# Patient Record
Sex: Female | Born: 1935
Health system: Southern US, Community
[De-identification: ages and names within clinical notes are randomized; demographics above are authoritative.]

## PROBLEM LIST (undated history)

## (undated) ENCOUNTER — Emergency Department (HOSPITAL_COMMUNITY): Admission: EM | Payer: Self-pay | Source: Home / Self Care

## (undated) DIAGNOSIS — F0390 Unspecified dementia without behavioral disturbance: Secondary | ICD-10-CM

## (undated) DIAGNOSIS — I10 Essential (primary) hypertension: Secondary | ICD-10-CM

## (undated) DIAGNOSIS — E119 Type 2 diabetes mellitus without complications: Secondary | ICD-10-CM

---

## 2007-06-05 ENCOUNTER — Emergency Department (HOSPITAL_COMMUNITY): Admission: EM | Admit: 2007-06-05 | Discharge: 2007-06-05 | Payer: Self-pay | Admitting: Family Medicine

## 2008-12-24 ENCOUNTER — Emergency Department (HOSPITAL_COMMUNITY): Admission: EM | Admit: 2008-12-24 | Discharge: 2008-12-24 | Payer: Self-pay | Admitting: Emergency Medicine

## 2009-01-11 ENCOUNTER — Encounter: Admission: RE | Admit: 2009-01-11 | Discharge: 2009-01-11 | Payer: Self-pay | Admitting: Family Medicine

## 2010-02-04 ENCOUNTER — Ambulatory Visit: Payer: Self-pay | Admitting: Vascular Surgery

## 2010-05-13 ENCOUNTER — Ambulatory Visit: Payer: Self-pay | Admitting: Vascular Surgery

## 2011-01-08 LAB — DIFFERENTIAL
Basophils Absolute: 0 10*3/uL (ref 0.0–0.1)
Basophils Relative: 0 % (ref 0–1)
Eosinophils Absolute: 0.1 10*3/uL (ref 0.0–0.7)
Monocytes Relative: 8 % (ref 3–12)
Neutrophils Relative %: 69 % (ref 43–77)

## 2011-01-08 LAB — COMPREHENSIVE METABOLIC PANEL
ALT: 12 U/L (ref 0–35)
Alkaline Phosphatase: 66 U/L (ref 39–117)
BUN: 35 mg/dL — ABNORMAL HIGH (ref 6–23)
CO2: 26 mEq/L (ref 19–32)
Chloride: 102 mEq/L (ref 96–112)
Glucose, Bld: 263 mg/dL — ABNORMAL HIGH (ref 70–99)
Potassium: 4.1 mEq/L (ref 3.5–5.1)
Sodium: 134 mEq/L — ABNORMAL LOW (ref 135–145)
Total Bilirubin: 0.9 mg/dL (ref 0.3–1.2)
Total Protein: 6.3 g/dL (ref 6.0–8.3)

## 2011-01-08 LAB — URINALYSIS, ROUTINE W REFLEX MICROSCOPIC
Bilirubin Urine: NEGATIVE
Hgb urine dipstick: NEGATIVE
Nitrite: POSITIVE — AB
Protein, ur: NEGATIVE mg/dL
Urobilinogen, UA: 1 mg/dL (ref 0.0–1.0)

## 2011-01-08 LAB — CBC
HCT: 39.3 % (ref 36.0–46.0)
Hemoglobin: 13.3 g/dL (ref 12.0–15.0)
RBC: 3.93 MIL/uL (ref 3.87–5.11)
RDW: 16.5 % — ABNORMAL HIGH (ref 11.5–15.5)
WBC: 7.1 10*3/uL (ref 4.0–10.5)

## 2011-02-10 NOTE — Consult Note (Signed)
NEW PATIENT CONSULTATION   Nicole Blake, Nicole Blake  DOB:  14-Feb-1915                                       02/04/2010  MVHQI#:[id number]   The patient is a 77 year old female referred by Dr. Suzette Battiest for  nonhealing ulcer on the left fourth toe.  The patient has had a hammer  toe left foot for many years and apparently has had this ulceration for  the past few months which has not healed.  It is unclear whether this  was caused by shoes which were tight or what other mechanisms.  She has  had no history of infection or gangrene of either lower extremity.  She  does ambulate a small amount while at home but is somewhat unsteady.   CHRONIC MEDICAL PROBLEMS:  1. Type 2 diabetes mellitus.  2. Hypertension.  3. Hammertoe left foot.  4. 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  5. Negative for coronary artery disease, COPD, hyperlipidemia or      stroke.   FAMILY HISTORY:  Negative for coronary artery disease, diabetes and  stroke.   SOCIAL HISTORY:  She has no children.  She is a retired Advertising copywriter.  Does not use tobacco or alcohol.   REVIEW OF SYSTEMS:  Negative for weight loss, anorexia, chest pain.  She  does have some dyspnea on exertion.  Does not ambulate long distances.  Denies any bronchitis, wheezing, GI or GU symptoms.  Does have blindness  in the left eye from her diabetes and had a failed laser treatment.  Decreased vision and hearing.  All other systems in review of systems  are negative.   PHYSICAL EXAM:  Vital signs:  Blood pressure 146/71, heart rate 59,  respirations 19.  General:  She is an elderly female in no apparent  distress.  She is alert and oriented x3.  HEENT:  Exam is normal.  EOMs  intact.  Chest:  Is clear to auscultation.  No rhonchi or wheezing.  Cardiovascular:  Regular rhythm.  No murmurs.  Carotid pulses 3+.  No  bruits.  Abdomen:  Soft, nontender with no masses.  Musculoskeletal:  Exam is free of major deformities.   Neurologic:  Exam had decreased  sensation in both feet.  Skin:  Free of rashes with the exception of the  ulcer on the left fourth toe.  Lower extremity exam reveals 3+ femoral,  popliteal pulse on the right and 3+ femoral pulse on the left.  Both  feet are adequately perfused.  The right foot has some superficial  abrasion over the right first metatarsal head medially.  The left foot  has an ulcer measuring about 8 mm in diameter on the anterior aspect of  the fourth toe which is a hammer toe and this has a superficial eschar  overlying it which was noninfected.  There is no surrounding cellulitis.  No other ulcers between the toes on the left foot.   I reviewed the clinical records provided by [facility] and Dr. Suzette Battiest and also reviewed the lower extremity arterial  Doppler report and pulse volume recordings done and other laboratory on  April 19 of this year.  Her ABI on the left is 0.7 and on the right is  1.28.   I think the patient does have left superficial femoral occlusive disease  with decreased  circulation of the left foot but not severe enough to  cause rest ischemia or limb loss.  I would not be inclined to do any  minor debridement or procedures on the left fourth toe as long as it  remains infected but rather treat this with padding of the foot.  Attempt at toe amputation may lead to failure to heal this and  ultimately could lead to an amputation of the extremity conceivably.  She is 77 years old and I would try to relieve her pain by other  symptomatic means such as padding of the shoes.     Quita Skye Hart Rochester, M.D.  Electronically Signed   JDL/MEDQ  D:  02/04/2010  T:  02/05/2010  Job:  [id number]   cc:   Dr Luciana Axe  Dr Suzette Battiest

## 2011-02-10 NOTE — Assessment & Plan Note (Signed)
OFFICE VISIT   [name], Nicole Blake  DOB:  01/14/1915                                       05/13/2010  EAVWU#:[id number]   The patient returns today having been evaluated by me for vascular  occlusive disease on May 10 of this year.  She was found to have tibial  occlusive disease with superficial ulcer on her left fourth toe, some  irritation along the medial aspect of her right first toe.  She has  received some treatments as [facility] and I am not sure  specifically what those consist of.  She has had no minor surgical  procedures.  She continues to have some aching discomfort in her feet  but states the feet have healed.  She has been avoiding any extrinsic  pressure to hopefully avoid pressure sores as we discussed at the last  visit.   PHYSICAL EXAM:  Today her blood pressure is 182/84, heart rate is 58,  respirations 16.  She continues to have 2+ femoral, 2+ popliteal pulses  bilaterally.  Both feet are adequately perfused.  The ulceration on the  anterior aspect of the left fourth toe has healed and the medial aspect  of the right first toe has also healed with no ulceration or infection.   Lower extremity arterial Doppler studies were ordered by me today and  reviewed.  ABI on the left is 1.21 and on the right is 0.93 with  evidence of calcified tibial vessels with biphasic flow.  She does not  have any need for further evaluation from a vascular standpoint.  I  would certainly avoid any type of toe amputations or incisions on her  feet at this may not heal and lead to an amputation.  I do not think she  requires any further treatments for her circulation and would be happy  to see her again on a p.r.n. basis.     Quita Skye Hart [place], M.D.  Electronically Signed   JDL/MEDQ  D:  05/13/2010  T:  05/14/2010  Job:  [id number]   cc:   Sherlynn Stalls

## 2011-10-08 DIAGNOSIS — I729 Aneurysm of unspecified site: Secondary | ICD-10-CM | POA: Diagnosis not present

## 2011-10-08 DIAGNOSIS — E1159 Type 2 diabetes mellitus with other circulatory complications: Secondary | ICD-10-CM | POA: Diagnosis not present

## 2011-10-08 DIAGNOSIS — L608 Other nail disorders: Secondary | ICD-10-CM | POA: Diagnosis not present

## 2011-11-10 DIAGNOSIS — T1510XA Foreign body in conjunctival sac, unspecified eye, initial encounter: Secondary | ICD-10-CM | POA: Diagnosis not present

## 2011-11-10 DIAGNOSIS — H4011X0 Primary open-angle glaucoma, stage unspecified: Secondary | ICD-10-CM | POA: Diagnosis not present

## 2011-12-29 DIAGNOSIS — H9190 Unspecified hearing loss, unspecified ear: Secondary | ICD-10-CM | POA: Diagnosis not present

## 2011-12-29 DIAGNOSIS — I10 Essential (primary) hypertension: Secondary | ICD-10-CM | POA: Diagnosis not present

## 2011-12-29 DIAGNOSIS — E1149 Type 2 diabetes mellitus with other diabetic neurological complication: Secondary | ICD-10-CM | POA: Diagnosis not present

## 2011-12-29 DIAGNOSIS — E782 Mixed hyperlipidemia: Secondary | ICD-10-CM | POA: Diagnosis not present

## 2012-01-25 DIAGNOSIS — R05 Cough: Secondary | ICD-10-CM | POA: Diagnosis not present

## 2012-02-02 DIAGNOSIS — H4011X0 Primary open-angle glaucoma, stage unspecified: Secondary | ICD-10-CM | POA: Diagnosis not present

## 2012-02-02 DIAGNOSIS — H2510 Age-related nuclear cataract, unspecified eye: Secondary | ICD-10-CM | POA: Diagnosis not present

## 2012-02-05 DIAGNOSIS — J209 Acute bronchitis, unspecified: Secondary | ICD-10-CM | POA: Diagnosis not present

## 2012-04-21 DIAGNOSIS — M79609 Pain in unspecified limb: Secondary | ICD-10-CM | POA: Diagnosis not present

## 2012-04-21 DIAGNOSIS — E1159 Type 2 diabetes mellitus with other circulatory complications: Secondary | ICD-10-CM | POA: Diagnosis not present

## 2012-04-21 DIAGNOSIS — B351 Tinea unguium: Secondary | ICD-10-CM | POA: Diagnosis not present

## 2012-04-21 DIAGNOSIS — I739 Peripheral vascular disease, unspecified: Secondary | ICD-10-CM | POA: Diagnosis not present

## 2012-04-21 DIAGNOSIS — L84 Corns and callosities: Secondary | ICD-10-CM | POA: Diagnosis not present

## 2012-04-26 DIAGNOSIS — H2510 Age-related nuclear cataract, unspecified eye: Secondary | ICD-10-CM | POA: Diagnosis not present

## 2012-04-26 DIAGNOSIS — H4011X0 Primary open-angle glaucoma, stage unspecified: Secondary | ICD-10-CM | POA: Diagnosis not present

## 2012-06-28 DIAGNOSIS — E1149 Type 2 diabetes mellitus with other diabetic neurological complication: Secondary | ICD-10-CM | POA: Diagnosis not present

## 2012-06-28 DIAGNOSIS — J309 Allergic rhinitis, unspecified: Secondary | ICD-10-CM | POA: Diagnosis not present

## 2012-06-28 DIAGNOSIS — G609 Hereditary and idiopathic neuropathy, unspecified: Secondary | ICD-10-CM | POA: Diagnosis not present

## 2012-06-28 DIAGNOSIS — I10 Essential (primary) hypertension: Secondary | ICD-10-CM | POA: Diagnosis not present

## 2012-06-28 DIAGNOSIS — Z23 Encounter for immunization: Secondary | ICD-10-CM | POA: Diagnosis not present

## 2012-07-14 DIAGNOSIS — L608 Other nail disorders: Secondary | ICD-10-CM | POA: Diagnosis not present

## 2012-07-14 DIAGNOSIS — I739 Peripheral vascular disease, unspecified: Secondary | ICD-10-CM | POA: Diagnosis not present

## 2012-07-14 DIAGNOSIS — E1159 Type 2 diabetes mellitus with other circulatory complications: Secondary | ICD-10-CM | POA: Diagnosis not present

## 2012-07-26 DIAGNOSIS — H2510 Age-related nuclear cataract, unspecified eye: Secondary | ICD-10-CM | POA: Diagnosis not present

## 2012-07-26 DIAGNOSIS — H4011X0 Primary open-angle glaucoma, stage unspecified: Secondary | ICD-10-CM | POA: Diagnosis not present

## 2012-10-10 DIAGNOSIS — I739 Peripheral vascular disease, unspecified: Secondary | ICD-10-CM | POA: Diagnosis not present

## 2012-10-10 DIAGNOSIS — E1159 Type 2 diabetes mellitus with other circulatory complications: Secondary | ICD-10-CM | POA: Diagnosis not present

## 2012-10-10 DIAGNOSIS — L608 Other nail disorders: Secondary | ICD-10-CM | POA: Diagnosis not present

## 2012-10-28 DIAGNOSIS — R059 Cough, unspecified: Secondary | ICD-10-CM | POA: Diagnosis not present

## 2012-10-28 DIAGNOSIS — R05 Cough: Secondary | ICD-10-CM | POA: Diagnosis not present

## 2012-11-16 DIAGNOSIS — H3530 Unspecified macular degeneration: Secondary | ICD-10-CM | POA: Diagnosis not present

## 2012-12-27 DIAGNOSIS — E1149 Type 2 diabetes mellitus with other diabetic neurological complication: Secondary | ICD-10-CM | POA: Diagnosis not present

## 2012-12-27 DIAGNOSIS — H9190 Unspecified hearing loss, unspecified ear: Secondary | ICD-10-CM | POA: Diagnosis not present

## 2012-12-27 DIAGNOSIS — I10 Essential (primary) hypertension: Secondary | ICD-10-CM | POA: Diagnosis not present

## 2012-12-27 DIAGNOSIS — J309 Allergic rhinitis, unspecified: Secondary | ICD-10-CM | POA: Diagnosis not present

## 2013-01-02 DIAGNOSIS — E1159 Type 2 diabetes mellitus with other circulatory complications: Secondary | ICD-10-CM | POA: Diagnosis not present

## 2013-01-02 DIAGNOSIS — L608 Other nail disorders: Secondary | ICD-10-CM | POA: Diagnosis not present

## 2013-01-02 DIAGNOSIS — I739 Peripheral vascular disease, unspecified: Secondary | ICD-10-CM | POA: Diagnosis not present

## 2013-03-15 DIAGNOSIS — H4011X0 Primary open-angle glaucoma, stage unspecified: Secondary | ICD-10-CM | POA: Diagnosis not present

## 2013-03-23 DIAGNOSIS — L608 Other nail disorders: Secondary | ICD-10-CM | POA: Diagnosis not present

## 2013-03-23 DIAGNOSIS — E1159 Type 2 diabetes mellitus with other circulatory complications: Secondary | ICD-10-CM | POA: Diagnosis not present

## 2013-03-23 DIAGNOSIS — I739 Peripheral vascular disease, unspecified: Secondary | ICD-10-CM | POA: Diagnosis not present

## 2013-05-20 DIAGNOSIS — J069 Acute upper respiratory infection, unspecified: Secondary | ICD-10-CM | POA: Diagnosis not present

## 2013-05-22 DIAGNOSIS — J309 Allergic rhinitis, unspecified: Secondary | ICD-10-CM | POA: Diagnosis not present

## 2013-05-22 DIAGNOSIS — G609 Hereditary and idiopathic neuropathy, unspecified: Secondary | ICD-10-CM | POA: Diagnosis not present

## 2013-05-22 DIAGNOSIS — R0989 Other specified symptoms and signs involving the circulatory and respiratory systems: Secondary | ICD-10-CM | POA: Diagnosis not present

## 2013-05-28 DIAGNOSIS — R05 Cough: Secondary | ICD-10-CM | POA: Diagnosis not present

## 2013-05-28 DIAGNOSIS — J45909 Unspecified asthma, uncomplicated: Secondary | ICD-10-CM | POA: Diagnosis not present

## 2013-07-04 DIAGNOSIS — Z23 Encounter for immunization: Secondary | ICD-10-CM | POA: Diagnosis not present

## 2013-07-12 DIAGNOSIS — H4011X0 Primary open-angle glaucoma, stage unspecified: Secondary | ICD-10-CM | POA: Diagnosis not present

## 2013-07-13 DIAGNOSIS — L608 Other nail disorders: Secondary | ICD-10-CM | POA: Diagnosis not present

## 2013-07-13 DIAGNOSIS — E1159 Type 2 diabetes mellitus with other circulatory complications: Secondary | ICD-10-CM | POA: Diagnosis not present

## 2013-07-13 DIAGNOSIS — I739 Peripheral vascular disease, unspecified: Secondary | ICD-10-CM | POA: Diagnosis not present

## 2013-10-24 DIAGNOSIS — L608 Other nail disorders: Secondary | ICD-10-CM | POA: Diagnosis not present

## 2013-10-24 DIAGNOSIS — I739 Peripheral vascular disease, unspecified: Secondary | ICD-10-CM | POA: Diagnosis not present

## 2013-11-15 DIAGNOSIS — H4011X0 Primary open-angle glaucoma, stage unspecified: Secondary | ICD-10-CM | POA: Diagnosis not present

## 2013-11-26 DIAGNOSIS — M79609 Pain in unspecified limb: Secondary | ICD-10-CM | POA: Diagnosis not present

## 2013-12-01 DIAGNOSIS — M25519 Pain in unspecified shoulder: Secondary | ICD-10-CM | POA: Diagnosis not present

## 2013-12-01 DIAGNOSIS — E119 Type 2 diabetes mellitus without complications: Secondary | ICD-10-CM | POA: Diagnosis not present

## 2013-12-18 DIAGNOSIS — M25519 Pain in unspecified shoulder: Secondary | ICD-10-CM | POA: Diagnosis not present

## 2014-01-02 DIAGNOSIS — M25519 Pain in unspecified shoulder: Secondary | ICD-10-CM | POA: Diagnosis not present

## 2014-01-08 DIAGNOSIS — M25519 Pain in unspecified shoulder: Secondary | ICD-10-CM | POA: Diagnosis not present

## 2014-01-09 DIAGNOSIS — L608 Other nail disorders: Secondary | ICD-10-CM | POA: Diagnosis not present

## 2014-01-09 DIAGNOSIS — I739 Peripheral vascular disease, unspecified: Secondary | ICD-10-CM | POA: Diagnosis not present

## 2014-01-09 DIAGNOSIS — M25519 Pain in unspecified shoulder: Secondary | ICD-10-CM | POA: Diagnosis not present

## 2014-01-10 DIAGNOSIS — M25519 Pain in unspecified shoulder: Secondary | ICD-10-CM | POA: Diagnosis not present

## 2014-01-15 DIAGNOSIS — Z5189 Encounter for other specified aftercare: Secondary | ICD-10-CM | POA: Diagnosis not present

## 2014-01-15 DIAGNOSIS — M129 Arthropathy, unspecified: Secondary | ICD-10-CM | POA: Diagnosis not present

## 2014-01-15 DIAGNOSIS — I10 Essential (primary) hypertension: Secondary | ICD-10-CM | POA: Diagnosis not present

## 2014-01-15 DIAGNOSIS — M25519 Pain in unspecified shoulder: Secondary | ICD-10-CM | POA: Diagnosis not present

## 2014-01-15 DIAGNOSIS — M6281 Muscle weakness (generalized): Secondary | ICD-10-CM | POA: Diagnosis not present

## 2014-01-15 DIAGNOSIS — F0390 Unspecified dementia without behavioral disturbance: Secondary | ICD-10-CM | POA: Diagnosis not present

## 2014-01-16 DIAGNOSIS — M129 Arthropathy, unspecified: Secondary | ICD-10-CM | POA: Diagnosis not present

## 2014-01-16 DIAGNOSIS — Z5189 Encounter for other specified aftercare: Secondary | ICD-10-CM | POA: Diagnosis not present

## 2014-01-16 DIAGNOSIS — M6281 Muscle weakness (generalized): Secondary | ICD-10-CM | POA: Diagnosis not present

## 2014-01-18 DIAGNOSIS — M129 Arthropathy, unspecified: Secondary | ICD-10-CM | POA: Diagnosis not present

## 2014-01-18 DIAGNOSIS — Z5189 Encounter for other specified aftercare: Secondary | ICD-10-CM | POA: Diagnosis not present

## 2014-01-18 DIAGNOSIS — M25519 Pain in unspecified shoulder: Secondary | ICD-10-CM | POA: Diagnosis not present

## 2014-01-18 DIAGNOSIS — F0390 Unspecified dementia without behavioral disturbance: Secondary | ICD-10-CM | POA: Diagnosis not present

## 2014-01-18 DIAGNOSIS — M6281 Muscle weakness (generalized): Secondary | ICD-10-CM | POA: Diagnosis not present

## 2014-01-18 DIAGNOSIS — I10 Essential (primary) hypertension: Secondary | ICD-10-CM | POA: Diagnosis not present

## 2014-01-23 DIAGNOSIS — M25519 Pain in unspecified shoulder: Secondary | ICD-10-CM | POA: Diagnosis not present

## 2014-01-23 DIAGNOSIS — M129 Arthropathy, unspecified: Secondary | ICD-10-CM | POA: Diagnosis not present

## 2014-01-23 DIAGNOSIS — F0390 Unspecified dementia without behavioral disturbance: Secondary | ICD-10-CM | POA: Diagnosis not present

## 2014-01-23 DIAGNOSIS — M6281 Muscle weakness (generalized): Secondary | ICD-10-CM | POA: Diagnosis not present

## 2014-01-23 DIAGNOSIS — Z5189 Encounter for other specified aftercare: Secondary | ICD-10-CM | POA: Diagnosis not present

## 2014-01-23 DIAGNOSIS — I10 Essential (primary) hypertension: Secondary | ICD-10-CM | POA: Diagnosis not present

## 2014-01-25 DIAGNOSIS — F0390 Unspecified dementia without behavioral disturbance: Secondary | ICD-10-CM | POA: Diagnosis not present

## 2014-01-25 DIAGNOSIS — M6281 Muscle weakness (generalized): Secondary | ICD-10-CM | POA: Diagnosis not present

## 2014-01-25 DIAGNOSIS — M25519 Pain in unspecified shoulder: Secondary | ICD-10-CM | POA: Diagnosis not present

## 2014-01-25 DIAGNOSIS — M129 Arthropathy, unspecified: Secondary | ICD-10-CM | POA: Diagnosis not present

## 2014-01-25 DIAGNOSIS — I10 Essential (primary) hypertension: Secondary | ICD-10-CM | POA: Diagnosis not present

## 2014-01-25 DIAGNOSIS — Z5189 Encounter for other specified aftercare: Secondary | ICD-10-CM | POA: Diagnosis not present

## 2014-01-30 DIAGNOSIS — Z5189 Encounter for other specified aftercare: Secondary | ICD-10-CM | POA: Diagnosis not present

## 2014-01-30 DIAGNOSIS — M25519 Pain in unspecified shoulder: Secondary | ICD-10-CM | POA: Diagnosis not present

## 2014-01-30 DIAGNOSIS — F0390 Unspecified dementia without behavioral disturbance: Secondary | ICD-10-CM | POA: Diagnosis not present

## 2014-01-30 DIAGNOSIS — M129 Arthropathy, unspecified: Secondary | ICD-10-CM | POA: Diagnosis not present

## 2014-01-30 DIAGNOSIS — M6281 Muscle weakness (generalized): Secondary | ICD-10-CM | POA: Diagnosis not present

## 2014-01-30 DIAGNOSIS — I10 Essential (primary) hypertension: Secondary | ICD-10-CM | POA: Diagnosis not present

## 2014-02-01 DIAGNOSIS — Z5189 Encounter for other specified aftercare: Secondary | ICD-10-CM | POA: Diagnosis not present

## 2014-02-01 DIAGNOSIS — I10 Essential (primary) hypertension: Secondary | ICD-10-CM | POA: Diagnosis not present

## 2014-02-01 DIAGNOSIS — F0390 Unspecified dementia without behavioral disturbance: Secondary | ICD-10-CM | POA: Diagnosis not present

## 2014-02-01 DIAGNOSIS — M25519 Pain in unspecified shoulder: Secondary | ICD-10-CM | POA: Diagnosis not present

## 2014-02-01 DIAGNOSIS — M129 Arthropathy, unspecified: Secondary | ICD-10-CM | POA: Diagnosis not present

## 2014-02-01 DIAGNOSIS — M6281 Muscle weakness (generalized): Secondary | ICD-10-CM | POA: Diagnosis not present

## 2014-02-02 DIAGNOSIS — F0390 Unspecified dementia without behavioral disturbance: Secondary | ICD-10-CM | POA: Diagnosis not present

## 2014-02-02 DIAGNOSIS — M25519 Pain in unspecified shoulder: Secondary | ICD-10-CM | POA: Diagnosis not present

## 2014-02-02 DIAGNOSIS — M129 Arthropathy, unspecified: Secondary | ICD-10-CM | POA: Diagnosis not present

## 2014-02-02 DIAGNOSIS — I10 Essential (primary) hypertension: Secondary | ICD-10-CM | POA: Diagnosis not present

## 2014-02-02 DIAGNOSIS — M6281 Muscle weakness (generalized): Secondary | ICD-10-CM | POA: Diagnosis not present

## 2014-02-02 DIAGNOSIS — Z5189 Encounter for other specified aftercare: Secondary | ICD-10-CM | POA: Diagnosis not present

## 2014-02-06 DIAGNOSIS — M129 Arthropathy, unspecified: Secondary | ICD-10-CM | POA: Diagnosis not present

## 2014-02-06 DIAGNOSIS — M6281 Muscle weakness (generalized): Secondary | ICD-10-CM | POA: Diagnosis not present

## 2014-02-06 DIAGNOSIS — I10 Essential (primary) hypertension: Secondary | ICD-10-CM | POA: Diagnosis not present

## 2014-02-06 DIAGNOSIS — M25519 Pain in unspecified shoulder: Secondary | ICD-10-CM | POA: Diagnosis not present

## 2014-02-06 DIAGNOSIS — F0390 Unspecified dementia without behavioral disturbance: Secondary | ICD-10-CM | POA: Diagnosis not present

## 2014-02-06 DIAGNOSIS — Z5189 Encounter for other specified aftercare: Secondary | ICD-10-CM | POA: Diagnosis not present

## 2014-02-08 DIAGNOSIS — M25519 Pain in unspecified shoulder: Secondary | ICD-10-CM | POA: Diagnosis not present

## 2014-02-08 DIAGNOSIS — I10 Essential (primary) hypertension: Secondary | ICD-10-CM | POA: Diagnosis not present

## 2014-02-08 DIAGNOSIS — M129 Arthropathy, unspecified: Secondary | ICD-10-CM | POA: Diagnosis not present

## 2014-02-08 DIAGNOSIS — M6281 Muscle weakness (generalized): Secondary | ICD-10-CM | POA: Diagnosis not present

## 2014-02-08 DIAGNOSIS — Z5189 Encounter for other specified aftercare: Secondary | ICD-10-CM | POA: Diagnosis not present

## 2014-02-08 DIAGNOSIS — F0390 Unspecified dementia without behavioral disturbance: Secondary | ICD-10-CM | POA: Diagnosis not present

## 2014-02-09 DIAGNOSIS — M129 Arthropathy, unspecified: Secondary | ICD-10-CM | POA: Diagnosis not present

## 2014-02-09 DIAGNOSIS — M6281 Muscle weakness (generalized): Secondary | ICD-10-CM | POA: Diagnosis not present

## 2014-02-09 DIAGNOSIS — Z5189 Encounter for other specified aftercare: Secondary | ICD-10-CM | POA: Diagnosis not present

## 2014-02-09 DIAGNOSIS — I10 Essential (primary) hypertension: Secondary | ICD-10-CM | POA: Diagnosis not present

## 2014-02-09 DIAGNOSIS — M25519 Pain in unspecified shoulder: Secondary | ICD-10-CM | POA: Diagnosis not present

## 2014-02-09 DIAGNOSIS — F0390 Unspecified dementia without behavioral disturbance: Secondary | ICD-10-CM | POA: Diagnosis not present

## 2014-02-12 DIAGNOSIS — F0390 Unspecified dementia without behavioral disturbance: Secondary | ICD-10-CM | POA: Diagnosis not present

## 2014-02-12 DIAGNOSIS — Z5189 Encounter for other specified aftercare: Secondary | ICD-10-CM | POA: Diagnosis not present

## 2014-02-12 DIAGNOSIS — M25519 Pain in unspecified shoulder: Secondary | ICD-10-CM | POA: Diagnosis not present

## 2014-02-12 DIAGNOSIS — M129 Arthropathy, unspecified: Secondary | ICD-10-CM | POA: Diagnosis not present

## 2014-02-12 DIAGNOSIS — I10 Essential (primary) hypertension: Secondary | ICD-10-CM | POA: Diagnosis not present

## 2014-02-12 DIAGNOSIS — M6281 Muscle weakness (generalized): Secondary | ICD-10-CM | POA: Diagnosis not present

## 2014-02-15 DIAGNOSIS — M25519 Pain in unspecified shoulder: Secondary | ICD-10-CM | POA: Diagnosis not present

## 2014-02-15 DIAGNOSIS — M6281 Muscle weakness (generalized): Secondary | ICD-10-CM | POA: Diagnosis not present

## 2014-02-15 DIAGNOSIS — M129 Arthropathy, unspecified: Secondary | ICD-10-CM | POA: Diagnosis not present

## 2014-02-15 DIAGNOSIS — I10 Essential (primary) hypertension: Secondary | ICD-10-CM | POA: Diagnosis not present

## 2014-02-15 DIAGNOSIS — F0390 Unspecified dementia without behavioral disturbance: Secondary | ICD-10-CM | POA: Diagnosis not present

## 2014-02-15 DIAGNOSIS — Z5189 Encounter for other specified aftercare: Secondary | ICD-10-CM | POA: Diagnosis not present

## 2014-02-20 DIAGNOSIS — F0390 Unspecified dementia without behavioral disturbance: Secondary | ICD-10-CM | POA: Diagnosis not present

## 2014-02-20 DIAGNOSIS — M129 Arthropathy, unspecified: Secondary | ICD-10-CM | POA: Diagnosis not present

## 2014-02-20 DIAGNOSIS — M25519 Pain in unspecified shoulder: Secondary | ICD-10-CM | POA: Diagnosis not present

## 2014-02-20 DIAGNOSIS — Z5189 Encounter for other specified aftercare: Secondary | ICD-10-CM | POA: Diagnosis not present

## 2014-02-20 DIAGNOSIS — I10 Essential (primary) hypertension: Secondary | ICD-10-CM | POA: Diagnosis not present

## 2014-02-20 DIAGNOSIS — M6281 Muscle weakness (generalized): Secondary | ICD-10-CM | POA: Diagnosis not present

## 2014-02-21 DIAGNOSIS — F0390 Unspecified dementia without behavioral disturbance: Secondary | ICD-10-CM | POA: Diagnosis not present

## 2014-02-21 DIAGNOSIS — E1149 Type 2 diabetes mellitus with other diabetic neurological complication: Secondary | ICD-10-CM | POA: Diagnosis not present

## 2014-02-21 DIAGNOSIS — I10 Essential (primary) hypertension: Secondary | ICD-10-CM | POA: Diagnosis not present

## 2014-02-21 DIAGNOSIS — R29898 Other symptoms and signs involving the musculoskeletal system: Secondary | ICD-10-CM | POA: Diagnosis not present

## 2014-02-21 DIAGNOSIS — G609 Hereditary and idiopathic neuropathy, unspecified: Secondary | ICD-10-CM | POA: Diagnosis not present

## 2014-02-22 DIAGNOSIS — I10 Essential (primary) hypertension: Secondary | ICD-10-CM | POA: Diagnosis not present

## 2014-02-22 DIAGNOSIS — M25519 Pain in unspecified shoulder: Secondary | ICD-10-CM | POA: Diagnosis not present

## 2014-02-22 DIAGNOSIS — M6281 Muscle weakness (generalized): Secondary | ICD-10-CM | POA: Diagnosis not present

## 2014-02-22 DIAGNOSIS — Z5189 Encounter for other specified aftercare: Secondary | ICD-10-CM | POA: Diagnosis not present

## 2014-02-22 DIAGNOSIS — M129 Arthropathy, unspecified: Secondary | ICD-10-CM | POA: Diagnosis not present

## 2014-02-22 DIAGNOSIS — F0390 Unspecified dementia without behavioral disturbance: Secondary | ICD-10-CM | POA: Diagnosis not present

## 2014-02-27 DIAGNOSIS — M25519 Pain in unspecified shoulder: Secondary | ICD-10-CM | POA: Diagnosis not present

## 2014-02-27 DIAGNOSIS — Z5189 Encounter for other specified aftercare: Secondary | ICD-10-CM | POA: Diagnosis not present

## 2014-02-27 DIAGNOSIS — F0390 Unspecified dementia without behavioral disturbance: Secondary | ICD-10-CM | POA: Diagnosis not present

## 2014-02-27 DIAGNOSIS — I10 Essential (primary) hypertension: Secondary | ICD-10-CM | POA: Diagnosis not present

## 2014-02-27 DIAGNOSIS — M6281 Muscle weakness (generalized): Secondary | ICD-10-CM | POA: Diagnosis not present

## 2014-02-27 DIAGNOSIS — M129 Arthropathy, unspecified: Secondary | ICD-10-CM | POA: Diagnosis not present

## 2014-03-01 DIAGNOSIS — M25519 Pain in unspecified shoulder: Secondary | ICD-10-CM | POA: Diagnosis not present

## 2014-03-01 DIAGNOSIS — Z5189 Encounter for other specified aftercare: Secondary | ICD-10-CM | POA: Diagnosis not present

## 2014-03-01 DIAGNOSIS — M6281 Muscle weakness (generalized): Secondary | ICD-10-CM | POA: Diagnosis not present

## 2014-03-01 DIAGNOSIS — I10 Essential (primary) hypertension: Secondary | ICD-10-CM | POA: Diagnosis not present

## 2014-03-01 DIAGNOSIS — M129 Arthropathy, unspecified: Secondary | ICD-10-CM | POA: Diagnosis not present

## 2014-03-01 DIAGNOSIS — F0390 Unspecified dementia without behavioral disturbance: Secondary | ICD-10-CM | POA: Diagnosis not present

## 2014-03-06 DIAGNOSIS — M25519 Pain in unspecified shoulder: Secondary | ICD-10-CM | POA: Diagnosis not present

## 2014-03-06 DIAGNOSIS — F0390 Unspecified dementia without behavioral disturbance: Secondary | ICD-10-CM | POA: Diagnosis not present

## 2014-03-06 DIAGNOSIS — Z5189 Encounter for other specified aftercare: Secondary | ICD-10-CM | POA: Diagnosis not present

## 2014-03-06 DIAGNOSIS — I10 Essential (primary) hypertension: Secondary | ICD-10-CM | POA: Diagnosis not present

## 2014-03-06 DIAGNOSIS — M129 Arthropathy, unspecified: Secondary | ICD-10-CM | POA: Diagnosis not present

## 2014-03-06 DIAGNOSIS — M6281 Muscle weakness (generalized): Secondary | ICD-10-CM | POA: Diagnosis not present

## 2014-03-08 DIAGNOSIS — M25519 Pain in unspecified shoulder: Secondary | ICD-10-CM | POA: Diagnosis not present

## 2014-03-08 DIAGNOSIS — Z5189 Encounter for other specified aftercare: Secondary | ICD-10-CM | POA: Diagnosis not present

## 2014-03-08 DIAGNOSIS — M129 Arthropathy, unspecified: Secondary | ICD-10-CM | POA: Diagnosis not present

## 2014-03-08 DIAGNOSIS — I10 Essential (primary) hypertension: Secondary | ICD-10-CM | POA: Diagnosis not present

## 2014-03-08 DIAGNOSIS — F0390 Unspecified dementia without behavioral disturbance: Secondary | ICD-10-CM | POA: Diagnosis not present

## 2014-03-08 DIAGNOSIS — M6281 Muscle weakness (generalized): Secondary | ICD-10-CM | POA: Diagnosis not present

## 2014-03-13 DIAGNOSIS — M6281 Muscle weakness (generalized): Secondary | ICD-10-CM | POA: Diagnosis not present

## 2014-03-13 DIAGNOSIS — M25519 Pain in unspecified shoulder: Secondary | ICD-10-CM | POA: Diagnosis not present

## 2014-03-13 DIAGNOSIS — Z5189 Encounter for other specified aftercare: Secondary | ICD-10-CM | POA: Diagnosis not present

## 2014-03-13 DIAGNOSIS — M129 Arthropathy, unspecified: Secondary | ICD-10-CM | POA: Diagnosis not present

## 2014-03-13 DIAGNOSIS — F0390 Unspecified dementia without behavioral disturbance: Secondary | ICD-10-CM | POA: Diagnosis not present

## 2014-03-13 DIAGNOSIS — I10 Essential (primary) hypertension: Secondary | ICD-10-CM | POA: Diagnosis not present

## 2014-03-15 DIAGNOSIS — M129 Arthropathy, unspecified: Secondary | ICD-10-CM | POA: Diagnosis not present

## 2014-03-15 DIAGNOSIS — M25519 Pain in unspecified shoulder: Secondary | ICD-10-CM | POA: Diagnosis not present

## 2014-03-15 DIAGNOSIS — Z5189 Encounter for other specified aftercare: Secondary | ICD-10-CM | POA: Diagnosis not present

## 2014-03-15 DIAGNOSIS — I10 Essential (primary) hypertension: Secondary | ICD-10-CM | POA: Diagnosis not present

## 2014-03-15 DIAGNOSIS — F0390 Unspecified dementia without behavioral disturbance: Secondary | ICD-10-CM | POA: Diagnosis not present

## 2014-03-15 DIAGNOSIS — M6281 Muscle weakness (generalized): Secondary | ICD-10-CM | POA: Diagnosis not present

## 2014-03-21 DIAGNOSIS — H4011X0 Primary open-angle glaucoma, stage unspecified: Secondary | ICD-10-CM | POA: Diagnosis not present

## 2014-04-04 ENCOUNTER — Encounter (INDEPENDENT_AMBULATORY_CARE_PROVIDER_SITE_OTHER): Payer: Self-pay

## 2014-04-04 ENCOUNTER — Other Ambulatory Visit: Payer: Self-pay | Admitting: Family Medicine

## 2014-04-04 ENCOUNTER — Ambulatory Visit
Admission: RE | Admit: 2014-04-04 | Discharge: 2014-04-04 | Disposition: A | Discharge status: Home / Self Care | Payer: Medicare Other | Source: Ambulatory Visit | Attending: Family Medicine | Admitting: Family Medicine

## 2014-04-04 DIAGNOSIS — S79919A Unspecified injury of unspecified hip, initial encounter: Secondary | ICD-10-CM | POA: Diagnosis not present

## 2014-04-04 DIAGNOSIS — S8990XA Unspecified injury of unspecified lower leg, initial encounter: Secondary | ICD-10-CM | POA: Diagnosis not present

## 2014-04-04 DIAGNOSIS — M25559 Pain in unspecified hip: Secondary | ICD-10-CM | POA: Diagnosis not present

## 2014-04-04 DIAGNOSIS — W19XXXA Unspecified fall, initial encounter: Secondary | ICD-10-CM

## 2014-04-04 DIAGNOSIS — IMO0002 Reserved for concepts with insufficient information to code with codable children: Secondary | ICD-10-CM | POA: Diagnosis not present

## 2014-04-04 DIAGNOSIS — M25569 Pain in unspecified knee: Secondary | ICD-10-CM | POA: Diagnosis not present

## 2014-04-04 DIAGNOSIS — S99919A Unspecified injury of unspecified ankle, initial encounter: Secondary | ICD-10-CM | POA: Diagnosis not present

## 2014-04-04 DIAGNOSIS — M549 Dorsalgia, unspecified: Secondary | ICD-10-CM | POA: Diagnosis not present

## 2014-04-04 DIAGNOSIS — S79929A Unspecified injury of unspecified thigh, initial encounter: Secondary | ICD-10-CM | POA: Diagnosis not present

## 2014-04-04 DIAGNOSIS — M79609 Pain in unspecified limb: Secondary | ICD-10-CM | POA: Diagnosis not present

## 2014-04-11 DIAGNOSIS — E1159 Type 2 diabetes mellitus with other circulatory complications: Secondary | ICD-10-CM | POA: Diagnosis not present

## 2014-04-11 DIAGNOSIS — L608 Other nail disorders: Secondary | ICD-10-CM | POA: Diagnosis not present

## 2014-04-11 DIAGNOSIS — I739 Peripheral vascular disease, unspecified: Secondary | ICD-10-CM | POA: Diagnosis not present

## 2014-04-13 DIAGNOSIS — I10 Essential (primary) hypertension: Secondary | ICD-10-CM | POA: Diagnosis not present

## 2014-04-13 DIAGNOSIS — G609 Hereditary and idiopathic neuropathy, unspecified: Secondary | ICD-10-CM | POA: Diagnosis not present

## 2014-04-13 DIAGNOSIS — E782 Mixed hyperlipidemia: Secondary | ICD-10-CM | POA: Diagnosis not present

## 2014-04-13 DIAGNOSIS — E1149 Type 2 diabetes mellitus with other diabetic neurological complication: Secondary | ICD-10-CM | POA: Diagnosis not present

## 2014-04-16 DIAGNOSIS — E782 Mixed hyperlipidemia: Secondary | ICD-10-CM | POA: Diagnosis not present

## 2014-04-16 DIAGNOSIS — I10 Essential (primary) hypertension: Secondary | ICD-10-CM | POA: Diagnosis not present

## 2014-04-16 DIAGNOSIS — Z23 Encounter for immunization: Secondary | ICD-10-CM | POA: Diagnosis not present

## 2014-04-16 DIAGNOSIS — G609 Hereditary and idiopathic neuropathy, unspecified: Secondary | ICD-10-CM | POA: Diagnosis not present

## 2014-04-16 DIAGNOSIS — E1149 Type 2 diabetes mellitus with other diabetic neurological complication: Secondary | ICD-10-CM | POA: Diagnosis not present

## 2014-04-16 DIAGNOSIS — E538 Deficiency of other specified B group vitamins: Secondary | ICD-10-CM | POA: Diagnosis not present

## 2014-04-20 DIAGNOSIS — E538 Deficiency of other specified B group vitamins: Secondary | ICD-10-CM | POA: Diagnosis not present

## 2014-04-23 DIAGNOSIS — S6980XA Other specified injuries of unspecified wrist, hand and finger(s), initial encounter: Secondary | ICD-10-CM | POA: Diagnosis not present

## 2014-04-23 DIAGNOSIS — S6990XA Unspecified injury of unspecified wrist, hand and finger(s), initial encounter: Secondary | ICD-10-CM | POA: Diagnosis not present

## 2014-05-18 DIAGNOSIS — E538 Deficiency of other specified B group vitamins: Secondary | ICD-10-CM | POA: Diagnosis not present

## 2014-06-11 DIAGNOSIS — R079 Chest pain, unspecified: Secondary | ICD-10-CM | POA: Diagnosis not present

## 2014-06-11 DIAGNOSIS — Z23 Encounter for immunization: Secondary | ICD-10-CM | POA: Diagnosis not present

## 2014-06-21 DIAGNOSIS — D510 Vitamin B12 deficiency anemia due to intrinsic factor deficiency: Secondary | ICD-10-CM | POA: Diagnosis not present

## 2014-06-23 DIAGNOSIS — J069 Acute upper respiratory infection, unspecified: Secondary | ICD-10-CM | POA: Diagnosis not present

## 2014-06-28 DIAGNOSIS — L603 Nail dystrophy: Secondary | ICD-10-CM | POA: Diagnosis not present

## 2014-06-28 DIAGNOSIS — I739 Peripheral vascular disease, unspecified: Secondary | ICD-10-CM | POA: Diagnosis not present

## 2014-06-28 DIAGNOSIS — M2010 Hallux valgus (acquired), unspecified foot: Secondary | ICD-10-CM | POA: Diagnosis not present

## 2014-06-28 DIAGNOSIS — M2040 Other hammer toe(s) (acquired), unspecified foot: Secondary | ICD-10-CM | POA: Diagnosis not present

## 2014-07-09 DIAGNOSIS — H2513 Age-related nuclear cataract, bilateral: Secondary | ICD-10-CM | POA: Diagnosis not present

## 2014-07-09 DIAGNOSIS — H4011X2 Primary open-angle glaucoma, moderate stage: Secondary | ICD-10-CM | POA: Diagnosis not present

## 2014-07-20 DIAGNOSIS — E538 Deficiency of other specified B group vitamins: Secondary | ICD-10-CM | POA: Diagnosis not present

## 2014-09-13 DIAGNOSIS — I739 Peripheral vascular disease, unspecified: Secondary | ICD-10-CM | POA: Diagnosis not present

## 2014-09-13 DIAGNOSIS — L603 Nail dystrophy: Secondary | ICD-10-CM | POA: Diagnosis not present

## 2014-09-13 DIAGNOSIS — E1151 Type 2 diabetes mellitus with diabetic peripheral angiopathy without gangrene: Secondary | ICD-10-CM | POA: Diagnosis not present

## 2014-09-19 DIAGNOSIS — E538 Deficiency of other specified B group vitamins: Secondary | ICD-10-CM | POA: Diagnosis not present

## 2014-10-17 DIAGNOSIS — E538 Deficiency of other specified B group vitamins: Secondary | ICD-10-CM | POA: Diagnosis not present

## 2014-11-02 IMAGING — CR DG HIP COMPLETE 2+V*R*
2 series · 2 of 2 positions shown · non-contrast
Comparison: Right femur 04/04/2014

CLINICAL DATA: Trauma.  History of fall with severe pain.

EXAM:
RIGHT HIP - COMPLETE 2+ VIEW

[t hip ap right]
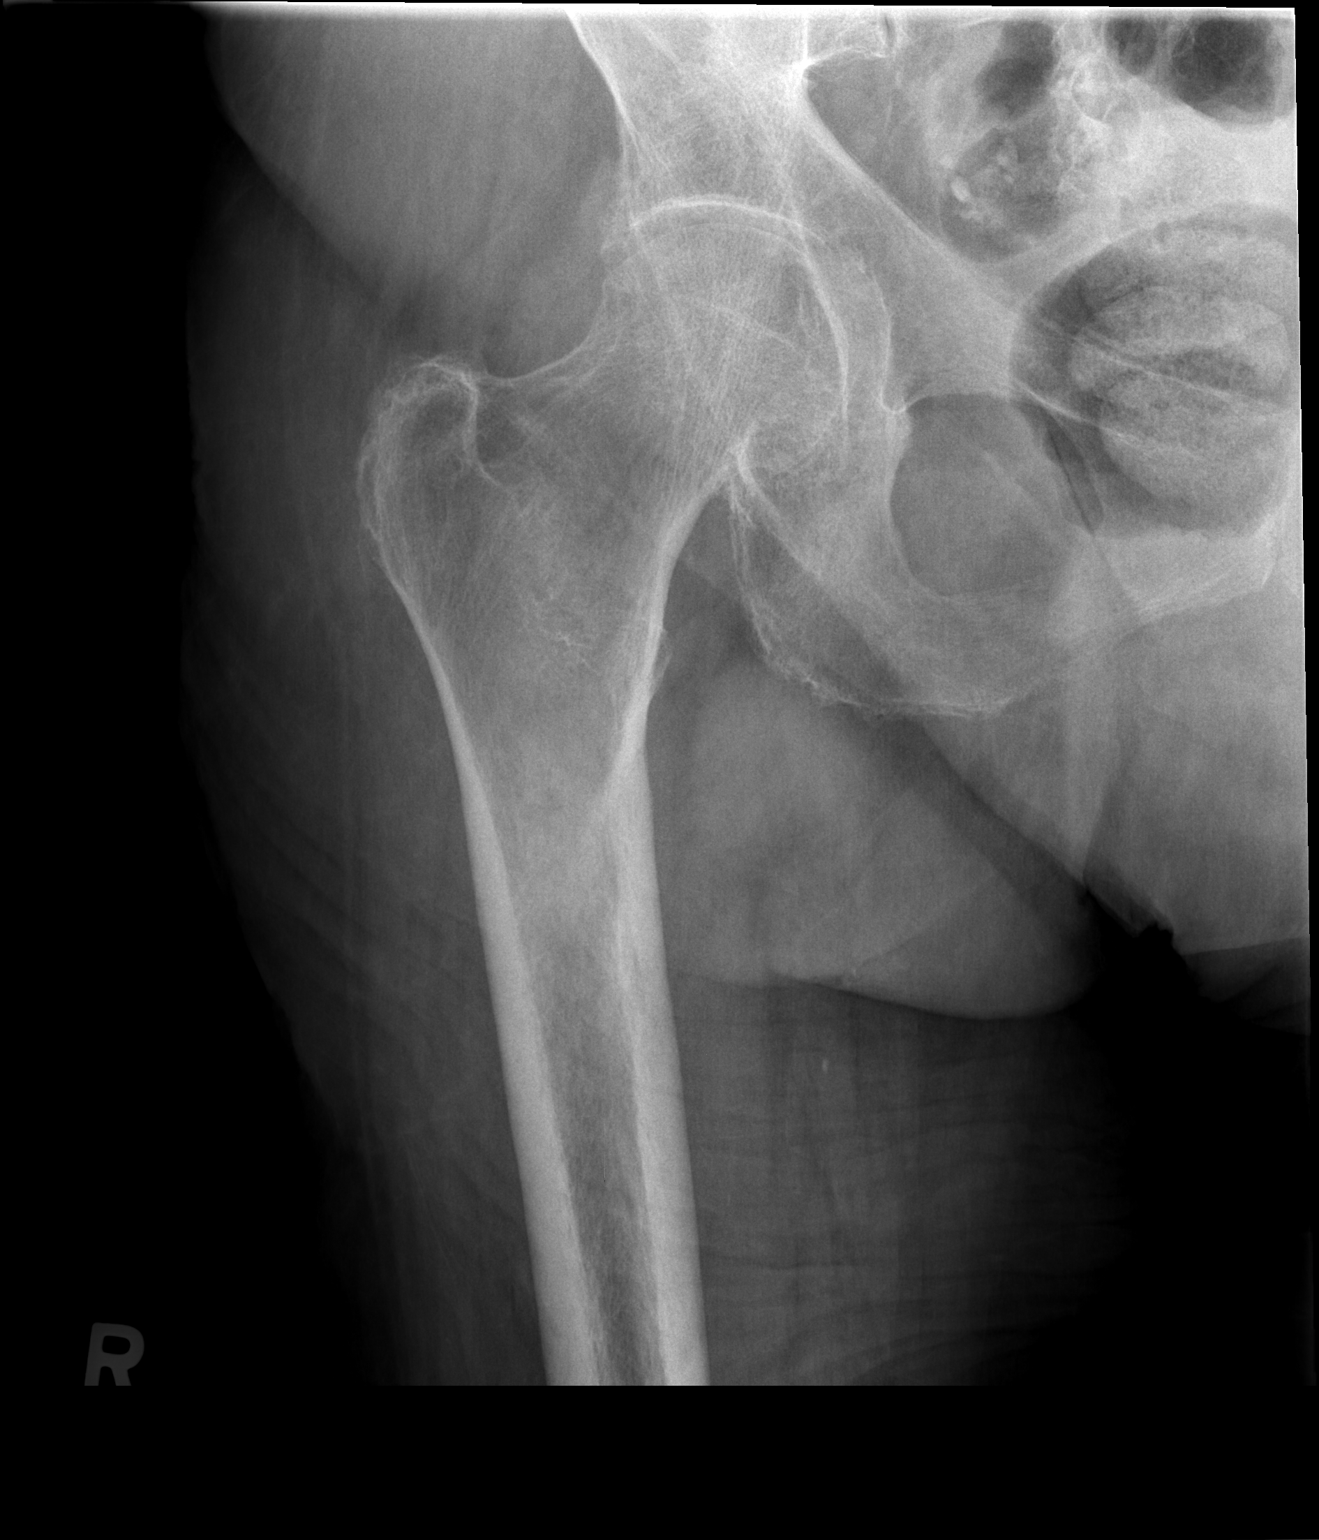

[t hip frog leg right]
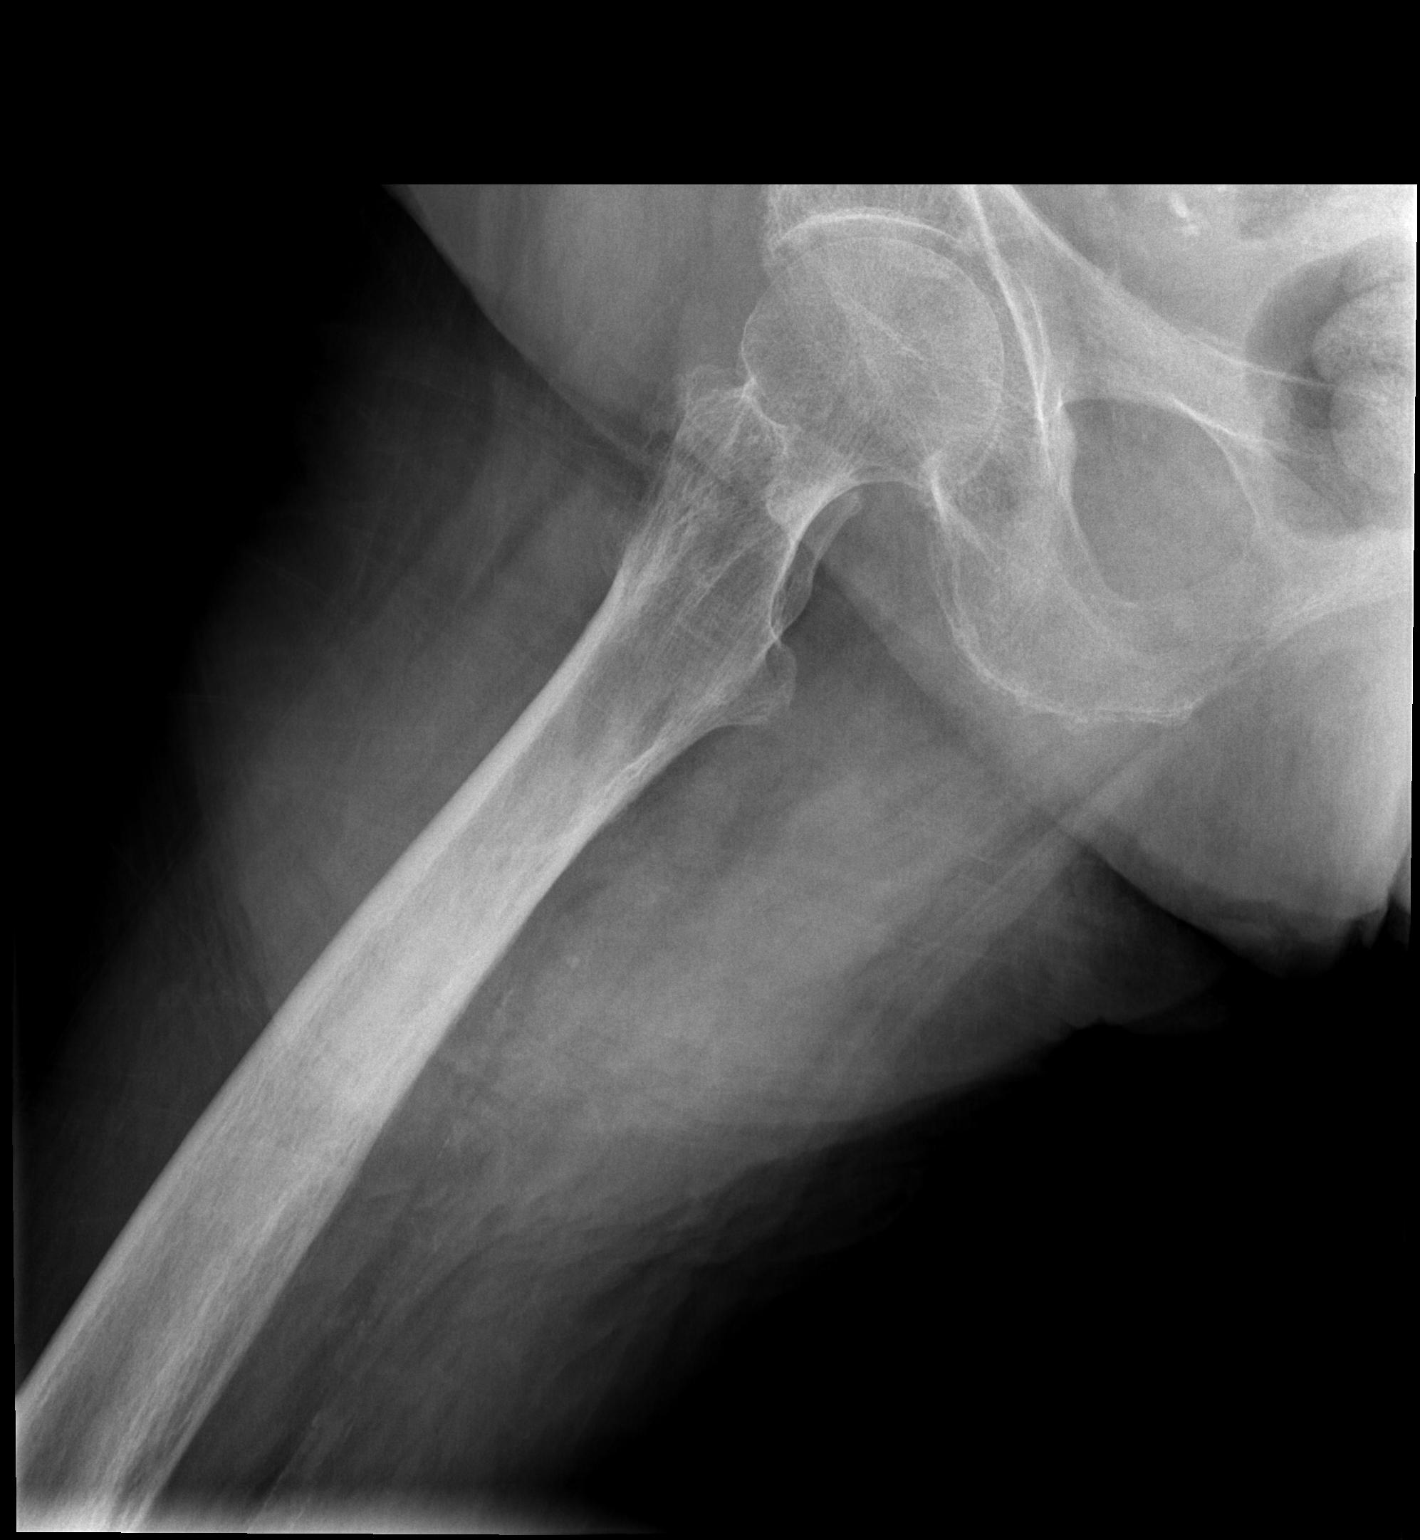

[2 of 2 positions shown; findings below may reference images not displayed]

FINDINGS: Two views of the right hip were obtained. There is no evidence for a
fracture or dislocation. There appears to be multiple calcifications
in the pelvis.
IMPRESSION: No acute bone abnormality to the right hip.

## 2014-11-13 DIAGNOSIS — E538 Deficiency of other specified B group vitamins: Secondary | ICD-10-CM | POA: Diagnosis not present

## 2014-11-13 DIAGNOSIS — E1142 Type 2 diabetes mellitus with diabetic polyneuropathy: Secondary | ICD-10-CM | POA: Diagnosis not present

## 2014-11-14 DIAGNOSIS — H2513 Age-related nuclear cataract, bilateral: Secondary | ICD-10-CM | POA: Diagnosis not present

## 2014-11-14 DIAGNOSIS — H4011X2 Primary open-angle glaucoma, moderate stage: Secondary | ICD-10-CM | POA: Diagnosis not present

## 2014-11-22 DIAGNOSIS — I739 Peripheral vascular disease, unspecified: Secondary | ICD-10-CM | POA: Diagnosis not present

## 2014-11-22 DIAGNOSIS — L603 Nail dystrophy: Secondary | ICD-10-CM | POA: Diagnosis not present

## 2014-11-22 DIAGNOSIS — E1151 Type 2 diabetes mellitus with diabetic peripheral angiopathy without gangrene: Secondary | ICD-10-CM | POA: Diagnosis not present

## 2014-12-05 ENCOUNTER — Observation Stay (HOSPITAL_COMMUNITY): Payer: Medicare Other

## 2014-12-05 ENCOUNTER — Emergency Department (HOSPITAL_COMMUNITY): Payer: Medicare Other

## 2014-12-05 ENCOUNTER — Encounter (HOSPITAL_COMMUNITY): Payer: Self-pay | Admitting: Emergency Medicine

## 2014-12-05 ENCOUNTER — Inpatient Hospital Stay (HOSPITAL_COMMUNITY)
Admission: EM | Admit: 2014-12-05 | Discharge: 2014-12-09 | DRG: 178 | Disposition: A | Discharge status: Home Health | Payer: Medicare Other | Attending: Internal Medicine | Admitting: Internal Medicine

## 2014-12-05 DIAGNOSIS — E119 Type 2 diabetes mellitus without complications: Secondary | ICD-10-CM | POA: Diagnosis present

## 2014-12-05 DIAGNOSIS — R1111 Vomiting without nausea: Secondary | ICD-10-CM | POA: Diagnosis not present

## 2014-12-05 DIAGNOSIS — J189 Pneumonia, unspecified organism: Secondary | ICD-10-CM | POA: Diagnosis present

## 2014-12-05 DIAGNOSIS — Z9049 Acquired absence of other specified parts of digestive tract: Secondary | ICD-10-CM | POA: Diagnosis present

## 2014-12-05 DIAGNOSIS — N179 Acute kidney failure, unspecified: Secondary | ICD-10-CM | POA: Diagnosis present

## 2014-12-05 DIAGNOSIS — R0600 Dyspnea, unspecified: Secondary | ICD-10-CM

## 2014-12-05 DIAGNOSIS — R197 Diarrhea, unspecified: Secondary | ICD-10-CM | POA: Diagnosis present

## 2014-12-05 DIAGNOSIS — T378X5A Adverse effect of other specified systemic anti-infectives and antiparasitics, initial encounter: Secondary | ICD-10-CM | POA: Diagnosis not present

## 2014-12-05 DIAGNOSIS — R0902 Hypoxemia: Secondary | ICD-10-CM | POA: Diagnosis present

## 2014-12-05 DIAGNOSIS — R0602 Shortness of breath: Secondary | ICD-10-CM

## 2014-12-05 DIAGNOSIS — I10 Essential (primary) hypertension: Secondary | ICD-10-CM | POA: Diagnosis present

## 2014-12-05 DIAGNOSIS — R1110 Vomiting, unspecified: Secondary | ICD-10-CM | POA: Diagnosis present

## 2014-12-05 DIAGNOSIS — J690 Pneumonitis due to inhalation of food and vomit: Principal | ICD-10-CM | POA: Diagnosis present

## 2014-12-05 DIAGNOSIS — R062 Wheezing: Secondary | ICD-10-CM

## 2014-12-05 DIAGNOSIS — R112 Nausea with vomiting, unspecified: Secondary | ICD-10-CM | POA: Diagnosis present

## 2014-12-05 DIAGNOSIS — K429 Umbilical hernia without obstruction or gangrene: Secondary | ICD-10-CM | POA: Diagnosis not present

## 2014-12-05 DIAGNOSIS — J9811 Atelectasis: Secondary | ICD-10-CM | POA: Diagnosis not present

## 2014-12-05 DIAGNOSIS — D72829 Elevated white blood cell count, unspecified: Secondary | ICD-10-CM | POA: Diagnosis not present

## 2014-12-05 DIAGNOSIS — E872 Acidosis: Secondary | ICD-10-CM | POA: Diagnosis present

## 2014-12-05 DIAGNOSIS — Y92239 Unspecified place in hospital as the place of occurrence of the external cause: Secondary | ICD-10-CM

## 2014-12-05 DIAGNOSIS — R21 Rash and other nonspecific skin eruption: Secondary | ICD-10-CM | POA: Diagnosis not present

## 2014-12-05 DIAGNOSIS — R05 Cough: Secondary | ICD-10-CM | POA: Diagnosis not present

## 2014-12-05 DIAGNOSIS — R109 Unspecified abdominal pain: Secondary | ICD-10-CM

## 2014-12-05 HISTORY — DX: Type 2 diabetes mellitus without complications: E11.9

## 2014-12-05 HISTORY — DX: Essential (primary) hypertension: I10

## 2014-12-05 LAB — COMPREHENSIVE METABOLIC PANEL
ALT: 11 U/L (ref 0–35)
ALT: 11 U/L (ref 0–35)
ANION GAP: 12 (ref 5–15)
AST: 19 U/L (ref 0–37)
AST: 21 U/L (ref 0–37)
Albumin: 3.6 g/dL (ref 3.5–5.2)
Albumin: 3.3 g/dL — ABNORMAL LOW (ref 3.5–5.2)
Alkaline Phosphatase: 67 U/L (ref 39–117)
Alkaline Phosphatase: 59 U/L (ref 39–117)
Anion gap: 9 (ref 5–15)
BUN: 29 mg/dL — ABNORMAL HIGH (ref 6–23)
BUN: 30 mg/dL — AB (ref 6–23)
CALCIUM: 8.2 mg/dL — AB (ref 8.4–10.5)
CHLORIDE: 107 mmol/L (ref 96–112)
CO2: 19 mmol/L (ref 19–32)
CO2: 21 mmol/L (ref 19–32)
Calcium: 8.8 mg/dL (ref 8.4–10.5)
Chloride: 103 mmol/L (ref 96–112)
Creatinine, Ser: 1.4 mg/dL — ABNORMAL HIGH (ref 0.50–1.10)
Creatinine, Ser: 1.3 mg/dL — ABNORMAL HIGH (ref 0.50–1.10)
GFR calc Af Amer: 35 mL/min — ABNORMAL LOW (ref >90)
GFR, EST AFRICAN AMERICAN: 39 mL/min — AB (ref >90)
GFR, EST NON AFRICAN AMERICAN: 30 mL/min — AB (ref >90)
GFR, EST NON AFRICAN AMERICAN: 33 mL/min — AB (ref >90)
GLUCOSE: 270 mg/dL — AB (ref 70–99)
Glucose, Bld: 232 mg/dL — ABNORMAL HIGH (ref 70–99)
POTASSIUM: 4 mmol/L (ref 3.5–5.1)
POTASSIUM: 3.7 mmol/L (ref 3.5–5.1)
SODIUM: 134 mmol/L — AB (ref 135–145)
Sodium: 137 mmol/L (ref 135–145)
Total Bilirubin: 0.6 mg/dL (ref 0.3–1.2)
Total Bilirubin: 0.4 mg/dL (ref 0.3–1.2)
Total Protein: 6.2 g/dL (ref 6.0–8.3)
Total Protein: 5.9 g/dL — ABNORMAL LOW (ref 6.0–8.3)

## 2014-12-05 LAB — CBG MONITORING, ED: Glucose-Capillary: 232 mg/dL — ABNORMAL HIGH (ref 70–99)

## 2014-12-05 LAB — LACTIC ACID, PLASMA
LACTIC ACID, VENOUS: 4.3 mmol/L — AB (ref 0.5–2.0)
Lactic Acid, Venous: 4.9 mmol/L (ref 0.5–2.0)

## 2014-12-05 LAB — CBC WITH DIFFERENTIAL/PLATELET
BASOS ABS: 0 10*3/uL (ref 0.0–0.1)
Basophils Absolute: 0 10*3/uL (ref 0.0–0.1)
Basophils Relative: 0 % (ref 0–1)
Basophils Relative: 0 % (ref 0–1)
EOS ABS: 0 10*3/uL (ref 0.0–0.7)
EOS PCT: 1 % (ref 0–5)
EOS PCT: 0 % (ref 0–5)
Eosinophils Absolute: 0.1 10*3/uL (ref 0.0–0.7)
HCT: 37.3 % (ref 36.0–46.0)
HEMATOCRIT: 39.9 % (ref 36.0–46.0)
HEMOGLOBIN: 13 g/dL (ref 12.0–15.0)
Hemoglobin: 12 g/dL (ref 12.0–15.0)
LYMPHS ABS: 1.6 10*3/uL (ref 0.7–4.0)
LYMPHS PCT: 12 % (ref 12–46)
Lymphocytes Relative: 2 % — ABNORMAL LOW (ref 12–46)
Lymphs Abs: 0.3 10*3/uL — ABNORMAL LOW (ref 0.7–4.0)
MCH: 29 pg (ref 26.0–34.0)
MCH: 28.7 pg (ref 26.0–34.0)
MCHC: 32.6 g/dL (ref 30.0–36.0)
MCHC: 32.2 g/dL (ref 30.0–36.0)
MCV: 89.1 fL (ref 78.0–100.0)
MCV: 89.2 fL (ref 78.0–100.0)
MONO ABS: 0.8 10*3/uL (ref 0.1–1.0)
MONOS PCT: 7 % (ref 3–12)
Monocytes Absolute: 0.8 10*3/uL (ref 0.1–1.0)
Monocytes Relative: 6 % (ref 3–12)
NEUTROS ABS: 10.7 10*3/uL — AB (ref 1.7–7.7)
NEUTROS PCT: 81 % — AB (ref 43–77)
Neutro Abs: 10.8 10*3/uL — ABNORMAL HIGH (ref 1.7–7.7)
Neutrophils Relative %: 91 % — ABNORMAL HIGH (ref 43–77)
PLATELETS: 153 10*3/uL (ref 150–400)
Platelets: 139 10*3/uL — ABNORMAL LOW (ref 150–400)
RBC: 4.48 MIL/uL (ref 3.87–5.11)
RBC: 4.18 MIL/uL (ref 3.87–5.11)
RDW: 13.5 % (ref 11.5–15.5)
RDW: 13.6 % (ref 11.5–15.5)
WBC: 13.2 10*3/uL — ABNORMAL HIGH (ref 4.0–10.5)
WBC: 11.9 10*3/uL — AB (ref 4.0–10.5)

## 2014-12-05 LAB — STREP PNEUMONIAE URINARY ANTIGEN: Strep Pneumo Urinary Antigen: NEGATIVE

## 2014-12-05 LAB — I-STAT CG4 LACTIC ACID, ED
LACTIC ACID, VENOUS: 2.55 mmol/L — AB (ref 0.5–2.0)
Lactic Acid, Venous: 3.97 mmol/L (ref 0.5–2.0)

## 2014-12-05 LAB — GLUCOSE, CAPILLARY
GLUCOSE-CAPILLARY: 234 mg/dL — AB (ref 70–99)
GLUCOSE-CAPILLARY: 176 mg/dL — AB (ref 70–99)
GLUCOSE-CAPILLARY: 106 mg/dL — AB (ref 70–99)
Glucose-Capillary: 123 mg/dL — ABNORMAL HIGH (ref 70–99)
Glucose-Capillary: 92 mg/dL (ref 70–99)

## 2014-12-05 LAB — HIV ANTIBODY (ROUTINE TESTING W REFLEX): HIV Screen 4th Generation wRfx: NONREACTIVE

## 2014-12-05 LAB — LIPASE, BLOOD: LIPASE: 79 U/L — AB (ref 11–59)

## 2014-12-05 LAB — TROPONIN I

## 2014-12-05 MED ORDER — LEVOFLOXACIN IN D5W 750 MG/150ML IV SOLN: 750.0000 mg | INTRAVENOUS | Status: DC

## 2014-12-05 MED ORDER — IOHEXOL 300 MG/ML  SOLN: 50.0000 mL | Freq: Once | INTRAMUSCULAR | Status: AC | PRN

## 2014-12-05 MED ORDER — ALBUTEROL SULFATE (2.5 MG/3ML) 0.083% IN NEBU
2.5000 mg | INHALATION_SOLUTION | Freq: Once | RESPIRATORY_TRACT | Status: AC
  Administered 2014-12-05: 2.5 mg via RESPIRATORY_TRACT
  Filled 2014-12-05: qty 3

## 2014-12-05 MED ORDER — ONDANSETRON HCL 4 MG/2ML IJ SOLN
4.0000 mg | Freq: Once | INTRAMUSCULAR | Status: AC
  Administered 2014-12-05: 4 mg via INTRAVENOUS
  Filled 2014-12-05: qty 2

## 2014-12-05 MED ORDER — INSULIN ASPART 100 UNIT/ML ~~LOC~~ SOLN
0.0000 [IU] | SUBCUTANEOUS | Status: DC
  Administered 2014-12-05: 2 [IU] via SUBCUTANEOUS
  Administered 2014-12-05: 3 [IU] via SUBCUTANEOUS
  Administered 2014-12-05: 1 [IU] via SUBCUTANEOUS
  Administered 2014-12-07 (×2): 2 [IU] via SUBCUTANEOUS
  Administered 2014-12-08: 1 [IU] via SUBCUTANEOUS
  Administered 2014-12-08 – 2014-12-09 (×3): 2 [IU] via SUBCUTANEOUS
  Filled 2014-12-05: qty 1

## 2014-12-05 MED ORDER — PANTOPRAZOLE SODIUM 40 MG PO TBEC
40.0000 mg | DELAYED_RELEASE_TABLET | Freq: Every day | ORAL | Status: DC
  Administered 2014-12-05 – 2014-12-09 (×5): 40 mg via ORAL
  Filled 2014-12-05 (×5): qty 1

## 2014-12-05 MED ORDER — HEPARIN SODIUM (PORCINE) 5000 UNIT/ML IJ SOLN
5000.0000 [IU] | Freq: Three times a day (TID) | INTRAMUSCULAR | Status: DC
  Administered 2014-12-05 – 2014-12-09 (×12): 5000 [IU] via SUBCUTANEOUS
  Filled 2014-12-05 (×13): qty 1

## 2014-12-05 MED ORDER — DIPHENHYDRAMINE HCL 50 MG/ML IJ SOLN
12.5000 mg | Freq: Four times a day (QID) | INTRAMUSCULAR | Status: DC | PRN
  Administered 2014-12-05: 12.5 mg via INTRAVENOUS
  Filled 2014-12-05: qty 1

## 2014-12-05 MED ORDER — LEVOFLOXACIN IN D5W 750 MG/150ML IV SOLN
750.0000 mg | INTRAVENOUS | Status: DC
  Administered 2014-12-05: 750 mg via INTRAVENOUS
  Filled 2014-12-05: qty 150

## 2014-12-05 MED ORDER — SODIUM CHLORIDE 0.9 % IV BOLUS (SEPSIS)
1000.0000 mL | Freq: Once | INTRAVENOUS | Status: AC
  Administered 2014-12-05: 1000 mL via INTRAVENOUS

## 2014-12-05 MED ORDER — IPRATROPIUM-ALBUTEROL 0.5-2.5 (3) MG/3ML IN SOLN
3.0000 mL | Freq: Once | RESPIRATORY_TRACT | Status: AC
  Administered 2014-12-05: 3 mL via RESPIRATORY_TRACT
  Filled 2014-12-05: qty 3

## 2014-12-05 MED ORDER — LEVOFLOXACIN IN D5W 500 MG/100ML IV SOLN: 500.0000 mg | INTRAVENOUS | Status: DC

## 2014-12-05 MED ORDER — SODIUM CHLORIDE 0.9 % IV SOLN
1.5000 g | Freq: Two times a day (BID) | INTRAVENOUS | Status: DC
  Administered 2014-12-05 – 2014-12-07 (×4): 1.5 g via INTRAVENOUS
  Filled 2014-12-05 (×5): qty 1.5

## 2014-12-05 MED ORDER — ONDANSETRON HCL 4 MG/2ML IJ SOLN
4.0000 mg | Freq: Four times a day (QID) | INTRAMUSCULAR | Status: DC | PRN
  Administered 2014-12-05: 4 mg via INTRAVENOUS
  Filled 2014-12-05: qty 2

## 2014-12-05 MED ORDER — SODIUM CHLORIDE 0.9 % IV SOLN
INTRAVENOUS | Status: DC
Start: 2014-12-05 — End: 2014-12-06
  Administered 2014-12-05: 1000 mL via INTRAVENOUS
  Administered 2014-12-05 – 2014-12-06 (×3): via INTRAVENOUS

## 2014-12-05 MED ORDER — ONDANSETRON HCL 4 MG PO TABS: 4.0000 mg | ORAL_TABLET | Freq: Four times a day (QID) | ORAL | Status: DC | PRN

## 2014-12-05 NOTE — H&P (Addendum)
Hospitalist Admission History and Physical  Patient name: Nicole FeeMartha J Blake Medical record number: [id number] Date of birth: 05/19/1915 Age: 81 y.o. Gender: female  Primary Care Provider:  Duane Lopeoss, Alan, MD  Chief Complaint: emesis History of Present Illness:This is a 81 y.o. year old female with significant past medical history of HTN, type 2 DM presenting with emesis. Per the family, patient was very light milk prior to going Bible study tonight. Family states the patient tolerated food without much incident. One 2 hours later, patient developed subjective nausea and had 2-3 episodes of nonbilious nonbloody emesis. Patient had mild coughing with episode. Family does report patient has had persistent cough over the last one 2 months. Intermittently has seasonal inhaler. Presented to the ER temperature 97.9, heart rate in the 90s to 110s, respirations in the tens to 30s, blood pressure in the 110s to 170s, satting 98% on room air though did have 1 episode of hypoxia with O2 sat 88% on room air. White blood cell count 13.2, hemoglobin 13, creatinine 1.4, sodium 134. Troponin negative 1. Glucose 232. KUB within normals. No evidence of free air. Chest x-ray with no active disease.  Assessment and Plan:  Active Problems:   Nausea and vomiting   Emesis   1-emesis -unclear etiology -KUB WNL  -ddx includes reflux, obstruction, esophageal issue among others -CT abd and pelvis given abd pain  -CAP coverage given aspiration risk. (noted transient hypoxia and leukocytosis) -panculture -repeat CXR  -SLP consult  -NPO, PPI  2- AKI -baseline Cr 1.2 -Cr near baseline today, though dry clinically  -hydrate -follow  FEN/GI: NPo  Prophylaxis:sub q heparin  Disposition: pending further evaluation  Code Status:Full Code    Patient Active Problem List   Diagnosis Date Noted  . Nausea and vomiting 12/05/2014  . Emesis 12/05/2014   Past Medical History: Past Medical History  Diagnosis Date  .  Hypertension   . Diabetes mellitus without complication     Past Surgical History: History reviewed. No pertinent past surgical history.  Social History: History   Social History  . Marital Status: Widowed    Spouse Name: N/A  . Number of Children: N/A  . Years of Education: N/A   Social History Main Topics  . Smoking status: Never Smoker   . Smokeless tobacco: Not on file  . Alcohol Use: No  . Drug Use: Not on file  . Sexual Activity: Not on file   Other Topics Concern  . None   Social History Narrative  . None    Family History: No family history on file.  Allergies: No Known Allergies  Current Facility-Administered Medications  Medication Dose Route Frequency Provider Last Rate Last Dose  . 0.9 %  sodium chloride infusion   Intravenous Continuous Floydene FlockSteven J Newton, MD      . heparin injection 5,000 Units  5,000 Units Subcutaneous 3 times per day Floydene FlockSteven J Newton, MD      . iohexol (OMNIPAQUE) 300 MG/ML solution 50 mL  50 mL Oral Once PRN Medication Radiologist, MD      . levofloxacin (LEVAQUIN) IVPB 750 mg  750 mg Intravenous Q24H Floydene FlockSteven J Newton, MD      . ondansetron [facility](ZOFRAN) tablet 4 mg  4 mg Oral Q6H PRN Floydene FlockSteven J Newton, MD       Or  . ondansetron [facility](ZOFRAN) injection 4 mg  4 mg Intravenous Q6H PRN Floydene FlockSteven J Newton, MD      . pantoprazole (PROTONIX) EC tablet 40 mg  40  mg Oral Daily Floydene Flock, MD       No current outpatient prescriptions on file.   Review Of Systems: 12 point ROS negative except as noted above in HPI.  Physical Exam: Filed Vitals:   12/05/14 0400  BP: 119/56  Pulse: 106  Temp:   Resp: 21    General: somnolent, sleeping, mildly cachectic  HEENT: PERRLA, extra ocular movement intact and dry oral mucosa Heart: S1, S2 normal, no murmur, rub or gallop, regular rate and rhythm Lungs: unlabored breathing and bibasilar rales Abdomen: + bowel sounds, mild abd TTP diffusely Extremities: extremities normal, atraumatic, no cyanosis or  edema Skin:no rashes Neurology:uncooperative to exam   Labs and Imaging: Lab Results  Component Value Date/Time   NA 134* 12/05/2014 01:20 AM   K 4.0 12/05/2014 01:20 AM   CL 103 12/05/2014 01:20 AM   CO2 19 12/05/2014 01:20 AM   BUN 29* 12/05/2014 01:20 AM   CREATININE 1.40* 12/05/2014 01:20 AM   GLUCOSE 232* 12/05/2014 01:20 AM   Lab Results  Component Value Date   WBC 13.2* 12/05/2014   HGB 13.0 12/05/2014   HCT 39.9 12/05/2014   MCV 89.1 12/05/2014   PLT 153 12/05/2014         Dg Chest Port 1 View  12/05/2014   CLINICAL DATA:  Vomiting tonight. Cough and shortness of breath and wheezing.  EXAM: PORTABLE CHEST - 1 VIEW  COMPARISON:  None.  FINDINGS: Shallow inspiration. The heart size and mediastinal contours are within normal limits. Both lungs are clear. The visualized skeletal structures are unremarkable.  IMPRESSION: No active disease.   Electronically Signed   By: Burman Nieves M.D.   On: 12/05/2014 01:47   Dg Abd Portable 1v  12/05/2014   CLINICAL DATA:  81 year old female with nausea and vomiting for 1 day.  EXAM: PORTABLE ABDOMEN - 1 VIEW  COMPARISON:  None.  FINDINGS: Single upright view focused on the upper abdomen demonstrates no free intra-abdominal air. No definite dilated bowel loops. There are surgical clips in the right upper quadrant of the abdomen. Right lateral and lower abdomen are not included in the field of view.  IMPRESSION: No free intra-abdominal air.  No evidence of bowel obstruction.   Electronically Signed   By: Rubye Oaks M.D.   On: 12/05/2014 01:45           Doree Albee MD  Pager: [phone]

## 2014-12-05 NOTE — Evaluation (Signed)
Clinical/Bedside Swallow Evaluation Patient Details  Name: Nicole Blake MRN: [id number] Date of Birth: 06/22/1915  Today's Date: 12/05/2014 Time: SLP Start Time (ACUTE ONLY): 1025 SLP Stop Time (ACUTE ONLY): 1045 SLP Time Calculation (min) (ACUTE ONLY): 20 min  Past Medical History:  Past Medical History  Diagnosis Date  . Hypertension   . Diabetes mellitus without complication    Past Surgical History: History reviewed. No pertinent past surgical history. HPI:  Patient is a 81 year old female with significant past medical history of HTN, type 2 DM presenting with emesis and a mild coughing with episode. Family does report patient has had persistent cough over the last few months and intermittently uses an inhaler.   Assessment / Plan / Recommendation Clinical Impression  Orders recieved; bedside swallow evaluation completed.  Patient presents with grossly intact oral motor abilites and loose fitting dentures that appear to impact mastication of regular textures, resulting in oral residue that requires liquid washes to clear.  Given the absence of new neurological findings and no overt s/s of aspiration recommend initiation of Dys.3 (soft solids) and thin liquids with full supervision to cues for portion control, pacing and positioning to decrease risk of aspiration.   Niece present at bedside and SLP provided education regarding recommended safe swallow strategies; niece verbalized understanding.  Of note, if issues continue MD may want to consult GI given presence of belching during bedside, which may indicate esophageal issues.       Aspiration Risk  Mild    Diet Recommendation Dysphagia 3 (Mechanical Soft);Thin liquid   Liquid Administration via: Cup;Straw Medication Administration: Whole meds with liquid Supervision: Patient able to self feed;Full supervision/cueing for compensatory strategies Compensations: Slow rate;Small sips/bites;Check for pocketing Postural Changes and/or  Swallow Maneuvers: Out of bed for meals;Upright 30-60 min after meal    Other  Recommendations Oral Care Recommendations: Oral care BID   Follow Up Recommendations  Other (comment) (TBD)    Frequency and Duration min 2x/week  1 week   Pertinent Vitals/Pain None    SLP Swallow Goals  See care plan    Swallow Study Prior Functional Status   Regular textures and thin liquids     General Date of Onset: 12/04/14 HPI: Patient is a 81 year old female with significant past medical history of HTN, type 2 DM presenting with emesis and a mild coughing with episode. Family does report patient has had persistent cough over the last few months and intermittently uses an inhaler. Type of Study: Bedside swallow evaluation Previous Swallow Assessment: none on record Diet Prior to this Study: NPO Temperature Spikes Noted: No Respiratory Status: Nasal cannula History of Recent Intubation: No Behavior/Cognition: Alert;Cooperative;Hard of hearing Oral Cavity - Dentition: Dentures, top;Dentures, bottom Self-Feeding Abilities: Able to feed self Patient Positioning: Upright in bed Baseline Vocal Quality: Low vocal intensity Volitional Cough: Congested Volitional Swallow: Able to elicit    Oral/Motor/Sensory Function Overall Oral Motor/Sensory Function: Appears within functional limits for tasks assessed   Ice Chips Ice chips: Within functional limits   Thin Liquid Thin Liquid: Impaired Presentation: Cup;Straw Pharyngeal  Phase Impairments: Multiple swallows Other Comments: cues for small sips effectively reduced number of swallows per bolus    Nectar Thick Nectar Thick Liquid: Not tested   Honey Thick Honey Thick Liquid: Not tested   Puree Puree: Within functional limits Presentation: Self Fed;Spoon   Solid   GO    Solid: Impaired Presentation: Self Fed Oral Phase Impairments: Impaired mastication Oral Phase Functional Implications: Oral  residue;Other (comment) (prolonged oral phase  ) Other Comments: thin liquids effectively reduced oral residue       [name], M.A., CCC-SLP [phone]   [name] 12/05/2014,11:06 AM

## 2014-12-05 NOTE — Progress Notes (Signed)
UR completed 

## 2014-12-05 NOTE — ED Provider Notes (Signed)
CSN: [id number]     Arrival date & time 12/05/14  0049 History  This chart was scribed for Marisa Severin, MD by Bronson Curb, ED Scribe. This patient was seen in room D36C/D36C and the patient's care was started at 1:18 AM.   Chief Complaint  Patient presents with  . Nausea    The history is provided by the patient and a relative. No language interpreter was used.     HPI Comments: PAXTYN WISDOM is a 81 y.o. female, with history of HTN and DM, who presents to the Emergency Department complaining of nausea with intermittent vomiting that began PTA. Family notes symptoms started PTA, after eating. Family states the patient complained of feel nauseous after eating. They report she ate salad with vinaigrette dressing and suspect this to be the cause of her symptoms, stating that she has never had this type of dressing. Family notes a total of 3 episodes of vomiting PTA and describes the contents as clear, brownish liquid. Patient has past surgical history of cholecystectomy. Family also reports persistent cough for past 2 weeks. Family also notes wheezing that was not present prior to vomiting. No sick contacts. She was seen by PCP and was prescribed an inhaler to use as needed. Patient it actively vomiting at this time.  PCP: Tenny Craw at [facility]  Past Medical History  Diagnosis Date  . Hypertension   . Diabetes mellitus without complication    History reviewed. No pertinent past surgical history. No family history on file. History  Substance Use Topics  . Smoking status: Never Smoker   . Smokeless tobacco: Not on file  . Alcohol Use: No   OB History    No data available     Review of Systems  Respiratory: Positive for cough and wheezing.   Gastrointestinal: Positive for nausea and vomiting.  All other systems reviewed and are negative.     Allergies  Review of patient's allergies indicates no known allergies.  Home Medications   Prior to Admission medications   Not on File    Triage Vitals: BP 163/79 mmHg  Pulse 101  Temp(Src) 97.9 F (36.6 C) (Oral)  Resp 20  Wt 139 lb (63.05 kg)  SpO2 88%  Physical Exam  Constitutional: She is oriented to person, place, and time. No distress.  Frail.  HENT:  Head: Normocephalic and atraumatic.  Nose: Nose normal.  Mouth/Throat: Oropharynx is clear and moist.  Eyes: Conjunctivae and EOM are normal.  Neck: Normal range of motion. Neck supple. No tracheal deviation present.  Cardiovascular: Normal rate, regular rhythm, normal heart sounds and intact distal pulses.  Exam reveals no gallop and no friction rub.   No murmur heard. Pulmonary/Chest: Effort normal. No respiratory distress. She has wheezes. She has no rales. She exhibits no tenderness.  Coarse breath sounds. Expiratory wheeze.  Abdominal: Soft. She exhibits no distension and no mass. Bowel sounds are increased. There is no tenderness. There is no rebound and no guarding.  Nontender. Hyperactive bowel sounds. Actively vomiting during exam.  Musculoskeletal: Normal range of motion. She exhibits no edema or tenderness.  Neurological: She is alert and oriented to person, place, and time.  Skin: Skin is warm and dry.  Psychiatric: She has a normal mood and affect. Her behavior is normal.  Nursing note and vitals reviewed.   ED Course  Procedures (including critical care time)  DIAGNOSTIC STUDIES: Oxygen Saturation is 88% on room air, low by my interpretation.    COORDINATION OF CARE: At  [id number] Discussed treatment plan with patient which includes CXR. Patient agrees.   At 0249 Discussed negative imaging results and high WBC with family. Informed family the patient will be admitted. Patient is still wheezing at this time. Will order additional breathing treatment. Family agrees.  Labs Review Labs Reviewed  COMPREHENSIVE METABOLIC PANEL - Abnormal; Notable for the following:    Sodium 134 (*)    Glucose, Bld 232 (*)    BUN 29 (*)    Creatinine, Ser 1.40  (*)    GFR calc non Af Amer 30 (*)    GFR calc Af Amer 35 (*)    All other components within normal limits  CBC WITH DIFFERENTIAL/PLATELET - Abnormal; Notable for the following:    WBC 13.2 (*)    Neutrophils Relative % 81 (*)    Neutro Abs 10.7 (*)    All other components within normal limits  LIPASE, BLOOD - Abnormal; Notable for the following:    Lipase 79 (*)    All other components within normal limits  I-STAT CG4 LACTIC ACID, ED - Abnormal; Notable for the following:    Lactic Acid, Venous 2.55 (*)    All other components within normal limits  TROPONIN I    Imaging Review Dg Chest Port 1 View  12/05/2014   CLINICAL DATA:  Vomiting tonight. Cough and shortness of breath and wheezing.  EXAM: PORTABLE CHEST - 1 VIEW  COMPARISON:  None.  FINDINGS: Shallow inspiration. The heart size and mediastinal contours are within normal limits. Both lungs are clear. The visualized skeletal structures are unremarkable.  IMPRESSION: No active disease.   Electronically Signed   By: Burman NievesWilliam  Stevens M.D.   On: 12/05/2014 01:47   Dg Abd Portable 1v  12/05/2014   CLINICAL DATA:  [age] year old female with nausea and vomiting for 1 day.  EXAM: PORTABLE ABDOMEN - 1 VIEW  COMPARISON:  None.  FINDINGS: Single upright view focused on the upper abdomen demonstrates no free intra-abdominal air. No definite dilated bowel loops. There are surgical clips in the right upper quadrant of the abdomen. Right lateral and lower abdomen are not included in the field of view.  IMPRESSION: No free intra-abdominal air.  No evidence of bowel obstruction.   Electronically Signed   By: Rubye OaksMelanie  Ehinger M.D.   On: 12/05/2014 01:45     EKG Interpretation   Date/Time:  Wednesday December 05 2014 01:04:20 EST Ventricular Rate:  102 PR Interval:  195 QRS Duration: 90 QT Interval:  360 QTC Calculation: 469 R Axis:   74 Text Interpretation:  Sinus tachycardia Since last tracing rate faster  Otherwise no significant change Confirmed  by Venezia Sargeant  MD, Marimar Suber ([id number]) on  12/05/2014 1:15:26 AM      MDM   Final diagnoses:  Nausea and vomiting  Wheezing  Hypoxia  Aspiration pneumonia, unspecified aspiration pneumonia type    [age] year old female with acute onset of nausea vomiting tonight followed by cough and wheeze.  She has had some baseline cough for the last 2 weeks but is now persistent with a wheeze.  Patient received albuterol inhaler with some improvement after home.  She arrives here hypoxic.  I'm concern for possible aspiration pneumonia.  Unclear cause for vomiting.  No signs of bowel obstruction, no sick contacts, no complaint of abdominal pain.  Case discussed with hospitalist, who wishes antibiotic coverage.  Family updated on need for close observation and monitoring for new hypoxia and wheezing  I personally performed the services described in  this documentation, which was scribed in my presence. The recorded information has been reviewed and is accurate.     Marisa Severin, MD 12/05/14 [phone]

## 2014-12-05 NOTE — ED Notes (Signed)
Pt presents with nausea after eating this evening- family reports pt has vomited multiple times.  Pt has had dry cough for the past 2 weeks as well.  Pt states "I just don't feel good".

## 2014-12-05 NOTE — Progress Notes (Signed)
Patient had 3rd loose stool ,now placed on enteric precautions.Education  Reviewed with family member and patient.

## 2014-12-05 NOTE — Progress Notes (Signed)
CRITICAL VALUE ALERT  Critical value received:  Lactic acid 4.9  Date of notification:  12/05/14  Time of notification:  1300  Critical value read back:Yes.    Nurse who received alert:  Patsey Berthold. Kamden Reber, RN  MD notified (1st page):  Dr. Benjamine MolaVann  Time of first page:  1303  MD notified (2nd page):  Time of second page:  Responding MD:  Dr. Benjamine MolaVann  Time MD responded:  [phone]

## 2014-12-05 NOTE — Progress Notes (Signed)
ANTIBIOTIC CONSULT NOTE - INITIAL  Pharmacy Consult for Unasyn Indication: Empiric aspiration PNA coverage  Allergies  Allergen Reactions  . Robitussin (Alcohol Free) [Guaifenesin] Other (See Comments)    Stomach upset  . Sulfa Antibiotics     pts niece believes she may not be able to take this    Patient Measurements: Weight: 143 lb 8.3 oz (65.1 kg)  Vital Signs: Temp: 98 F (36.7 C) (03/09 0814) Temp Source: Oral (03/09 0814) BP: 126/52 mmHg (03/09 0610) Pulse Rate: 98 (03/09 0814) Intake/Output from previous day:   Intake/Output from this shift: Total I/O In: 673.3 [I.V.:523.3; IV Piggyback:150] Out: 1 [Urine:1]  Labs:  Recent Labs  12/05/14 0120 12/05/14 0455  WBC 13.2* 11.9*  HGB 13.0 12.0  PLT 153 139*  CREATININE 1.40* 1.30*   CrCl cannot be calculated (Unknown ideal weight.). No results for input(s): VANCOTROUGH, VANCOPEAK, VANCORANDOM, GENTTROUGH, GENTPEAK, GENTRANDOM, TOBRATROUGH, TOBRAPEAK, TOBRARND, AMIKACINPEAK, AMIKACINTROU, AMIKACIN in the last 72 hours.   Microbiology: No results found for this or any previous visit (from the past 720 hour(s)).  Medical History: Past Medical History  Diagnosis Date  . Hypertension   . Diabetes mellitus without complication     Assessment: [age] YOF who presented on N/V and "not feeling well" and was noted to have cough, wheezing, and hypoxia and was started on Levaquin for empiric PNA coverage. The patient received one dose and a rash was noticed by the RN today - it is unclear if this is related to the Levaquin however plans are now to transition the patient to Unasyn for empiric aspiration PNA coverage. SCr 1.3, CrCl~20-25 ml/min.   Goal of Therapy:  Proper antibiotics for infection/cultures adjusted for renal/hepatic function   Plan:  1. Start Unasyn 1.5g IV every 12 hours 2. Will continue to follow renal function, culture results, LOT, and antibiotic de-escalation plans   Georgina PillionElizabeth Janyce Ellinger, PharmD,  BCPS Clinical Pharmacist Pager: [phone] 12/05/2014 11:33 AM

## 2014-12-05 NOTE — Progress Notes (Addendum)
Patient admitted after midnight.  Appears to have a PNA on CT scan of abd.  No acute abdominal issues.  Await SLP eval for diet.  Family denies fevers at home.   CAP possible aspiration -IV Abx -SLP eval  emesis -related to PNA?   AKI -baseline Cr 1.2 -Cr near baseline today, though dry clinically  -hydrate -monitor  Lactic acidosis -IVF and recheck  Lives with family at home  Marlin CanaryJessica Dayvon Dax DO

## 2014-12-05 NOTE — Progress Notes (Addendum)
Admission note:  Arrival Method: via stretcher from ED with tech Mental Orientation: A&Ox4 Telemetry: placed on box 6e01, ST Assessment: see doc flowshet Skin: skin tear on mid sacrum-barrier cream placed, heels red but blanchable - foam placed, bottom red but blanchable - barrier cream placed, bruises on legs bilaterally  IV: right FA IV Pain: none Safety Measures: Placed on bed alarm.  Yellow socks on.  Instructed to use call bell for assistance.  Verbalized understanding.  6E Orientation: Patient has been oriented to the unit, staff and to the room.  Family: Daughter at bedside   Orders have been reviewed and implemented.  Will continue to monitor.

## 2014-12-05 NOTE — Progress Notes (Signed)
CRITICAL VALUE ALERT  Critical value received:  Lactic acid 4.3  Date of notification:  12/05/2014  Time of notification:  0657  Critical value read back:Yes.    Nurse who received alert:  Rozann Leschesebecca Hakiem Malizia  MD notified (1st page):  Donnamarie PoagK. Kirby NP  Time of first page:  [phone]

## 2014-12-06 DIAGNOSIS — J189 Pneumonia, unspecified organism: Secondary | ICD-10-CM

## 2014-12-06 DIAGNOSIS — D72829 Elevated white blood cell count, unspecified: Secondary | ICD-10-CM

## 2014-12-06 DIAGNOSIS — R112 Nausea with vomiting, unspecified: Secondary | ICD-10-CM

## 2014-12-06 LAB — LEGIONELLA ANTIGEN, URINE

## 2014-12-06 LAB — GLUCOSE, CAPILLARY
GLUCOSE-CAPILLARY: 83 mg/dL (ref 70–99)
GLUCOSE-CAPILLARY: 108 mg/dL — AB (ref 70–99)
GLUCOSE-CAPILLARY: 92 mg/dL (ref 70–99)
Glucose-Capillary: 80 mg/dL (ref 70–99)
Glucose-Capillary: 89 mg/dL (ref 70–99)
Glucose-Capillary: 92 mg/dL (ref 70–99)

## 2014-12-06 LAB — CBC WITH DIFFERENTIAL/PLATELET
BASOS ABS: 0 10*3/uL (ref 0.0–0.1)
Basophils Relative: 0 % (ref 0–1)
EOS ABS: 0.1 10*3/uL (ref 0.0–0.7)
Eosinophils Relative: 2 % (ref 0–5)
HEMATOCRIT: 34.6 % — AB (ref 36.0–46.0)
HEMOGLOBIN: 11.2 g/dL — AB (ref 12.0–15.0)
Lymphocytes Relative: 8 % — ABNORMAL LOW (ref 12–46)
Lymphs Abs: 0.4 10*3/uL — ABNORMAL LOW (ref 0.7–4.0)
MCH: 28.6 pg (ref 26.0–34.0)
MCHC: 32.4 g/dL (ref 30.0–36.0)
MCV: 88.3 fL (ref 78.0–100.0)
Monocytes Absolute: 0.5 10*3/uL (ref 0.1–1.0)
Monocytes Relative: 10 % (ref 3–12)
NEUTROS PCT: 80 % — AB (ref 43–77)
Neutro Abs: 4.1 10*3/uL (ref 1.7–7.7)
PLATELETS: 112 10*3/uL — AB (ref 150–400)
RBC: 3.92 MIL/uL (ref 3.87–5.11)
RDW: 14.1 % (ref 11.5–15.5)
WBC: 5.1 10*3/uL (ref 4.0–10.5)

## 2014-12-06 LAB — COMPREHENSIVE METABOLIC PANEL
ALBUMIN: 2.5 g/dL — AB (ref 3.5–5.2)
ALT: 11 U/L (ref 0–35)
AST: 18 U/L (ref 0–37)
Alkaline Phosphatase: 43 U/L (ref 39–117)
Anion gap: 5 (ref 5–15)
BILIRUBIN TOTAL: 0.5 mg/dL (ref 0.3–1.2)
BUN: 21 mg/dL (ref 6–23)
CO2: 21 mmol/L (ref 19–32)
CREATININE: 1.18 mg/dL — AB (ref 0.50–1.10)
Calcium: 7.7 mg/dL — ABNORMAL LOW (ref 8.4–10.5)
Chloride: 111 mmol/L (ref 96–112)
GFR calc Af Amer: 43 mL/min — ABNORMAL LOW (ref >90)
GFR calc non Af Amer: 37 mL/min — ABNORMAL LOW (ref >90)
GLUCOSE: 87 mg/dL (ref 70–99)
Potassium: 4.4 mmol/L (ref 3.5–5.1)
Sodium: 137 mmol/L (ref 135–145)
Total Protein: 4.9 g/dL — ABNORMAL LOW (ref 6.0–8.3)

## 2014-12-06 LAB — HEMOGLOBIN A1C
Hgb A1c MFr Bld: 6.1 % — ABNORMAL HIGH (ref 4.8–5.6)
MEAN PLASMA GLUCOSE: 128 mg/dL

## 2014-12-06 LAB — LACTIC ACID, PLASMA: Lactic Acid, Venous: 0.8 mmol/L (ref 0.5–2.0)

## 2014-12-06 LAB — CLOSTRIDIUM DIFFICILE BY PCR: CDIFFPCR: NEGATIVE

## 2014-12-06 MED ORDER — ALBUTEROL SULFATE (2.5 MG/3ML) 0.083% IN NEBU
2.5000 mg | INHALATION_SOLUTION | RESPIRATORY_TRACT | Status: DC | PRN
  Administered 2014-12-06 – 2014-12-09 (×3): 2.5 mg via RESPIRATORY_TRACT
  Filled 2014-12-06 (×3): qty 3

## 2014-12-06 MED ORDER — LATANOPROST 0.005 % OP SOLN
1.0000 [drp] | Freq: Every evening | OPHTHALMIC | Status: DC
  Administered 2014-12-06 – 2014-12-08 (×3): 1 [drp] via OPHTHALMIC
  Filled 2014-12-06: qty 2.5

## 2014-12-06 MED ORDER — CETYLPYRIDINIUM CHLORIDE 0.05 % MT LIQD
7.0000 mL | Freq: Two times a day (BID) | OROMUCOSAL | Status: DC
Start: 2014-12-06 — End: 2014-12-09
  Administered 2014-12-06 – 2014-12-09 (×6): 7 mL via OROMUCOSAL

## 2014-12-06 MED ORDER — LATANOPROST 0.005 % OP SOLN
1.0000 [drp] | Freq: Every evening | OPHTHALMIC | Status: DC
  Filled 2014-12-06: qty 2.5

## 2014-12-06 NOTE — Progress Notes (Signed)
C diff PCR came back negative. Enteric precaution discontinued.

## 2014-12-06 NOTE — Progress Notes (Signed)
Speech Language Pathology Treatment: Dysphagia  Patient Details Name: Nicole Blake MRN: [id number] DOB: 01/31/1915 Today's Date: 12/06/2014 Time: [id number] SLP Time Calculation (min) (ACUTE ONLY): 10 min  Assessment / Plan / Recommendation Clinical Impression  Skilled treatment session focused on addressing dysphagia goals.  Patient consumed advanced trials of regular textures and thin liquids via straw with intermittent coughs that were present at baseline.  Patient demonstrated more timely mastication and oral clearance as compared to yesterday's evaluation and as a result, recommend diet be advanced to regular textures and thin liquids with set-up assist and then intermittent supervision.  Recommend SLP follow up x1 to ensure toleration and wrap up of education with niece, who is primary caregiver.       HPI HPI: Patient is a 81 year old female with significant past medical history of HTN, type 2 DM presenting with emesis and a mild coughing with episode. Family does report patient has had persistent cough over the last few months and intermittently uses an inhaler.   Pertinent Vitals Pain Assessment: No/denies pain  SLP Plan  Continue with current plan of care    Recommendations Diet recommendations: Regular;Thin liquid Liquids provided via: Cup;Straw Medication Administration: Whole meds with liquid Supervision: Patient able to self feed;Intermittent supervision to cue for compensatory strategies (set up assist ) Compensations: Slow rate;Small sips/bites Postural Changes and/or Swallow Maneuvers: Seated upright 90 degrees;Upright 30-60 min after meal              Oral Care Recommendations: Oral care BID Follow up Recommendations: None Plan: Continue with current plan of care    GO     Charlane FerrettiMelissa Johnell Bas, M.A., CCC-SLP [phone]  Nicole Blake 12/06/2014, 4:13 PM

## 2014-12-06 NOTE — Progress Notes (Signed)
PROGRESS NOTE  Nicole Blake ZOX:[id number] DOB: 06/16/1915 DOA: 12/05/2014 PCP:  Duane Lope, MD  Assessment/Plan: CAP possible aspiration -IV Abx- change from levaquin as rash on knee- changed to unasyn- de-escalate to PO in AM -SLP eval- DYS diet  emesis -related to PNA? -none since admission  AKI -baseline Cr 1.2  Lactic acidosis -IVF and recheck -no sign of ischemic bowel- no pain  Diarrhea -r/o c diff  Leukocytosis -resolved  Code Status:full Family Communication: daughter at bedside 3/9 Disposition Plan:    Consultants:  SLP  Procedures:     HPI/Subjective: Eating well Nursing reports wheezing this AM  Objective: Filed Vitals:   12/06/14 0923  BP: 126/54  Pulse: 75  Temp: 98.4 F (36.9 C)  Resp: 20    Intake/Output Summary (Last 24 hours) at 12/06/14 0953 Last data filed at 12/06/14 0840  Gross per 24 hour  Intake 3298.33 ml  Output    578 ml  Net 2720.33 ml   Filed Weights   12/05/14 0057 12/05/14 0610 12/05/14 2010  Weight: 63.05 kg (139 lb) 65.1 kg (143 lb 8.3 oz) 65.74 kg (144 lb 14.9 oz)    Exam:   General:  Pleasant/cooeprative  Cardiovascular: rrr  Respiratory: coarse b/l  Abdomen: +BS, soft  Musculoskeletal: no rash, no edema  Data Reviewed: Basic Metabolic Panel:  Recent Labs Lab 12/05/14 0120 12/05/14 0455 12/06/14 0732  NA 134* 137 137  K 4.0 3.7 4.4  CL 103 107 111  CO2 GLUCOSE 232* 270* 87  BUN 29* 30* 21  CREATININE 1.40* 1.30* 1.18*  CALCIUM 8.8 8.2* 7.7*   Liver Function Tests:  Recent Labs Lab 12/05/14 0120 12/05/14 0455 12/06/14 0732  AST ALT ALKPHOS 67 59 43  BILITOT 0.6 0.4 0.5  PROT 6.2 5.9* 4.9*  ALBUMIN 3.6 3.3* 2.5*    Recent Labs Lab 12/05/14 0120  LIPASE 79*   No results for input(s): AMMONIA in the last 168 hours. CBC:  Recent Labs Lab 12/05/14 0120 12/05/14 0455 12/06/14 0732  WBC 13.2* 11.9* 5.1  NEUTROABS 10.7* 10.8* 4.1  HGB  13.0 12.0 11.2*  HCT 39.9 37.3 34.6*  MCV 89.1 89.2 88.3  PLT 153 139* 112*   Cardiac Enzymes:  Recent Labs Lab 12/05/14 0120  TROPONINI <0.03   BNP (last 3 results) No results for input(s): BNP in the last 8760 hours.  ProBNP (last 3 results) No results for input(s): PROBNP in the last 8760 hours.  CBG:  Recent Labs Lab 12/05/14 1214 12/05/14 1735 12/05/14 2014 12/06/14 0012 12/06/14 0422  GLUCAP 123* 106* 92 89 83    Recent Results (from the past 240 hour(s))  Culture, blood (routine x 2) Call MD if unable to obtain prior to antibiotics being given     Status: None (Preliminary result)   Collection Time: 12/05/14  6:34 AM  Result Value Ref Range Status   Specimen Description BLOOD WRIST LEFT  Final   Special Requests BOTTLES DRAWN AEROBIC ONLY 1CC  Final   Culture PENDING  Incomplete   Report Status PENDING  Incomplete     Studies: Ct Abdomen Pelvis Wo Contrast  12/05/2014   CLINICAL DATA:  Nausea and vomiting for 1 day, initial encounter. Abdominal pain.  EXAM: CT ABDOMEN AND PELVIS WITHOUT CONTRAST  TECHNIQUE: Multidetector CT imaging of the abdomen and pelvis was performed following the standard protocol without IV contrast.  COMPARISON:  None.  FINDINGS: Lower chest:  Lung bases show scattered peribronchovascular ground-glass and consolidation, worst in the right middle lobe. Heart size normal. Coronary artery calcification. No pericardial or pleural effusion.  Hepatobiliary: Liver is unremarkable. Cholecystectomy. No biliary ductal dilatation.  Pancreas: Pancreas appears somewhat atrophic.  Spleen: Negative.  Adrenals/Urinary Tract: Thickening of both adrenal glands. Low-attenuation mass in the interpolar right kidney measures 7.2 cm. A smaller 10 mm low-attenuation lesion is seen in the lower pole right kidney. Lesions are likely cysts although definitive characterization is difficult without post-contrast imaging. Left kidney may be slightly atrophic. Ureters are  decompressed. Bladder is unremarkable.  Stomach/Bowel: Stomach, small bowel, appendix and colon are unremarkable.  Vascular/Lymphatic: Atherosclerotic calcification of the arterial vasculature without abdominal aortic aneurysm. No pathologically enlarged lymph nodes.  Reproductive: Calcifications are seen in the uterus, indicative of fibroids. Ovaries are visualized.  Other: No free fluid. Small periumbilical hernia contains fat. Postoperative changes are seen along the right ventral abdominal wall.  Musculoskeletal: No worrisome lytic or sclerotic lesions. Degenerative changes are seen in the spine.  IMPRESSION: 1. No acute findings in the abdomen or pelvis. 2. Scattered peribronchovascular ground-glass and consolidation in the visualized lung bases, indicative of bronchopneumonia. 3. Small periumbilical hernia contains fat.   Electronically Signed   By: Melinda  Blietz M.D.   On: 12/05/2014 Leanna Battles07:28   Dg Chest 2 View  12/05/2014   CLINICAL DATA:  Shortness of breath.  EXAM: CHEST  2 VIEW  COMPARISON:  12/05/2014  FINDINGS: Bibasilar opacities compatible with atelectasis. Heart is normal size. No effusions. No acute bony abnormality.  IMPRESSION: Bibasilar atelectasis.   Electronically Signed   By: Charlett NoseKevin  Dover M.D.   On: 12/05/2014 14:37   Dg Chest Port 1 View  12/05/2014   CLINICAL DATA:  Vomiting tonight. Cough and shortness of breath and wheezing.  EXAM: PORTABLE CHEST - 1 VIEW  COMPARISON:  None.  FINDINGS: Shallow inspiration. The heart size and mediastinal contours are within normal limits. Both lungs are clear. The visualized skeletal structures are unremarkable.  IMPRESSION: No active disease.   Electronically Signed   By: Burman NievesWilliam  Stevens M.D.   On: 12/05/2014 01:47   Dg Abd Portable 1v  12/05/2014   CLINICAL DATA:  81 year old female with nausea and vomiting for 1 day.  EXAM: PORTABLE ABDOMEN - 1 VIEW  COMPARISON:  None.  FINDINGS: Single upright view focused on the upper abdomen demonstrates no free  intra-abdominal air. No definite dilated bowel loops. There are surgical clips in the right upper quadrant of the abdomen. Right lateral and lower abdomen are not included in the field of view.  IMPRESSION: No free intra-abdominal air.  No evidence of bowel obstruction.   Electronically Signed   By: Rubye OaksMelanie  Ehinger M.D.   On: 12/05/2014 01:45    Scheduled Meds: . ampicillin-sulbactam (UNASYN) IV  1.5 g Intravenous Q12H  . antiseptic oral rinse  7 mL Mouth Rinse BID  . heparin  5,000 Units Subcutaneous 3 times per day  . insulin aspart  0-9 Units Subcutaneous 6 times per day  . latanoprost  1 drop Both Eyes QPM  . pantoprazole  40 mg Oral Daily   Continuous Infusions:   Antibiotics Given (last 72 hours)    Date/Time Action Medication Dose Rate   12/05/14 1458 Given   ampicillin-sulbactam (UNASYN) 1.5 g in sodium chloride 0.9 % 50 mL IVPB 1.5 g 100 mL/hr   12/06/14 0225 Given   ampicillin-sulbactam (UNASYN) 1.5 g in sodium chloride 0.9 % 50 mL IVPB 1.5  g 100 mL/hr      Active Problems:   Nausea and vomiting   Emesis   CAP (community acquired pneumonia)    Time spent: 25 min    Marlin Canary  [facility] Pager [phone]. If 7PM-7AM, please contact night-coverage at [email or web address], password [facility] 12/06/2014, 9:53 AM  LOS: 1 day

## 2014-12-07 ENCOUNTER — Inpatient Hospital Stay (HOSPITAL_COMMUNITY): Payer: Medicare Other

## 2014-12-07 LAB — COMPREHENSIVE METABOLIC PANEL
ALBUMIN: 2.7 g/dL — AB (ref 3.5–5.2)
ALK PHOS: 44 U/L (ref 39–117)
ALT: 12 U/L (ref 0–35)
AST: 19 U/L (ref 0–37)
Anion gap: 7 (ref 5–15)
BUN: 17 mg/dL (ref 6–23)
CALCIUM: 7.8 mg/dL — AB (ref 8.4–10.5)
CO2: 22 mmol/L (ref 19–32)
Chloride: 111 mmol/L (ref 96–112)
Creatinine, Ser: 1.16 mg/dL — ABNORMAL HIGH (ref 0.50–1.10)
GFR calc non Af Amer: 38 mL/min — ABNORMAL LOW (ref >90)
GFR, EST AFRICAN AMERICAN: 44 mL/min — AB (ref >90)
GLUCOSE: 74 mg/dL (ref 70–99)
Potassium: 4.4 mmol/L (ref 3.5–5.1)
Sodium: 140 mmol/L (ref 135–145)
TOTAL PROTEIN: 5 g/dL — AB (ref 6.0–8.3)
Total Bilirubin: 0.5 mg/dL (ref 0.3–1.2)

## 2014-12-07 LAB — CBC WITH DIFFERENTIAL/PLATELET
Basophils Absolute: 0 10*3/uL (ref 0.0–0.1)
Basophils Relative: 0 % (ref 0–1)
EOS ABS: 0.2 10*3/uL (ref 0.0–0.7)
Eosinophils Relative: 3 % (ref 0–5)
HEMATOCRIT: 35.3 % — AB (ref 36.0–46.0)
HEMOGLOBIN: 11.5 g/dL — AB (ref 12.0–15.0)
LYMPHS ABS: 0.8 10*3/uL (ref 0.7–4.0)
Lymphocytes Relative: 19 % (ref 12–46)
MCH: 29.1 pg (ref 26.0–34.0)
MCHC: 32.6 g/dL (ref 30.0–36.0)
MCV: 89.4 fL (ref 78.0–100.0)
Monocytes Absolute: 0.5 10*3/uL (ref 0.1–1.0)
Monocytes Relative: 11 % (ref 3–12)
NEUTROS ABS: 2.9 10*3/uL (ref 1.7–7.7)
NEUTROS PCT: 67 % (ref 43–77)
Platelets: 126 10*3/uL — ABNORMAL LOW (ref 150–400)
RBC: 3.95 MIL/uL (ref 3.87–5.11)
RDW: 13.9 % (ref 11.5–15.5)
WBC: 4.4 10*3/uL (ref 4.0–10.5)

## 2014-12-07 LAB — GLUCOSE, CAPILLARY
GLUCOSE-CAPILLARY: 79 mg/dL (ref 70–99)
Glucose-Capillary: 85 mg/dL (ref 70–99)
Glucose-Capillary: 97 mg/dL (ref 70–99)
Glucose-Capillary: 101 mg/dL — ABNORMAL HIGH (ref 70–99)
Glucose-Capillary: 158 mg/dL — ABNORMAL HIGH (ref 70–99)
Glucose-Capillary: 147 mg/dL — ABNORMAL HIGH (ref 70–99)

## 2014-12-07 MED ORDER — AMOXICILLIN-POT CLAVULANATE 500-125 MG PO TABS
1.0000 | ORAL_TABLET | Freq: Two times a day (BID) | ORAL | Status: DC
  Administered 2014-12-07 – 2014-12-09 (×4): 500 mg via ORAL
  Filled 2014-12-07 (×7): qty 1

## 2014-12-07 MED ORDER — PREDNISONE 20 MG PO TABS
40.0000 mg | ORAL_TABLET | Freq: Every day | ORAL | Status: DC
  Administered 2014-12-07 – 2014-12-09 (×3): 40 mg via ORAL
  Filled 2014-12-07 (×5): qty 2

## 2014-12-07 MED ORDER — FUROSEMIDE 20 MG PO TABS
20.0000 mg | ORAL_TABLET | Freq: Once | ORAL | Status: AC
  Administered 2014-12-07: 20 mg via ORAL
  Filled 2014-12-07: qty 1

## 2014-12-07 MED ORDER — AMOXICILLIN-POT CLAVULANATE 875-125 MG PO TABS
1.0000 | ORAL_TABLET | Freq: Two times a day (BID) | ORAL | Status: DC
  Filled 2014-12-07 (×2): qty 1

## 2014-12-07 NOTE — Progress Notes (Signed)
SATURATION QUALIFICATIONS:   Patient Saturations on Room Air at Rest = 100% Patient Saturations on Room Air while Ambulating = 92% Patient Saturations on 2 Liters of oxygen while Ambulating = 100%

## 2014-12-07 NOTE — Progress Notes (Signed)
PROGRESS NOTE  Nicole Blake ZOX:[id number] DOB: 09/18/1915 DOA: 12/05/2014 PCP:  Duane Lopeoss, Alan, MD  Assessment/Plan: CAP possible aspiration -IV Abx- change from levaquin as rash on knee- changed to unasyn-change to PO -SLP eval- DYS diet -add steroids/breathing treatment -1 dose of lasix for extra fluid  emesis -related to PNA? -none since admission  AKI -baseline Cr 1.2  Lactic acidosis -resolved  Diarrhea -c diff negative  Leukocytosis -resolved  PT Eval  Code Status:full Family Communication: daughter 3/10 Disposition Plan: home in AM   Consultants:  SLP  Procedures:     HPI/Subjective: Wheezing this AM  Objective: Filed Vitals:   12/07/14 0417  BP: 151/69  Pulse: 74  Temp: 98.3 F (36.8 C)  Resp: 19    Intake/Output Summary (Last 24 hours) at 12/07/14 1039 Last data filed at 12/07/14 0658  Gross per 24 hour  Intake    270 ml  Output   1100 ml  Net   -830 ml   Filed Weights   12/05/14 0610 12/05/14 2010 12/06/14 2042  Weight: 65.1 kg (143 lb 8.3 oz) 65.74 kg (144 lb 14.9 oz) 66.2 kg (145 lb 15.1 oz)    Exam:   General:  Pleasant/cooeprative  Cardiovascular: rrr  Respiratory: whezing b/l  Abdomen: +BS, soft  Musculoskeletal: no rash, no edema  Data Reviewed: Basic Metabolic Panel:  Recent Labs Lab 12/05/14 0120 12/05/14 0455 12/06/14 0732 12/07/14 0528  NA 134* 137 137 140  K 4.0 3.7 4.4 4.4  CL 103 107 111 111  CO2 19 21 21 22   GLUCOSE 232* 270* 87 74  BUN 29* 30* 21 17  CREATININE 1.40* 1.30* 1.18* 1.16*  CALCIUM 8.8 8.2* 7.7* 7.8*   Liver Function Tests:  Recent Labs Lab 12/05/14 0120 12/05/14 0455 12/06/14 0732 12/07/14 0528  AST 19 21 18 19   ALT 11 11 11 12   ALKPHOS 67 59 43 44  BILITOT 0.6 0.4 0.5 0.5  PROT 6.2 5.9* 4.9* 5.0*  ALBUMIN 3.6 3.3* 2.5* 2.7*    Recent Labs Lab 12/05/14 0120  LIPASE 79*   No results for input(s): AMMONIA in the last 168 hours. CBC:  Recent Labs Lab  12/05/14 0120 12/05/14 0455 12/06/14 0732 12/07/14 0528  WBC 13.2* 11.9* 5.1 4.4  NEUTROABS 10.7* 10.8* 4.1 2.9  HGB 13.0 12.0 11.2* 11.5*  HCT 39.9 37.3 34.6* 35.3*  MCV 89.1 89.2 88.3 89.4  PLT 153 139* 112* 126*   Cardiac Enzymes:  Recent Labs Lab 12/05/14 0120  TROPONINI <0.03   BNP (last 3 results) No results for input(s): BNP in the last 8760 hours.  ProBNP (last 3 results) No results for input(s): PROBNP in the last 8760 hours.  CBG:  Recent Labs Lab 12/06/14 1704 12/06/14 2001 12/07/14 0016 12/07/14 0418 12/07/14 0819  GLUCAP 108* 92 79 85 97    Recent Results (from the past 240 hour(s))  Culture, blood (routine x 2) Call MD if unable to obtain prior to antibiotics being given     Status: None (Preliminary result)   Collection Time: 12/05/14  6:34 AM  Result Value Ref Range Status   Specimen Description BLOOD WRIST LEFT  Final   Special Requests BOTTLES DRAWN AEROBIC ONLY 1CC  Final   Culture   Final           BLOOD CULTURE RECEIVED NO GROWTH TO DATE CULTURE WILL BE HELD FOR 5 DAYS BEFORE ISSUING A FINAL NEGATIVE REPORT [facility]    Report Status  PENDING  Incomplete  Culture, blood (routine x 2) Call MD if unable to obtain prior to antibiotics being given     Status: None (Preliminary result)   Collection Time: 12/05/14  6:40 AM  Result Value Ref Range Status   Specimen Description BLOOD THUMB LEFT  Final   Special Requests BOTTLES DRAWN AEROBIC ONLY 6CC  Final   Culture   Final           BLOOD CULTURE RECEIVED NO GROWTH TO DATE CULTURE WILL BE HELD FOR 5 DAYS BEFORE ISSUING A FINAL NEGATIVE REPORT Performed at [facility]    Report Status PENDING  Incomplete  Clostridium Difficile by PCR     Status: None   Collection Time: 12/05/14  4:45 PM  Result Value Ref Range Status   C difficile by pcr NEGATIVE NEGATIVE Final     Studies: Dg Chest 1 View  12/07/2014   CLINICAL DATA:  Short of breath.  EXAM: CHEST  1 VIEW   COMPARISON:  12/05/2014  FINDINGS: Bibasilar atelectasis remains, right greater than left. Appearance is stable. There is no evidence of pulmonary edema, consolidation, pneumothorax, nodule or pleural fluid.  IMPRESSION: Stable bibasilar atelectasis, right greater than left.   Electronically Signed   By: Irish Lack M.D.   On: 12/07/2014 10:23   Dg Chest 2 View  12/05/2014   CLINICAL DATA:  Shortness of breath.  EXAM: CHEST  2 VIEW  COMPARISON:  12/05/2014  FINDINGS: Bibasilar opacities compatible with atelectasis. Heart is normal size. No effusions. No acute bony abnormality.  IMPRESSION: Bibasilar atelectasis.   Electronically Signed   By: Charlett Nose M.D.   On: 12/05/2014 14:37    Scheduled Meds: . ampicillin-sulbactam (UNASYN) IV  1.5 g Intravenous Q12H  . antiseptic oral rinse  7 mL Mouth Rinse BID  . furosemide  20 mg Oral Once  . heparin  5,000 Units Subcutaneous 3 times per day  . insulin aspart  0-9 Units Subcutaneous 6 times per day  . latanoprost  1 drop Both Eyes QPM  . pantoprazole  40 mg Oral Daily  . predniSONE  40 mg Oral Q breakfast   Continuous Infusions:   Antibiotics Given (last 72 hours)    Date/Time Action Medication Dose Rate   12/05/14 1458 Given   ampicillin-sulbactam (UNASYN) 1.5 g in sodium chloride 0.9 % 50 mL IVPB 1.5 g 100 mL/hr   12/06/14 0225 Given   ampicillin-sulbactam (UNASYN) 1.5 g in sodium chloride 0.9 % 50 mL IVPB 1.5 g 100 mL/hr   12/06/14 1500 Given   ampicillin-sulbactam (UNASYN) 1.5 g in sodium chloride 0.9 % 50 mL IVPB 1.5 g 100 mL/hr   12/07/14 0500 Given   ampicillin-sulbactam (UNASYN) 1.5 g in sodium chloride 0.9 % 50 mL IVPB 1.5 g 100 mL/hr      Active Problems:   Nausea and vomiting   Emesis   CAP (community acquired pneumonia)    Time spent: 25 min    Cella Cappello  [facility] Pager [phone]. If 7PM-7AM, please contact night-coverage at [email or web address], password [facility] 12/07/2014, 10:39 AM  LOS: 2 days

## 2014-12-07 NOTE — Evaluation (Signed)
Physical Therapy Evaluation Patient Details Name: Nicole Blake MRN: [id number] DOB: 05/12/1915 Today's Date: 12/07/2014   History of Present Illness  Pt admit with SOB.  Developed N/V while in hospital.    Clinical Impression  Pt admitted with above diagnosis. Pt currently with functional limitations due to the deficits listed below (see PT Problem List). Pt currently needing min assist for safety.  If pt doesn't have 24 hour care, recommend NHP for short term rehab.  Pt agreeable.  Tried to call niece but got voicemail.  Thanks and will follow acutely.  Pt will benefit from skilled PT to increase their independence and safety with mobility to allow discharge to the venue listed below.     Follow Up Recommendations SNF;Supervision/Assistance - 24 hour    Equipment Recommendations  Other (comment) (TBA)    Recommendations for Other Services       Precautions / Restrictions Precautions Precautions: Fall Restrictions Weight Bearing Restrictions: No      Mobility  Bed Mobility               General bed mobility comments: In chair on arrival.   Transfers Overall transfer level: Needs assistance Equipment used: Rolling walker (2 wheeled) Transfers: Sit to/from Stand Sit to Stand: Min assist         General transfer comment: Pt needed assist to power up.  Pt able to steady self with RW at times but at times with posterior lean.    Ambulation/Gait Ambulation/Gait assistance: Min assist Ambulation Distance (Feet): 10 Feet Assistive device: Rolling walker (2 wheeled) Gait Pattern/deviations: Decreased stride length;Step-to pattern;Decreased step length - right;Decreased step length - left;Shuffle;Antalgic;Trunk flexed;Wide base of support;Leaning posteriorly   Gait velocity interpretation: Below normal speed for age/gender General Gait Details: Pt does not stand fully upright.  Pt leaning posteriorly at times as well.  Walker steadies pt but with challenges, pt will lose  balance.  Pt ambulated a few steps and then began backing up.  Pt stated, "I can't go too far.".  Pt able to back to chair and sit with cues to control descent.    Stairs            Wheelchair Mobility    Modified Rankin (Stroke Patients Only)       Balance Overall balance assessment: Needs assistance;History of Falls         Standing balance support: Bilateral upper extremity supported;During functional activity Standing balance-Leahy Scale: Poor Standing balance comment: requires UE support for balance.                               Pertinent Vitals/Pain Pain Assessment: No/denies pain  VSS    Home Living Family/patient expects to be discharged to:: Private residence Living Arrangements: Other relatives (pt states Nicole Blake, niece cares for her) Available Help at Discharge: Family;Available PRN/intermittently (Niece and family work;pt alone during day per pt) Type of Home: House Home Access: Level entry     Home Layout: Two level;Bed/bath upstairs Home Equipment: Walker - 2 wheels;Bedside commode;Shower seat Additional Comments: Tried to call Nicole Blake to clarify information but had to leave a message as she did not answer.     Prior Function Level of Independence: Independent with assistive device(s)         Comments: Per pt she could not remember if she used RW all the time.       Hand Dominance  Extremity/Trunk Assessment   Upper Extremity Assessment: Defer to OT evaluation           Lower Extremity Assessment: Generalized weakness      Cervical / Trunk Assessment: Kyphotic  Communication   Communication: No difficulties  Cognition Arousal/Alertness: Awake/alert Behavior During Therapy: Flat affect Overall Cognitive Status: History of cognitive impairments - at baseline       Memory: Decreased short-term memory              General Comments      Exercises        Assessment/Plan    PT Assessment Patient  needs continued PT services  PT Diagnosis Generalized weakness   PT Problem List Decreased activity tolerance;Decreased balance;Decreased mobility;Decreased knowledge of use of DME;Decreased safety awareness;Decreased knowledge of precautions  PT Treatment Interventions DME instruction;Gait training;Stair training;Functional mobility training;Therapeutic activities;Therapeutic exercise;Balance training;Patient/family education   PT Goals (Current goals can be found in the Care Plan section) Acute Rehab PT Goals Patient Stated Goal: to go home PT Goal Formulation: With patient Time For Goal Achievement: 12/14/14 Potential to Achieve Goals: Good    Frequency Min 3X/week   Barriers to discharge        Co-evaluation               End of Session Equipment Utilized During Treatment: Gait belt Activity Tolerance: Patient limited by fatigue Patient left: in chair;with call bell/phone within reach Nurse Communication: Mobility status         Time: 1451-1506 PT Time Calculation (min) (ACUTE ONLY): 15 min   Charges:   PT Evaluation $Initial PT Evaluation Tier I: 1 Procedure     PT G CodesBerline Lopes 16-Dec-2014, 3:23 PM Laelia Angelo [facility] Acute Rehabilitation [phone] (pager)

## 2014-12-07 NOTE — Progress Notes (Signed)
Speech Language Pathology Treatment: Dysphagia  Patient Details Name: Gerlene FeeMartha J Lutter MRN: [id number] DOB: 11/04/1914 Today's Date: 12/07/2014 Time: [id number] SLP Time Calculation (min) (ACUTE ONLY): 10 min  Assessment / Plan / Recommendation Clinical Impression  Pt seen for f/u with advanced diet textures. Per chart review ntoe that pt has new onset wheezing, although she remains afebrile and CXR is stable. No overt signs of aspiration noted with regular textures and thin liquids with Min cues from SLP for smaller sips. Will continue current diet with brief SLP f/u given aforementioned changes in respiratory status.   HPI HPI: Patient is a 81 year old female with significant past medical history of HTN, type 2 DM presenting with emesis and a mild coughing with episode. Family does report patient has had persistent cough over the last few months and intermittently uses an inhaler.   Pertinent Vitals Pain Assessment: No/denies pain  SLP Plan  Continue with current plan of care    Recommendations Diet recommendations: Regular;Thin liquid Liquids provided via: Cup;Straw Medication Administration: Whole meds with liquid Supervision: Patient able to self feed;Intermittent supervision to cue for compensatory strategies Compensations: Slow rate;Small sips/bites Postural Changes and/or Swallow Maneuvers: Seated upright 90 degrees;Upright 30-60 min after meal       Oral Care Recommendations: Oral care BID Follow up Recommendations: None Plan: Continue with current plan of care    Maxcine HamLaura Paiewonsky, M.A. CCC-SLP [phone]  Maxcine Hamaiewonsky, Shervin Cypert 12/07/2014, 4:08 PM

## 2014-12-08 LAB — COMPREHENSIVE METABOLIC PANEL
ALT: 11 U/L (ref 0–35)
ANION GAP: 7 (ref 5–15)
AST: 18 U/L (ref 0–37)
Albumin: 2.8 g/dL — ABNORMAL LOW (ref 3.5–5.2)
Alkaline Phosphatase: 44 U/L (ref 39–117)
BUN: 12 mg/dL (ref 6–23)
CHLORIDE: 107 mmol/L (ref 96–112)
CO2: 24 mmol/L (ref 19–32)
Calcium: 8.4 mg/dL (ref 8.4–10.5)
Creatinine, Ser: 1.16 mg/dL — ABNORMAL HIGH (ref 0.50–1.10)
GFR calc Af Amer: 44 mL/min — ABNORMAL LOW (ref >90)
GFR, EST NON AFRICAN AMERICAN: 38 mL/min — AB (ref >90)
Glucose, Bld: 89 mg/dL (ref 70–99)
Potassium: 4.4 mmol/L (ref 3.5–5.1)
SODIUM: 138 mmol/L (ref 135–145)
Total Bilirubin: 0.4 mg/dL (ref 0.3–1.2)
Total Protein: 5.3 g/dL — ABNORMAL LOW (ref 6.0–8.3)

## 2014-12-08 LAB — CBC WITH DIFFERENTIAL/PLATELET
Basophils Absolute: 0 10*3/uL (ref 0.0–0.1)
Basophils Relative: 0 % (ref 0–1)
EOS ABS: 0 10*3/uL (ref 0.0–0.7)
EOS PCT: 0 % (ref 0–5)
HCT: 36 % (ref 36.0–46.0)
Hemoglobin: 11.9 g/dL — ABNORMAL LOW (ref 12.0–15.0)
Lymphocytes Relative: 17 % (ref 12–46)
Lymphs Abs: 0.8 10*3/uL (ref 0.7–4.0)
MCH: 28.7 pg (ref 26.0–34.0)
MCHC: 33.1 g/dL (ref 30.0–36.0)
MCV: 87 fL (ref 78.0–100.0)
Monocytes Absolute: 0.5 10*3/uL (ref 0.1–1.0)
Monocytes Relative: 11 % (ref 3–12)
NEUTROS ABS: 3.4 10*3/uL (ref 1.7–7.7)
NEUTROS PCT: 72 % (ref 43–77)
Platelets: 146 10*3/uL — ABNORMAL LOW (ref 150–400)
RBC: 4.14 MIL/uL (ref 3.87–5.11)
RDW: 13.6 % (ref 11.5–15.5)
WBC: 4.7 10*3/uL (ref 4.0–10.5)

## 2014-12-08 LAB — GLUCOSE, CAPILLARY
GLUCOSE-CAPILLARY: 108 mg/dL — AB (ref 70–99)
GLUCOSE-CAPILLARY: 127 mg/dL — AB (ref 70–99)
GLUCOSE-CAPILLARY: 163 mg/dL — AB (ref 70–99)
GLUCOSE-CAPILLARY: 192 mg/dL — AB (ref 70–99)
Glucose-Capillary: 81 mg/dL (ref 70–99)
Glucose-Capillary: 80 mg/dL (ref 70–99)

## 2014-12-08 MED ORDER — LISINOPRIL 10 MG PO TABS
10.0000 mg | ORAL_TABLET | Freq: Every day | ORAL | Status: DC
  Administered 2014-12-08 – 2014-12-09 (×2): 10 mg via ORAL
  Filled 2014-12-08 (×2): qty 1

## 2014-12-08 MED ORDER — FUROSEMIDE 20 MG PO TABS
20.0000 mg | ORAL_TABLET | Freq: Once | ORAL | Status: AC
  Administered 2014-12-08: 20 mg via ORAL
  Filled 2014-12-08: qty 1

## 2014-12-08 NOTE — Progress Notes (Signed)
PROGRESS NOTE  Nicole Blake RUE:[id number] DOB: 09-Oct-1914 DOA: 12/05/2014 PCP:  Duane Lope, MD  Assessment/Plan: CAP possible aspiration -IV Abx- change from levaquin as rash on knee- changed to unasyn-change to PO -SLP eval- DYS diet -add steroids/breathing treatment Lasix x 1 again  emesis -related to PNA? -none since admission  AKI -baseline Cr 1.2  Lactic acidosis -resolved  Diarrhea -c diff negative  Leukocytosis -resolved  PT Eval  Code Status:full Family Communication: niece 3/12 Disposition Plan: home in AM with PT/OT   Consultants:  SLP  Procedures:     HPI/Subjective: Breathing still sounding junky  Objective: Filed Vitals:   12/08/14 0737  BP: 159/68  Pulse: 83  Temp: 98.2 F (36.8 C)  Resp: 18    Intake/Output Summary (Last 24 hours) at 12/08/14 1413 Last data filed at 12/08/14 0738  Gross per 24 hour  Intake     60 ml  Output    602 ml  Net   -542 ml   Filed Weights   12/05/14 2010 12/06/14 2042 12/07/14 2019  Weight: 65.74 kg (144 lb 14.9 oz) 66.2 kg (145 lb 15.1 oz) 66.5 kg (146 lb 9.7 oz)    Exam:   General:  Pleasant/cooeprative  Cardiovascular: rrr  Respiratory: course sounds, clears with coughing  Abdomen: +BS, soft  Musculoskeletal: no rash, no edema  Data Reviewed: Basic Metabolic Panel:  Recent Labs Lab 12/05/14 0120 12/05/14 0455 12/06/14 0732 12/07/14 0528 12/08/14 0551  NA 134* 137 137 140 138  K 4.0 3.7 4.4 4.4 4.4  CL 103 107 111 111 107  CO2 GLUCOSE 232* 270* 87 74 89  BUN 29* 30* CREATININE 1.40* 1.30* 1.18* 1.16* 1.16*  CALCIUM 8.8 8.2* 7.7* 7.8* 8.4   Liver Function Tests:  Recent Labs Lab 12/05/14 0120 12/05/14 0455 12/06/14 0732 12/07/14 0528 12/08/14 0551  AST ALT ALKPHOS 67 59 43 44 44  BILITOT 0.6 0.4 0.5 0.5 0.4  PROT 6.2 5.9* 4.9* 5.0* 5.3*  ALBUMIN 3.6 3.3* 2.5* 2.7* 2.8*    Recent Labs Lab  12/05/14 0120  LIPASE 79*   No results for input(s): AMMONIA in the last 168 hours. CBC:  Recent Labs Lab 12/05/14 0120 12/05/14 0455 12/06/14 0732 12/07/14 0528 12/08/14 0551  WBC 13.2* 11.9* 5.1 4.4 4.7  NEUTROABS 10.7* 10.8* 4.1 2.9 3.4  HGB 13.0 12.0 11.2* 11.5* 11.9*  HCT 39.9 37.3 34.6* 35.3* 36.0  MCV 89.1 89.2 88.3 89.4 87.0  PLT 153 139* 112* 126* 146*   Cardiac Enzymes:  Recent Labs Lab 12/05/14 0120  TROPONINI <0.03   BNP (last 3 results) No results for input(s): BNP in the last 8760 hours.  ProBNP (last 3 results) No results for input(s): PROBNP in the last 8760 hours.  CBG:  Recent Labs Lab 12/07/14 2016 12/08/14 0019 12/08/14 0456 12/08/14 0729 12/08/14 1217  GLUCAP 147* 108* 81 80 127*    Recent Results (from the past 240 hour(s))  Culture, blood (routine x 2) Call MD if unable to obtain prior to antibiotics being given     Status: None (Preliminary result)   Collection Time: 12/05/14  6:34 AM  Result Value Ref Range Status   Specimen Description BLOOD WRIST LEFT  Final   Special Requests BOTTLES DRAWN AEROBIC ONLY 1CC  Final   Culture   Final  BLOOD CULTURE RECEIVED NO GROWTH TO DATE CULTURE WILL BE HELD FOR 5 DAYS BEFORE ISSUING A FINAL NEGATIVE REPORT [facility]formed at [facility]    Report Status PENDING  Incomplete  Culture, blood (routine x 2) Call MD if unable to obtain prior to antibiotics being given     Status: None (Preliminary result)   Collection Time: 12/05/14  6:40 AM  Result Value Ref Range Status   Specimen Description BLOOD THUMB LEFT  Final   Special Requests BOTTLES DRAWN AEROBIC ONLY 6CC  Final   Culture   Final           BLOOD CULTURE RECEIVED NO GROWTH TO DATE CULTURE WILL BE HELD FOR 5 DAYS BEFORE ISSUING A FINAL NEGATIVE REPORT Performed at [facility]    Report Status PENDING  Incomplete  Clostridium Difficile by PCR     Status: None   Collection Time: 12/05/14  4:45 PM  Result Value  Ref Range Status   C difficile by pcr NEGATIVE NEGATIVE Final     Studies: Dg Chest 1 View  12/07/2014   CLINICAL DATA:  Short of breath.  EXAM: CHEST  1 VIEW  COMPARISON:  12/05/2014  FINDINGS: Bibasilar atelectasis remains, right greater than left. Appearance is stable. There is no evidence of pulmonary edema, consolidation, pneumothorax, nodule or pleural fluid.  IMPRESSION: Stable bibasilar atelectasis, right greater than left.   Electronically Signed   By: Irish LackGlenn  Yamagata M.D.   On: 12/07/2014 10:23    Scheduled Meds: . amoxicillin-clavulanate  1 tablet Oral Q12H  . antiseptic oral rinse  7 mL Mouth Rinse BID  . heparin  5,000 Units Subcutaneous 3 times per day  . insulin aspart  0-9 Units Subcutaneous 6 times per day  . latanoprost  1 drop Both Eyes QPM  . lisinopril  10 mg Oral Daily  . pantoprazole  40 mg Oral Daily  . predniSONE  40 mg Oral Q breakfast   Continuous Infusions:   Antibiotics Given (last 72 hours)    Date/Time Action Medication Dose Rate   12/05/14 1458 Given   ampicillin-sulbactam (UNASYN) 1.5 g in sodium chloride 0.9 % 50 mL IVPB 1.5 g 100 mL/hr   12/06/14 0225 Given   ampicillin-sulbactam (UNASYN) 1.5 g in sodium chloride 0.9 % 50 mL IVPB 1.5 g 100 mL/hr   12/06/14 1500 Given   ampicillin-sulbactam (UNASYN) 1.5 g in sodium chloride 0.9 % 50 mL IVPB 1.5 g 100 mL/hr   12/07/14 0500 Given   ampicillin-sulbactam (UNASYN) 1.5 g in sodium chloride 0.9 % 50 mL IVPB 1.5 g 100 mL/hr   12/07/14 2031 Given   amoxicillin-clavulanate (AUGMENTIN) 500-125 MG per tablet 500 mg 500 mg    12/08/14 0753 Given   amoxicillin-clavulanate (AUGMENTIN) 500-125 MG per tablet 500 mg 500 mg       Active Problems:   Nausea and vomiting   Emesis   CAP (community acquired pneumonia)    Time spent: 25 min    Marlin CanaryVANN, Tereso Unangst  [facility] Pager [phone]. If 7PM-7AM, please contact night-coverage at [email or web address], password [email or web address] 12/08/2014, 2:13 PM  LOS: 3 days

## 2014-12-09 DIAGNOSIS — J690 Pneumonitis due to inhalation of food and vomit: Principal | ICD-10-CM

## 2014-12-09 DIAGNOSIS — R1111 Vomiting without nausea: Secondary | ICD-10-CM

## 2014-12-09 LAB — GLUCOSE, CAPILLARY
GLUCOSE-CAPILLARY: 137 mg/dL — AB (ref 70–99)
Glucose-Capillary: 102 mg/dL — ABNORMAL HIGH (ref 70–99)
Glucose-Capillary: 106 mg/dL — ABNORMAL HIGH (ref 70–99)
Glucose-Capillary: 153 mg/dL — ABNORMAL HIGH (ref 70–99)

## 2014-12-09 MED ORDER — AMOXICILLIN-POT CLAVULANATE 500-125 MG PO TABS: 1.0000 | ORAL_TABLET | Freq: Two times a day (BID) | ORAL | Status: DC

## 2014-12-09 MED ORDER — PREDNISONE 10 MG PO TABS: ORAL_TABLET | ORAL | Status: DC

## 2014-12-09 NOTE — Care Management Note (Signed)
    Page 1 of 1   12/09/2014     1:23:47 PM CARE MANAGEMENT NOTE 12/09/2014  Patient:  Nicole Blake, Nicole Blake   Account Number:  [id number]  Date Initiated:  12/09/2014  Documentation initiated by:  [facility]  Subjective/Objective Assessment:   adm: CAP possible aspiration     Action/Plan:   discharge planning   Anticipated DC Date:  12/09/2014   Anticipated DC Plan:  [place]  CM consult      [facility] Choice  HOME HEALTH   Choice offered to / List presented to:  C-4 Adult Children        [place] arranged  HH-2 PT  HH-3 OT      [place]   Status of service:  Completed, signed off Medicare Important Message given?  YES (If response is "NO", the following Medicare IM given date fields will be blank) Date Medicare IM given:  12/09/2014 Medicare IM given by:  [facility] Date Additional Medicare IM given:   Additional Medicare IM given by:    Discharge Disposition:  [place]  Per UR Regulation:    If discussed at Long Length of Stay Meetings, dates discussed:    Comments:  12/09/14 13:00 Cm met with pt and family member, Katharine Look [phone] in room to offer choice of home health agency. Bonnita Nasuti and PT by Luvenia Heller.  Address and contact information verified by Katharine Look.  Referral called to [employer], [facility].  No other Cm needs were communicated. Mariane Masters, BSN, [place].

## 2014-12-09 NOTE — Progress Notes (Signed)
Patient Discharge:  Disposition: Pt discharged home with neice  Education:Niece, [name] (caregiver) educated on medications; follow up appointments and all discharge instructions. Verbalized understanding.  IV: Removed   Telemetry: CCMD  Follow-up appointments:Reviewed with pt and niece.  Prescriptions: Scripts given to pt's niece   Transportation: Transported home by niece.  Belongings:All belongings taken with pt

## 2014-12-11 DIAGNOSIS — E119 Type 2 diabetes mellitus without complications: Secondary | ICD-10-CM | POA: Diagnosis not present

## 2014-12-11 DIAGNOSIS — I10 Essential (primary) hypertension: Secondary | ICD-10-CM | POA: Diagnosis not present

## 2014-12-11 DIAGNOSIS — M6281 Muscle weakness (generalized): Secondary | ICD-10-CM | POA: Diagnosis not present

## 2014-12-11 DIAGNOSIS — Z9181 History of falling: Secondary | ICD-10-CM | POA: Diagnosis not present

## 2014-12-11 DIAGNOSIS — J690 Pneumonitis due to inhalation of food and vomit: Secondary | ICD-10-CM | POA: Diagnosis not present

## 2014-12-11 LAB — CULTURE, BLOOD (ROUTINE X 2)
Culture: NO GROWTH
Culture: NO GROWTH

## 2014-12-12 NOTE — Discharge Summary (Signed)
Physician Discharge Summary  Nicole Blake:[id number] DOB: April 19, 1915 DOA: 12/05/2014  PCP:  Duane Lope, MD  Admit date: 12/05/2014 Discharge date: 3/13  Time spent: 35 minutes  Recommendations for Outpatient Follow-up:  1. Home health  Discharge Diagnoses:  Active Problems:   Nausea and vomiting   Emesis   CAP (community acquired pneumonia)   Aspiration pneumonia   Discharge Condition: improved  Diet recommendation: regular  Filed Weights   12/06/14 2042 12/07/14 2019 12/08/14 2103  Weight: 66.2 kg (145 lb 15.1 oz) 66.5 kg (146 lb 9.7 oz) 64.2 kg (141 lb 8.6 oz)    History of present illness:  This is a 81 y.o. year old female with significant past medical history of HTN, type 2 DM presenting with emesis. Per the family, patient was very light milk prior to going Bible study tonight. Family states the patient tolerated food without much incident. One 2 hours later, patient developed subjective nausea and had 2-3 episodes of nonbilious nonbloody emesis. Patient had mild coughing with episode. Family does report patient has had persistent cough over the last one 2 months. Intermittently has seasonal inhaler. Presented to the ER temperature 97.9, heart rate in the 90s to 110s, respirations in the tens to 30s, blood pressure in the 110s to 170s, satting 98% on room air though did have 1 episode of hypoxia with O2 sat 88% on room air. White blood cell count 13.2, hemoglobin 13, creatinine 1.4, sodium 134. Troponin negative 1. Glucose 232. KUB within normals. No evidence of free air. Chest x-ray with no active disease  Hospital Course:  CAP possible aspiration -IV Abx- change from levaquin as rash on knee- changed to unasyn-change to PO and finish course -SLP eval- regular diet -lasix x 2 doses  emesis -related to PNA? -none since admission  AKI -baseline Cr 1.2  Lactic acidosis -resolved  Diarrhea -c diff  negative  Leukocytosis -resolved  Procedures:    Consultations:    Discharge Exam: Filed Vitals:   12/09/14 0815  BP: 144/46  Pulse: 70  Temp: 98.1 F (36.7 C)  Resp: 16     Discharge Instructions   Discharge Instructions    Diet - low sodium heart healthy    Complete by:  As directed      Discharge instructions    Complete by:  As directed   Can use mucinex if patient tolerates Incentive spirometry and flutter valve at home as needed 24 hour supervision Home health     Increase activity slowly    Complete by:  As directed           Discharge Medication List as of 12/09/2014  1:18 PM    START taking these medications   Details  amoxicillin-clavulanate (AUGMENTIN) 500-125 MG per tablet Take 1 tablet (500 mg total) by mouth every 12 (twelve) hours., Starting 12/09/2014, Until Discontinued, Print    predniSONE (DELTASONE) 10 MG tablet 30 mg x 1 days, 20 mg x 1 days, 10 mg x 1 days then d/c, Print      CONTINUE these medications which have NOT CHANGED   Details  budesonide (PULMICORT) 180 MCG/ACT inhaler Inhale 1 puff into the lungs once., Historical Med    DiphenhydrAMINE HCl (BENADRYL ALLERGY PO) Take 1 tablet by mouth daily as needed (allergies)., Until Discontinued, Historical Med    latanoprost (XALATAN) 0.005 % ophthalmic solution Place 1 drop into both eyes every evening., Until Discontinued, Historical Med    lisinopril (PRINIVIL,ZESTRIL) 10 MG tablet Take 10  mg by mouth daily., Until Discontinued, Historical Med       Allergies  Allergen Reactions  . Robitussin (Alcohol Free) [Guaifenesin] Other (See Comments)    Stomach upset  . Sulfa Antibiotics     pts niece believes she may not be able to take this   Follow-up Information    Follow up with  Duane Lopeoss, Alan, MD In 1 week.   Specialty:  Family Medicine   Contact information:   [address] [place] [address] [phone]       Follow up with [facility].   Specialty:   Home Health Services   Why:  home health physical and occupational therapy   Contact information:   [address] [place] [address] [phone]        The results of significant diagnostics from this hospitalization (including imaging, microbiology, ancillary and laboratory) are listed below for reference.    Significant Diagnostic Studies: Ct Abdomen Pelvis Wo Contrast  12/05/2014   CLINICAL DATA:  Nausea and vomiting for 1 day, initial encounter. Abdominal pain.  EXAM: CT ABDOMEN AND PELVIS WITHOUT CONTRAST  TECHNIQUE: Multidetector CT imaging of the abdomen and pelvis was performed following the standard protocol without IV contrast.  COMPARISON:  None.  FINDINGS: Lower chest: Lung bases show scattered peribronchovascular ground-glass and consolidation, worst in the right middle lobe. Heart size normal. Coronary artery calcification. No pericardial or pleural effusion.  Hepatobiliary: Liver is unremarkable. Cholecystectomy. No biliary ductal dilatation.  Pancreas: Pancreas appears somewhat atrophic.  Spleen: Negative.  Adrenals/Urinary Tract: Thickening of both adrenal glands. Low-attenuation mass in the interpolar right kidney measures 7.2 cm. A smaller 10 mm low-attenuation lesion is seen in the lower pole right kidney. Lesions are likely cysts although definitive characterization is difficult without post-contrast imaging. Left kidney may be slightly atrophic. Ureters are decompressed. Bladder is unremarkable.  Stomach/Bowel: Stomach, small bowel, appendix and colon are unremarkable.  Vascular/Lymphatic: Atherosclerotic calcification of the arterial vasculature without abdominal aortic aneurysm. No pathologically enlarged lymph nodes.  Reproductive: Calcifications are seen in the uterus, indicative of fibroids. Ovaries are visualized.  Other: No free fluid. Small periumbilical hernia contains fat. Postoperative changes are seen along the right ventral abdominal wall.  Musculoskeletal: No  worrisome lytic or sclerotic lesions. Degenerative changes are seen in the spine.  IMPRESSION: 1. No acute findings in the abdomen or pelvis. 2. Scattered peribronchovascular ground-glass and consolidation in the visualized lung bases, indicative of bronchopneumonia. 3. Small periumbilical hernia contains fat.   Electronically Signed   By: Leanna BattlesMelinda  Blietz M.D.   On: 12/05/2014 07:28   Dg Chest 1 View  12/07/2014   CLINICAL DATA:  Short of breath.  EXAM: CHEST  1 VIEW  COMPARISON:  12/05/2014  FINDINGS: Bibasilar atelectasis remains, right greater than left. Appearance is stable. There is no evidence of pulmonary edema, consolidation, pneumothorax, nodule or pleural fluid.  IMPRESSION: Stable bibasilar atelectasis, right greater than left.   Electronically Signed   By: Irish LackGlenn  Yamagata M.D.   On: 12/07/2014 10:23   Dg Chest 2 View  12/05/2014   CLINICAL DATA:  Shortness of breath.  EXAM: CHEST  2 VIEW  COMPARISON:  12/05/2014  FINDINGS: Bibasilar opacities compatible with atelectasis. Heart is normal size. No effusions. No acute bony abnormality.  IMPRESSION: Bibasilar atelectasis.   Electronically Signed   By: Charlett NoseKevin  Dover M.D.   On: 12/05/2014 14:37   Dg Chest Port 1 View  12/05/2014   CLINICAL DATA:  Vomiting tonight. Cough  and shortness of breath and wheezing.  EXAM: PORTABLE CHEST - 1 VIEW  COMPARISON:  None.  FINDINGS: Shallow inspiration. The heart size and mediastinal contours are within normal limits. Both lungs are clear. The visualized skeletal structures are unremarkable.  IMPRESSION: No active disease.   Electronically Signed   By: Burman Nieves M.D.   On: 12/05/2014 01:47   Dg Abd Portable 1v  12/05/2014   CLINICAL DATA:  81 year old female with nausea and vomiting for 1 day.  EXAM: PORTABLE ABDOMEN - 1 VIEW  COMPARISON:  None.  FINDINGS: Single upright view focused on the upper abdomen demonstrates no free intra-abdominal air. No definite dilated bowel loops. There are surgical clips in the  right upper quadrant of the abdomen. Right lateral and lower abdomen are not included in the field of view.  IMPRESSION: No free intra-abdominal air.  No evidence of bowel obstruction.   Electronically Signed   By: Rubye Oaks M.D.   On: 12/05/2014 01:45    Microbiology: Recent Results (from the past 240 hour(s))  Culture, blood (routine x 2) Call MD if unable to obtain prior to antibiotics being given     Status: None   Collection Time: 12/05/14  6:34 AM  Result Value Ref Range Status   Specimen Description BLOOD WRIST LEFT  Final   Special Requests BOTTLES DRAWN AEROBIC ONLY 1CC  Final   Culture   Final    NO GROWTH 5 DAYS Performed at [facility]    Report Status 12/11/2014 FINAL  Final  Culture, blood (routine x 2) Call MD if unable to obtain prior to antibiotics being given     Status: None   Collection Time: 12/05/14  6:40 AM  Result Value Ref Range Status   Specimen Description BLOOD THUMB LEFT  Final   Special Requests BOTTLES DRAWN AEROBIC ONLY 6CC  Final   Culture   Final    NO GROWTH 5 DAYS Performed at [facility]    Report Status 12/11/2014 FINAL  Final  Clostridium Difficile by PCR     Status: None   Collection Time: 12/05/14  4:45 PM  Result Value Ref Range Status   C difficile by pcr NEGATIVE NEGATIVE Final     Labs: Basic Metabolic Panel:  Recent Labs Lab 12/06/14 0732 12/07/14 0528 12/08/14 0551  NA 137 140 138  K 4.4 4.4 4.4  CL 111 111 107  CO2 GLUCOSE 87 74 89  BUN CREATININE 1.18* 1.16* 1.16*  CALCIUM 7.7* 7.8* 8.4   Liver Function Tests:  Recent Labs Lab 12/06/14 0732 12/07/14 0528 12/08/14 0551  AST ALT ALKPHOS 43 44 44  BILITOT 0.5 0.5 0.4  PROT 4.9* 5.0* 5.3*  ALBUMIN 2.5* 2.7* 2.8*   No results for input(s): LIPASE, AMYLASE in the last 168 hours. No results for input(s): AMMONIA in the last 168 hours. CBC:  Recent Labs Lab 12/06/14 0732 12/07/14 0528  12/08/14 0551  WBC 5.1 4.4 4.7  NEUTROABS 4.1 2.9 3.4  HGB 11.2* 11.5* 11.9*  HCT 34.6* 35.3* 36.0  MCV 88.3 89.4 87.0  PLT 112* 126* 146*   Cardiac Enzymes: No results for input(s): CKTOTAL, CKMB, CKMBINDEX, TROPONINI in the last 168 hours. BNP: BNP (last 3 results) No results for input(s): BNP in the last 8760 hours.  ProBNP (last 3 results) No results for input(s): PROBNP in the last 8760 hours.  CBG:  Recent Labs Lab 12/08/14 2100 12/09/14 0011 12/09/14 0428 12/09/14 0814 12/09/14 1253  GLUCAP 163* 137* 102* 106* 153*       Signed:  Albaraa Swingle  [facility] 12/12/2014, 4:01 PM

## 2014-12-13 DIAGNOSIS — I10 Essential (primary) hypertension: Secondary | ICD-10-CM | POA: Diagnosis not present

## 2014-12-13 DIAGNOSIS — J690 Pneumonitis due to inhalation of food and vomit: Secondary | ICD-10-CM | POA: Diagnosis not present

## 2014-12-13 DIAGNOSIS — M6281 Muscle weakness (generalized): Secondary | ICD-10-CM | POA: Diagnosis not present

## 2014-12-13 DIAGNOSIS — E119 Type 2 diabetes mellitus without complications: Secondary | ICD-10-CM | POA: Diagnosis not present

## 2014-12-13 DIAGNOSIS — Z9181 History of falling: Secondary | ICD-10-CM | POA: Diagnosis not present

## 2014-12-17 DIAGNOSIS — E119 Type 2 diabetes mellitus without complications: Secondary | ICD-10-CM | POA: Diagnosis not present

## 2014-12-17 DIAGNOSIS — I10 Essential (primary) hypertension: Secondary | ICD-10-CM | POA: Diagnosis not present

## 2014-12-17 DIAGNOSIS — J690 Pneumonitis due to inhalation of food and vomit: Secondary | ICD-10-CM | POA: Diagnosis not present

## 2014-12-17 DIAGNOSIS — Z9181 History of falling: Secondary | ICD-10-CM | POA: Diagnosis not present

## 2014-12-17 DIAGNOSIS — M6281 Muscle weakness (generalized): Secondary | ICD-10-CM | POA: Diagnosis not present

## 2014-12-18 DIAGNOSIS — J189 Pneumonia, unspecified organism: Secondary | ICD-10-CM | POA: Diagnosis not present

## 2014-12-18 DIAGNOSIS — R531 Weakness: Secondary | ICD-10-CM | POA: Diagnosis not present

## 2014-12-18 DIAGNOSIS — R634 Abnormal weight loss: Secondary | ICD-10-CM | POA: Diagnosis not present

## 2014-12-18 DIAGNOSIS — R110 Nausea: Secondary | ICD-10-CM | POA: Diagnosis not present

## 2014-12-19 DIAGNOSIS — I10 Essential (primary) hypertension: Secondary | ICD-10-CM | POA: Diagnosis not present

## 2014-12-19 DIAGNOSIS — E119 Type 2 diabetes mellitus without complications: Secondary | ICD-10-CM | POA: Diagnosis not present

## 2014-12-19 DIAGNOSIS — J690 Pneumonitis due to inhalation of food and vomit: Secondary | ICD-10-CM | POA: Diagnosis not present

## 2014-12-19 DIAGNOSIS — M6281 Muscle weakness (generalized): Secondary | ICD-10-CM | POA: Diagnosis not present

## 2014-12-19 DIAGNOSIS — Z9181 History of falling: Secondary | ICD-10-CM | POA: Diagnosis not present

## 2014-12-20 DIAGNOSIS — J690 Pneumonitis due to inhalation of food and vomit: Secondary | ICD-10-CM | POA: Diagnosis not present

## 2014-12-20 DIAGNOSIS — I10 Essential (primary) hypertension: Secondary | ICD-10-CM | POA: Diagnosis not present

## 2014-12-20 DIAGNOSIS — Z9181 History of falling: Secondary | ICD-10-CM | POA: Diagnosis not present

## 2014-12-20 DIAGNOSIS — E119 Type 2 diabetes mellitus without complications: Secondary | ICD-10-CM | POA: Diagnosis not present

## 2014-12-20 DIAGNOSIS — M6281 Muscle weakness (generalized): Secondary | ICD-10-CM | POA: Diagnosis not present

## 2014-12-25 DIAGNOSIS — E119 Type 2 diabetes mellitus without complications: Secondary | ICD-10-CM | POA: Diagnosis not present

## 2014-12-25 DIAGNOSIS — Z9181 History of falling: Secondary | ICD-10-CM | POA: Diagnosis not present

## 2014-12-25 DIAGNOSIS — I10 Essential (primary) hypertension: Secondary | ICD-10-CM | POA: Diagnosis not present

## 2014-12-25 DIAGNOSIS — J690 Pneumonitis due to inhalation of food and vomit: Secondary | ICD-10-CM | POA: Diagnosis not present

## 2014-12-25 DIAGNOSIS — M6281 Muscle weakness (generalized): Secondary | ICD-10-CM | POA: Diagnosis not present

## 2014-12-27 DIAGNOSIS — E119 Type 2 diabetes mellitus without complications: Secondary | ICD-10-CM | POA: Diagnosis not present

## 2014-12-27 DIAGNOSIS — J690 Pneumonitis due to inhalation of food and vomit: Secondary | ICD-10-CM | POA: Diagnosis not present

## 2014-12-27 DIAGNOSIS — I10 Essential (primary) hypertension: Secondary | ICD-10-CM | POA: Diagnosis not present

## 2014-12-27 DIAGNOSIS — Z9181 History of falling: Secondary | ICD-10-CM | POA: Diagnosis not present

## 2014-12-27 DIAGNOSIS — M6281 Muscle weakness (generalized): Secondary | ICD-10-CM | POA: Diagnosis not present

## 2015-01-04 DIAGNOSIS — E119 Type 2 diabetes mellitus without complications: Secondary | ICD-10-CM | POA: Diagnosis not present

## 2015-01-04 DIAGNOSIS — Z9181 History of falling: Secondary | ICD-10-CM | POA: Diagnosis not present

## 2015-01-04 DIAGNOSIS — M6281 Muscle weakness (generalized): Secondary | ICD-10-CM | POA: Diagnosis not present

## 2015-01-04 DIAGNOSIS — I10 Essential (primary) hypertension: Secondary | ICD-10-CM | POA: Diagnosis not present

## 2015-01-04 DIAGNOSIS — J690 Pneumonitis due to inhalation of food and vomit: Secondary | ICD-10-CM | POA: Diagnosis not present

## 2015-01-10 DIAGNOSIS — I10 Essential (primary) hypertension: Secondary | ICD-10-CM | POA: Diagnosis not present

## 2015-01-10 DIAGNOSIS — Z9181 History of falling: Secondary | ICD-10-CM | POA: Diagnosis not present

## 2015-01-10 DIAGNOSIS — M6281 Muscle weakness (generalized): Secondary | ICD-10-CM | POA: Diagnosis not present

## 2015-01-10 DIAGNOSIS — J690 Pneumonitis due to inhalation of food and vomit: Secondary | ICD-10-CM | POA: Diagnosis not present

## 2015-01-10 DIAGNOSIS — E119 Type 2 diabetes mellitus without complications: Secondary | ICD-10-CM | POA: Diagnosis not present

## 2015-01-15 DIAGNOSIS — Z9181 History of falling: Secondary | ICD-10-CM | POA: Diagnosis not present

## 2015-01-15 DIAGNOSIS — E119 Type 2 diabetes mellitus without complications: Secondary | ICD-10-CM | POA: Diagnosis not present

## 2015-01-15 DIAGNOSIS — M6281 Muscle weakness (generalized): Secondary | ICD-10-CM | POA: Diagnosis not present

## 2015-01-15 DIAGNOSIS — J690 Pneumonitis due to inhalation of food and vomit: Secondary | ICD-10-CM | POA: Diagnosis not present

## 2015-01-15 DIAGNOSIS — I10 Essential (primary) hypertension: Secondary | ICD-10-CM | POA: Diagnosis not present

## 2015-01-17 DIAGNOSIS — E119 Type 2 diabetes mellitus without complications: Secondary | ICD-10-CM | POA: Diagnosis not present

## 2015-01-17 DIAGNOSIS — I10 Essential (primary) hypertension: Secondary | ICD-10-CM | POA: Diagnosis not present

## 2015-01-17 DIAGNOSIS — Z9181 History of falling: Secondary | ICD-10-CM | POA: Diagnosis not present

## 2015-01-17 DIAGNOSIS — M6281 Muscle weakness (generalized): Secondary | ICD-10-CM | POA: Diagnosis not present

## 2015-01-17 DIAGNOSIS — J690 Pneumonitis due to inhalation of food and vomit: Secondary | ICD-10-CM | POA: Diagnosis not present

## 2015-01-31 DIAGNOSIS — I739 Peripheral vascular disease, unspecified: Secondary | ICD-10-CM | POA: Diagnosis not present

## 2015-01-31 DIAGNOSIS — E1151 Type 2 diabetes mellitus with diabetic peripheral angiopathy without gangrene: Secondary | ICD-10-CM | POA: Diagnosis not present

## 2015-01-31 DIAGNOSIS — L603 Nail dystrophy: Secondary | ICD-10-CM | POA: Diagnosis not present

## 2015-03-07 DIAGNOSIS — L03116 Cellulitis of left lower limb: Secondary | ICD-10-CM | POA: Diagnosis not present

## 2015-03-07 DIAGNOSIS — L0390 Cellulitis, unspecified: Secondary | ICD-10-CM | POA: Diagnosis not present

## 2015-03-08 DIAGNOSIS — S80812A Abrasion, left lower leg, initial encounter: Secondary | ICD-10-CM | POA: Diagnosis not present

## 2015-03-08 DIAGNOSIS — Z23 Encounter for immunization: Secondary | ICD-10-CM | POA: Diagnosis not present

## 2015-03-08 DIAGNOSIS — L03116 Cellulitis of left lower limb: Secondary | ICD-10-CM | POA: Diagnosis not present

## 2015-03-13 DIAGNOSIS — H4011X2 Primary open-angle glaucoma, moderate stage: Secondary | ICD-10-CM | POA: Diagnosis not present

## 2015-04-18 DIAGNOSIS — L603 Nail dystrophy: Secondary | ICD-10-CM | POA: Diagnosis not present

## 2015-04-18 DIAGNOSIS — E1151 Type 2 diabetes mellitus with diabetic peripheral angiopathy without gangrene: Secondary | ICD-10-CM | POA: Diagnosis not present

## 2015-04-18 DIAGNOSIS — I739 Peripheral vascular disease, unspecified: Secondary | ICD-10-CM | POA: Diagnosis not present

## 2015-05-09 DIAGNOSIS — E782 Mixed hyperlipidemia: Secondary | ICD-10-CM | POA: Diagnosis not present

## 2015-05-09 DIAGNOSIS — E538 Deficiency of other specified B group vitamins: Secondary | ICD-10-CM | POA: Diagnosis not present

## 2015-05-09 DIAGNOSIS — Z0001 Encounter for general adult medical examination with abnormal findings: Secondary | ICD-10-CM | POA: Diagnosis not present

## 2015-05-09 DIAGNOSIS — I10 Essential (primary) hypertension: Secondary | ICD-10-CM | POA: Diagnosis not present

## 2015-05-09 DIAGNOSIS — E1142 Type 2 diabetes mellitus with diabetic polyneuropathy: Secondary | ICD-10-CM | POA: Diagnosis not present

## 2015-05-31 DIAGNOSIS — Z23 Encounter for immunization: Secondary | ICD-10-CM | POA: Diagnosis not present

## 2015-05-31 DIAGNOSIS — E538 Deficiency of other specified B group vitamins: Secondary | ICD-10-CM | POA: Diagnosis not present

## 2015-07-01 DIAGNOSIS — E538 Deficiency of other specified B group vitamins: Secondary | ICD-10-CM | POA: Diagnosis not present

## 2015-07-05 IMAGING — CR DG ABD PORTABLE 1V
1 series · 1 of 1 positions shown · non-contrast
Comparison: None.

CLINICAL DATA: [AGE] female with nausea and vomiting for 1
day.

EXAM:
PORTABLE ABDOMEN - 1 VIEW

[AP]
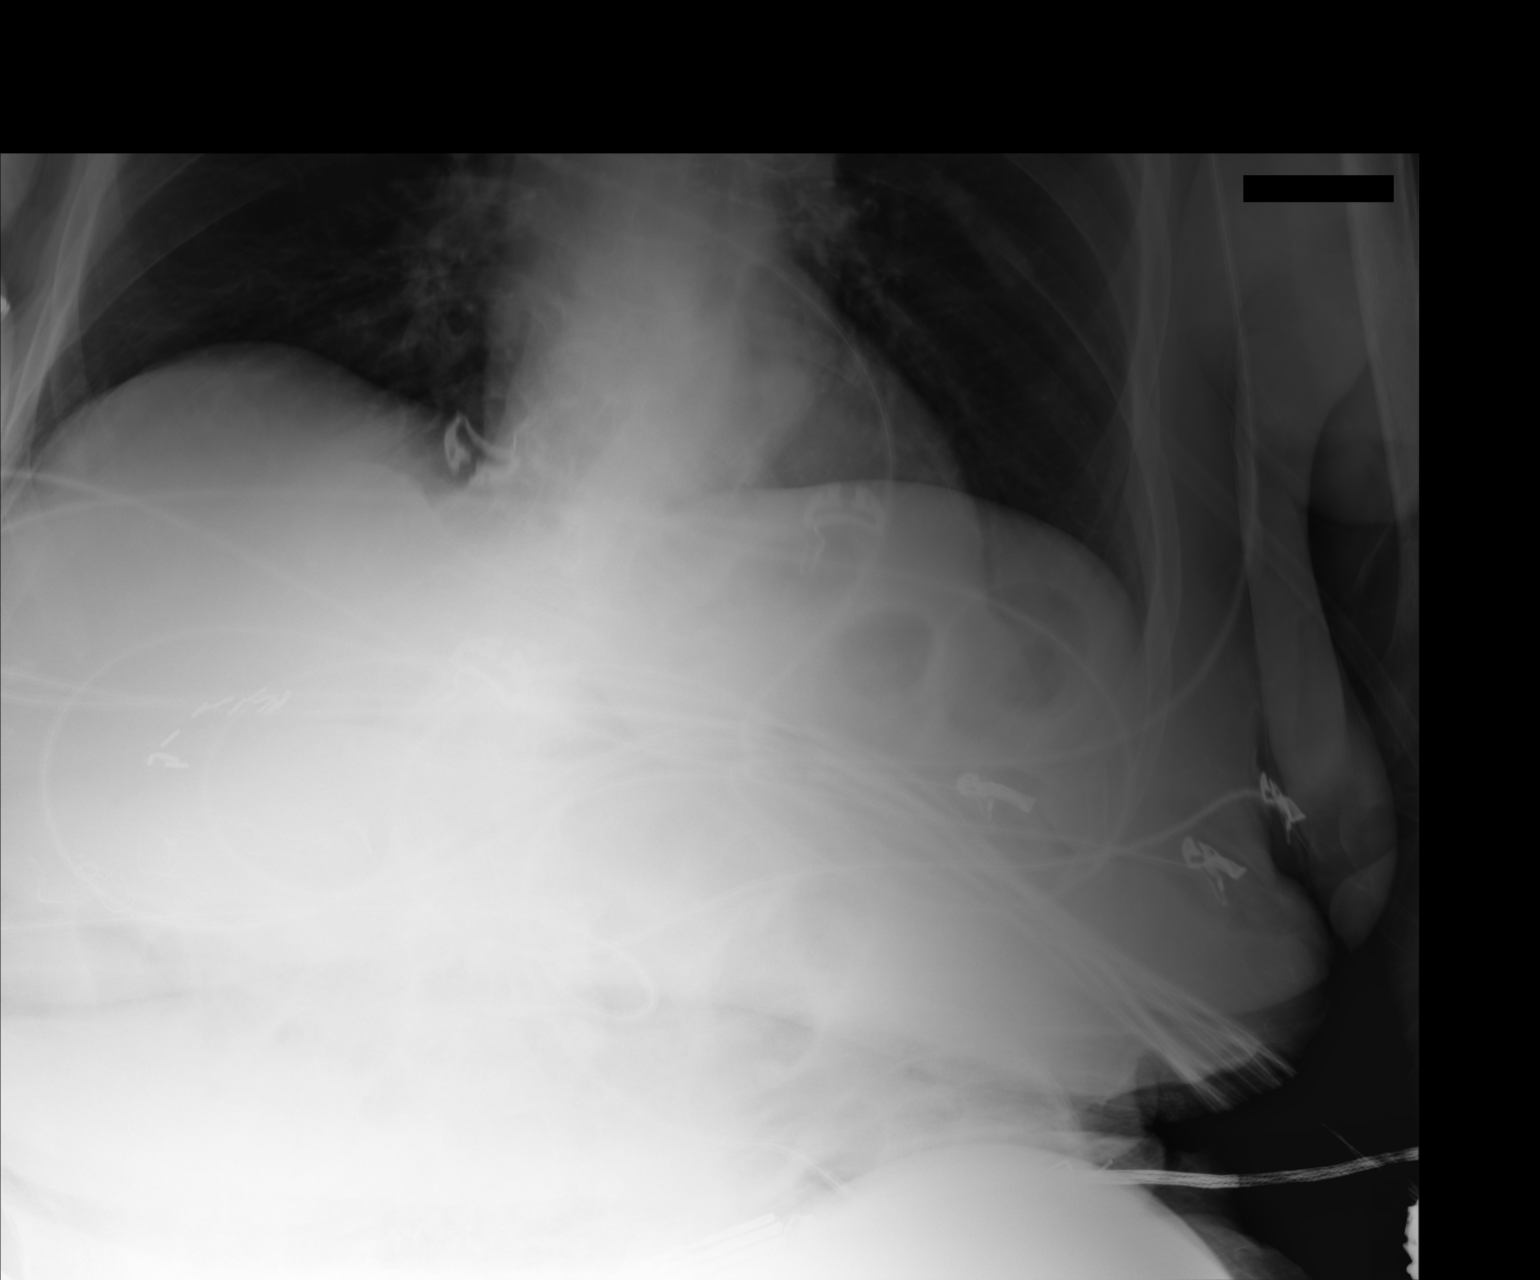

[1 of 1 positions shown; findings below may reference images not displayed]

FINDINGS: Single upright view focused on the upper abdomen demonstrates no
free intra-abdominal air. No definite dilated bowel loops. There are
surgical clips in the right upper quadrant of the abdomen. Right
lateral and lower abdomen are not included in the field of view.
IMPRESSION: No free intra-abdominal air.  No evidence of bowel obstruction.

## 2015-07-08 DIAGNOSIS — L603 Nail dystrophy: Secondary | ICD-10-CM | POA: Diagnosis not present

## 2015-07-08 DIAGNOSIS — L84 Corns and callosities: Secondary | ICD-10-CM | POA: Diagnosis not present

## 2015-07-08 DIAGNOSIS — E1151 Type 2 diabetes mellitus with diabetic peripheral angiopathy without gangrene: Secondary | ICD-10-CM | POA: Diagnosis not present

## 2015-07-08 DIAGNOSIS — I739 Peripheral vascular disease, unspecified: Secondary | ICD-10-CM | POA: Diagnosis not present

## 2015-07-15 DIAGNOSIS — H2513 Age-related nuclear cataract, bilateral: Secondary | ICD-10-CM | POA: Diagnosis not present

## 2015-07-30 DIAGNOSIS — E538 Deficiency of other specified B group vitamins: Secondary | ICD-10-CM | POA: Diagnosis not present

## 2015-10-02 DIAGNOSIS — E538 Deficiency of other specified B group vitamins: Secondary | ICD-10-CM | POA: Diagnosis not present

## 2015-10-10 DIAGNOSIS — E1151 Type 2 diabetes mellitus with diabetic peripheral angiopathy without gangrene: Secondary | ICD-10-CM | POA: Diagnosis not present

## 2015-10-10 DIAGNOSIS — L603 Nail dystrophy: Secondary | ICD-10-CM | POA: Diagnosis not present

## 2015-10-10 DIAGNOSIS — I739 Peripheral vascular disease, unspecified: Secondary | ICD-10-CM | POA: Diagnosis not present

## 2015-10-15 DIAGNOSIS — L723 Sebaceous cyst: Secondary | ICD-10-CM | POA: Diagnosis not present

## 2015-10-15 DIAGNOSIS — L0291 Cutaneous abscess, unspecified: Secondary | ICD-10-CM | POA: Diagnosis not present

## 2015-11-14 DIAGNOSIS — J309 Allergic rhinitis, unspecified: Secondary | ICD-10-CM | POA: Diagnosis not present

## 2015-11-14 DIAGNOSIS — R634 Abnormal weight loss: Secondary | ICD-10-CM | POA: Diagnosis not present

## 2015-11-14 DIAGNOSIS — E1142 Type 2 diabetes mellitus with diabetic polyneuropathy: Secondary | ICD-10-CM | POA: Diagnosis not present

## 2015-11-14 DIAGNOSIS — E538 Deficiency of other specified B group vitamins: Secondary | ICD-10-CM | POA: Diagnosis not present

## 2015-11-27 DIAGNOSIS — E1142 Type 2 diabetes mellitus with diabetic polyneuropathy: Secondary | ICD-10-CM | POA: Diagnosis not present

## 2015-11-27 DIAGNOSIS — E538 Deficiency of other specified B group vitamins: Secondary | ICD-10-CM | POA: Diagnosis not present

## 2015-12-15 DIAGNOSIS — I10 Essential (primary) hypertension: Secondary | ICD-10-CM | POA: Diagnosis not present

## 2015-12-15 DIAGNOSIS — J988 Other specified respiratory disorders: Secondary | ICD-10-CM | POA: Diagnosis not present

## 2015-12-15 DIAGNOSIS — J069 Acute upper respiratory infection, unspecified: Secondary | ICD-10-CM | POA: Diagnosis not present

## 2015-12-15 DIAGNOSIS — J45909 Unspecified asthma, uncomplicated: Secondary | ICD-10-CM | POA: Diagnosis not present

## 2015-12-17 DIAGNOSIS — J988 Other specified respiratory disorders: Secondary | ICD-10-CM | POA: Diagnosis not present

## 2015-12-19 ENCOUNTER — Encounter (HOSPITAL_COMMUNITY): Payer: Self-pay

## 2015-12-19 ENCOUNTER — Emergency Department (HOSPITAL_COMMUNITY): Payer: Medicare Other

## 2015-12-19 DIAGNOSIS — R64 Cachexia: Secondary | ICD-10-CM | POA: Diagnosis not present

## 2015-12-19 DIAGNOSIS — J40 Bronchitis, not specified as acute or chronic: Secondary | ICD-10-CM | POA: Diagnosis not present

## 2015-12-19 DIAGNOSIS — Z7951 Long term (current) use of inhaled steroids: Secondary | ICD-10-CM | POA: Diagnosis not present

## 2015-12-19 DIAGNOSIS — R531 Weakness: Secondary | ICD-10-CM | POA: Insufficient documentation

## 2015-12-19 DIAGNOSIS — Z79899 Other long term (current) drug therapy: Secondary | ICD-10-CM | POA: Diagnosis not present

## 2015-12-19 DIAGNOSIS — R7989 Other specified abnormal findings of blood chemistry: Secondary | ICD-10-CM | POA: Insufficient documentation

## 2015-12-19 DIAGNOSIS — J069 Acute upper respiratory infection, unspecified: Secondary | ICD-10-CM | POA: Diagnosis not present

## 2015-12-19 DIAGNOSIS — E119 Type 2 diabetes mellitus without complications: Secondary | ICD-10-CM | POA: Diagnosis not present

## 2015-12-19 DIAGNOSIS — I10 Essential (primary) hypertension: Secondary | ICD-10-CM | POA: Insufficient documentation

## 2015-12-19 DIAGNOSIS — R748 Abnormal levels of other serum enzymes: Secondary | ICD-10-CM | POA: Diagnosis not present

## 2015-12-19 DIAGNOSIS — Z792 Long term (current) use of antibiotics: Secondary | ICD-10-CM | POA: Diagnosis not present

## 2015-12-19 DIAGNOSIS — R05 Cough: Secondary | ICD-10-CM | POA: Diagnosis not present

## 2015-12-19 DIAGNOSIS — R062 Wheezing: Secondary | ICD-10-CM | POA: Diagnosis present

## 2015-12-19 LAB — CBC WITH DIFFERENTIAL/PLATELET
BASOS ABS: 0 10*3/uL (ref 0.0–0.1)
BASOS PCT: 0 %
EOS ABS: 0.1 10*3/uL (ref 0.0–0.7)
EOS PCT: 1 %
HCT: 39.2 % (ref 36.0–46.0)
Hemoglobin: 12.7 g/dL (ref 12.0–15.0)
Lymphocytes Relative: 16 %
Lymphs Abs: 1 10*3/uL (ref 0.7–4.0)
MCH: 28.9 pg (ref 26.0–34.0)
MCHC: 32.4 g/dL (ref 30.0–36.0)
MCV: 89.1 fL (ref 78.0–100.0)
MONO ABS: 0.9 10*3/uL (ref 0.1–1.0)
Monocytes Relative: 14 %
Neutro Abs: 4.3 10*3/uL (ref 1.7–7.7)
Neutrophils Relative %: 69 %
PLATELETS: 186 10*3/uL (ref 150–400)
RBC: 4.4 MIL/uL (ref 3.87–5.11)
RDW: 13.7 % (ref 11.5–15.5)
WBC: 6.2 10*3/uL (ref 4.0–10.5)

## 2015-12-19 LAB — COMPREHENSIVE METABOLIC PANEL
ALT: 15 U/L (ref 14–54)
AST: 28 U/L (ref 15–41)
Albumin: 3.3 g/dL — ABNORMAL LOW (ref 3.5–5.0)
Alkaline Phosphatase: 50 U/L (ref 38–126)
Anion gap: 8 (ref 5–15)
BUN: 32 mg/dL — ABNORMAL HIGH (ref 6–20)
CHLORIDE: 99 mmol/L — AB (ref 101–111)
CO2: 24 mmol/L (ref 22–32)
CREATININE: 1.53 mg/dL — AB (ref 0.44–1.00)
Calcium: 8.8 mg/dL — ABNORMAL LOW (ref 8.9–10.3)
GFR calc Af Amer: 31 mL/min — ABNORMAL LOW (ref >60)
GFR, EST NON AFRICAN AMERICAN: 27 mL/min — AB (ref >60)
Glucose, Bld: 198 mg/dL — ABNORMAL HIGH (ref 65–99)
POTASSIUM: 4.4 mmol/L (ref 3.5–5.1)
Sodium: 131 mmol/L — ABNORMAL LOW (ref 135–145)
Total Bilirubin: 0.4 mg/dL (ref 0.3–1.2)
Total Protein: 5.9 g/dL — ABNORMAL LOW (ref 6.5–8.1)

## 2015-12-19 MED ORDER — ALBUTEROL SULFATE (2.5 MG/3ML) 0.083% IN NEBU
INHALATION_SOLUTION | RESPIRATORY_TRACT | Status: DC
Start: 2015-12-19 — End: 2015-12-20
  Filled 2015-12-19: qty 6

## 2015-12-19 MED ORDER — ALBUTEROL SULFATE (2.5 MG/3ML) 0.083% IN NEBU
5.0000 mg | INHALATION_SOLUTION | Freq: Once | RESPIRATORY_TRACT | Status: AC
  Administered 2015-12-19: 5 mg via RESPIRATORY_TRACT

## 2015-12-19 NOTE — ED Notes (Signed)
Pt brought in by her daughter for cough and wheezing. She went to [facility] on march 20 or same, had chest xray done showing no pneumonia. Daughter reports the abx, prednisone and inhaler have not worked. She also reports reports runny nose. Denies pain/SOB.

## 2015-12-20 ENCOUNTER — Emergency Department (HOSPITAL_COMMUNITY)
Admission: EM | Admit: 2015-12-20 | Discharge: 2015-12-20 | Disposition: A | Discharge status: Home / Self Care | Payer: Medicare Other | Attending: Emergency Medicine | Admitting: Emergency Medicine

## 2015-12-20 DIAGNOSIS — J40 Bronchitis, not specified as acute or chronic: Secondary | ICD-10-CM

## 2015-12-20 DIAGNOSIS — R7989 Other specified abnormal findings of blood chemistry: Secondary | ICD-10-CM

## 2015-12-20 DIAGNOSIS — J069 Acute upper respiratory infection, unspecified: Secondary | ICD-10-CM

## 2015-12-20 MED ORDER — SODIUM CHLORIDE 0.9 % IV BOLUS (SEPSIS)
500.0000 mL | Freq: Once | INTRAVENOUS | Status: AC
  Administered 2015-12-20: 500 mL via INTRAVENOUS

## 2015-12-20 MED ORDER — ALBUTEROL SULFATE HFA 108 (90 BASE) MCG/ACT IN AERS
2.0000 | INHALATION_SPRAY | Freq: Once | RESPIRATORY_TRACT | Status: AC
  Administered 2015-12-20: 2 via RESPIRATORY_TRACT
  Filled 2015-12-20: qty 6.7

## 2015-12-20 NOTE — ED Provider Notes (Signed)
CSN: [id number]     Arrival date & time 12/19/15  2145 History  By signing my name below, I, Ronney Lion, attest that this documentation has been prepared under the direction and in the presence of Alvira Monday, MD. Electronically Signed: Ronney Lion, ED Scribe. 12/20/2015. 2:23 AM.   Chief Complaint  Patient presents with  . Wheezing   Patient is a 82 y.o. female presenting with cough. The history is provided by a relative (niece). No language interpreter was used.  Cough Cough characteristics:  Non-productive Severity:  Moderate Onset quality:  Gradual Duration:  5 days Timing:  Constant Progression:  Worsening Chronicity:  New Associated symptoms: rhinorrhea and wheezing (no orthopnea)   Associated symptoms: no chest pain, no fever, no headaches, no rash, no shortness of breath and no sore throat    HPI Comments: Nicole Blake is a 82 y.o. female with a history of hypertension and DM, who presents to the Emergency Department complaining of unchanged, constant, nonproductive cough that began 5 days ago. Associated symptoms include wheezing, generalized weakness, loss of appetite, decreased PO intake, and rhinorrhea. Her niece states she brought her in to [facility] for the same on 12/16/15, about 4 days ago. She had an XR done and was given prednisone (completed course), Tessalon, doxycycline, Tussin-D, and Symbicort. Patient's niece had given her albuterol one night, with minimal relief to her symptoms. She denies sore throat, headache, abdominal pain, vomiting, or fever. Patient was given an albuterol nebulizer treatment on arrival to the ED, which significantly improved her wheezing.  Past Medical History  Diagnosis Date  . Hypertension   . Diabetes mellitus without complication (HCC)    History reviewed. No pertinent past surgical history. No family history on file. Social History  Substance Use Topics  . Smoking status: Never Smoker   . Smokeless tobacco: None  .  Alcohol Use: No   OB History    No data available     Review of Systems  Constitutional: Positive for appetite change. Negative for fever.  HENT: Positive for rhinorrhea. Negative for sore throat.   Eyes: Negative for visual disturbance.  Respiratory: Positive for cough and wheezing (no orthopnea). Negative for shortness of breath.   Cardiovascular: Negative for chest pain.  Gastrointestinal: Negative for vomiting and abdominal pain.  Genitourinary: Negative for difficulty urinating.  Musculoskeletal: Negative for back pain and neck pain.  Skin: Negative for rash.  Neurological: Positive for weakness (generalized). Negative for syncope and headaches.  All other systems reviewed and are negative.     Allergies  Robitussin (alcohol free) and Sulfa antibiotics  Home Medications   Prior to Admission medications   Medication Sig Start Date End Date Taking? Authorizing Provider  benzonatate (TESSALON) 100 MG capsule Take 100 mg by mouth 3 (three) times daily as needed for cough.   Yes Historical Provider, MD  budesonide-formoterol (SYMBICORT) 80-4.5 MCG/ACT inhaler Inhale 2 puffs into the lungs 2 (two) times daily.   Yes Historical Provider, MD  Cholecalciferol (VITAMIN D PO) Take 1 tablet by mouth daily.   Yes Historical Provider, MD  doxycycline (VIBRAMYCIN) 100 MG capsule Take 100 mg by mouth 2 (two) times daily.   Yes Historical Provider, MD  lisinopril (PRINIVIL,ZESTRIL) 2.5 MG tablet Take 2.5 mg by mouth daily.   Yes Historical Provider, MD  amoxicillin-clavulanate (AUGMENTIN) 500-125 MG per tablet Take 1 tablet (500 mg total) by mouth every 12 (twelve) hours. Patient not taking: Reported on 12/20/2015 12/09/14   Selinda Orion  Vann, DO  predniSONE (DELTASONE) 10 MG tablet 30 mg x 1 days, 20 mg x 1 days, 10 mg x 1 days then d/c Patient not taking: Reported on 12/20/2015 12/09/14   Shanda BumpsJessica U Vann, DO   BP 108/62 mmHg  Pulse 82  Temp(Src) 98.7 F (37.1 C) (Oral)  Resp 16  SpO2  96% Physical Exam  Constitutional: She is oriented to person, place, and time. She appears well-developed. She appears cachectic. No distress.  HENT:  Head: Normocephalic and atraumatic.  Eyes: Conjunctivae and EOM are normal.  Neck: Normal range of motion.  Cardiovascular: Normal rate, regular rhythm, normal heart sounds and intact distal pulses.  Exam reveals no gallop and no friction rub.   No murmur heard. Pulmonary/Chest: Effort normal and breath sounds normal. No respiratory distress. She has no wheezes (on reevaluatoin mild end expiratory wheeze with forced exhalation). She has no rales.  Abdominal: Soft. She exhibits no distension. There is no tenderness. There is no guarding.  Musculoskeletal: She exhibits no edema or tenderness.  Neurological: She is alert and oriented to person, place, and time.  Skin: Skin is warm and dry. No rash noted. She is not diaphoretic. No erythema.  Nursing note and vitals reviewed.   ED Course  Procedures (including critical care time)  DIAGNOSTIC STUDIES: Oxygen Saturation is 99% on RA, normal by my interpretation.    COORDINATION OF CARE: 1:59 AM - CXR and lab results discussed with pt's niece. Suspect URI. Discussed treatment plan with pt's niece at bedside which includes IV fluids to treat mild dehydration, and PCP follow-up in 1 week. Pt's niece verbalized understanding and agreed to plan.   Labs Review Labs Reviewed  COMPREHENSIVE METABOLIC PANEL - Abnormal; Notable for the following:    Sodium 131 (*)    Chloride 99 (*)    Glucose, Bld 198 (*)    BUN 32 (*)    Creatinine, Ser 1.53 (*)    Calcium 8.8 (*)    Total Protein 5.9 (*)    Albumin 3.3 (*)    GFR calc non Af Amer 27 (*)    GFR calc Af Amer 31 (*)    All other components within normal limits  CBC WITH DIFFERENTIAL/PLATELET    Imaging Review Dg Chest 2 View  12/19/2015  CLINICAL DATA:  82 year old female with cough and wheezing EXAM: CHEST  2 VIEW COMPARISON:  Radiograph  dated 12/07/2014 FINDINGS: Two views of the chest do not demonstrate a focal consolidation. There is no pleural effusion or pneumothorax. The cardiac silhouette is within normal limits there is osteopenia with degenerative changes of the spine. No acute fracture. IMPRESSION: No active cardiopulmonary disease. Electronically Signed   By: Elgie CollardArash  Radparvar M.D.   On: 12/19/2015 23:15   I have personally reviewed and evaluated these images and lab results as part of my medical decision-making.  MDM   Final diagnoses:  Upper respiratory infection  Bronchitis  Elevated serum creatinine   82 year old female with a history of hypertension and diabetes presents with concern for cough and wheezing since Sunday. Patient was seen at the [facility] walk-in clinic, and was initiated on prednisone, Tessalon pearls and doxycycline. Her symptoms have continued since that time however not worsened. X-ray was performed in the emergency department would show no evidence of pneumonia. Patient's vital signs are within normal limits, she's not tachypnea, not hypoxic on room air. On initial exam, patient did not have any wheezing, and reevaluation and have very mild end expiratory wheeze with  forced expiration. Patient symptoms are likely secondary to continued viral illness. She had tested negative for flu at urgent care. Given patient's response to albuterol, recommended she continue using albuterol inhaler every four hours as needed. She was also given a prescription for Symbicort previously by PCP, and recommended using this daily. Patient also with mild elevation in Cr from baseline 1.16 to 1.53, likely secondary to dehydration. Discused need for hydration, (after failed IV attempt, pt and daugther prefer oral hydration which I feel is reasonable given mild Cr increase) and follow up in 1 week for recheck of kidney function.  Patient is elderly, otherwise while appearing, and appropriate for outpatient care. Recommend close PCP  follow-up.  Patient discharged in stable condition with understanding of reasons to return.    I personally performed the services described in this documentation, which was scribed in my presence. The recorded information has been reviewed and is accurate.     Alvira Monday, MD 12/20/15 2024

## 2015-12-20 NOTE — ED Notes (Signed)
Pt left with all his belongings and was wheeled out of the treatment area.  

## 2015-12-20 NOTE — Discharge Instructions (Signed)
Continue antibiotics, symbicort.  Take albuterol inhaler every 4 hours as needed for wheezing. Stay hydrated.  How to Use an Inhaler Using your inhaler correctly is very important. Good technique will make sure that the medicine reaches your lungs.  HOW TO USE AN INHALER: 1. Take the cap off the inhaler. 2. If this is the first time using your inhaler, you need to prime it. Shake the inhaler for 5 seconds. Release four puffs into the air, away from your face. Ask your doctor for help if you have questions. 3. Shake the inhaler for 5 seconds. 4. Turn the inhaler so the bottle is above the mouthpiece. 5. Put your pointer finger on top of the bottle. Your thumb holds the bottom of the inhaler. 6. Open your mouth. 7. Either hold the inhaler away from your mouth (the width of 2 fingers) or place your lips tightly around the mouthpiece. Ask your doctor which way to use your inhaler. 8. Breathe out as much air as possible. 9. Breathe in and push down on the bottle 1 time to release the medicine. You will feel the medicine go in your mouth and throat. 10. Continue to take a deep breath in very slowly. Try to fill your lungs. 11. After you have breathed in completely, hold your breath for 10 seconds. This will help the medicine to settle in your lungs. If you cannot hold your breath for 10 seconds, hold it for as long as you can before you breathe out. 12. Breathe out slowly, through pursed lips. Whistling is an example of pursed lips. 13. If your doctor has told you to take more than 1 puff, wait at least 15-30 seconds between puffs. This will help you get the best results from your medicine. Do not use the inhaler more than your doctor tells you to. 14. Put the cap back on the inhaler. 15. Follow the directions from your doctor or from the inhaler package about cleaning the inhaler. If you use more than one inhaler, ask your doctor which inhalers to use and what order to use them in. Ask your doctor to  help you figure out when you will need to refill your inhaler.  If you use a steroid inhaler, always rinse your mouth with water after your last puff, gargle and spit out the water. Do not swallow the water. GET HELP IF:  The inhaler medicine only partially helps to stop wheezing or shortness of breath.  You are having trouble using your inhaler.  You have some increase in thick spit (phlegm). GET HELP RIGHT AWAY IF:  The inhaler medicine does not help your wheezing or shortness of breath or you have tightness in your chest.  You have dizziness, headaches, or fast heart rate.  You have chills, fever, or night sweats.  You have a large increase of thick spit, or your thick spit is bloody. MAKE SURE YOU:   Understand these instructions.  Will watch your condition.  Will get help right away if you are not doing well or get worse.   This information is not intended to replace advice given to you by your health care provider. Make sure you discuss any questions you have with your health care provider.   Document Released: 06/23/2008 Document Revised: 07/05/2013 Document Reviewed: 04/13/2013 Elsevier Interactive Patient Education [employer]2016 Elsevier Inc.

## 2015-12-24 DIAGNOSIS — H40013 Open angle with borderline findings, low risk, bilateral: Secondary | ICD-10-CM | POA: Diagnosis not present

## 2015-12-25 DIAGNOSIS — I739 Peripheral vascular disease, unspecified: Secondary | ICD-10-CM | POA: Diagnosis not present

## 2015-12-25 DIAGNOSIS — L603 Nail dystrophy: Secondary | ICD-10-CM | POA: Diagnosis not present

## 2015-12-25 DIAGNOSIS — E1151 Type 2 diabetes mellitus with diabetic peripheral angiopathy without gangrene: Secondary | ICD-10-CM | POA: Diagnosis not present

## 2015-12-27 DIAGNOSIS — E46 Unspecified protein-calorie malnutrition: Secondary | ICD-10-CM | POA: Diagnosis not present

## 2015-12-27 DIAGNOSIS — R634 Abnormal weight loss: Secondary | ICD-10-CM | POA: Diagnosis not present

## 2016-01-07 DIAGNOSIS — R531 Weakness: Secondary | ICD-10-CM | POA: Diagnosis not present

## 2016-02-12 ENCOUNTER — Ambulatory Visit (INDEPENDENT_AMBULATORY_CARE_PROVIDER_SITE_OTHER): Payer: Medicare Other | Admitting: Podiatry

## 2016-02-12 ENCOUNTER — Encounter: Payer: Self-pay | Admitting: Podiatry

## 2016-02-12 VITALS — BP 103/60 | HR 62 | Resp 12

## 2016-02-12 DIAGNOSIS — M2011 Hallux valgus (acquired), right foot: Secondary | ICD-10-CM

## 2016-02-12 DIAGNOSIS — R0989 Other specified symptoms and signs involving the circulatory and respiratory systems: Secondary | ICD-10-CM | POA: Diagnosis not present

## 2016-02-12 DIAGNOSIS — M2012 Hallux valgus (acquired), left foot: Secondary | ICD-10-CM

## 2016-02-12 DIAGNOSIS — R609 Edema, unspecified: Secondary | ICD-10-CM

## 2016-02-12 DIAGNOSIS — I7389 Other specified peripheral vascular diseases: Secondary | ICD-10-CM | POA: Diagnosis not present

## 2016-02-12 NOTE — Progress Notes (Signed)
   Subjective:    Patient ID: Nicole Blake, female    DOB: 04/26/1915, 82 y.o.   MRN: [id number]  HPI    This patient presents today with her niece who is present in the treatment room who is speaking for the patient. The patient's niece states that her aunt has had ongoing diabetic shoes and is requesting replacement diabetic shoes. Her former podiatrist does not dispensed diabetic shoes and this is the reason that she presents today. Also patient's niece states that her aunt has periodic debridement of her toenails, however this was done recently at the other podiatrist office and they do not request this service. Patient's niece states that her diabetic doctor is Dr. Gildardo Crankerharles Ross and they have an appointment scheduled within the next several days Patient's niece denies any history of foot ulceration, claudication or amputation   Review of Systems  HENT: Positive for sinus pressure.   Hematological: Bruises/bleeds easily.       Objective:   Physical Exam  Patient appears confused and has difficulty responding to questioning  Vascular: Bilateral peripheral pitting edema DP pulses 1/4 bilaterally PT pulses 1/4 bilaterally Capillary reflex delayed bilaterally  Neurological: Sensation to 10 g monofilament wire patient has difficulty responding Sensation to tuning for patient has difficulty responding Ankle reflexes equal and reactive bilaterally  Dermatological: No open skin lesions bilaterally Atrophic skin bilaterally The toenails appear trimmed with deformity and texture and color changes  Musculoskeletal: HAV deformities bilaterally Hammertoe deformity 2-5 bilaterally Patient has soft diabetic shoes with Velcro closures with custom insoles       Assessment & Plan:   Assessment: Patient with a history of diabetes under management of Dr. Gildardo Crankerharles Ross Decrease pedal pulses bilaterally Unable to evaluate sensory perception bilaterally HAV bilaterally Hammertoes  2-5 bilaterally  Plan: All submitted certification to Dr. Gildardo Crankerharles Ross for the following indications Decreased DP and PT pulses Foot deformity, HAV bilaterally, hammertoe 2-5 bilaterally  Notify patient's niece upon certification for diabetic shoes

## 2016-02-12 NOTE — Patient Instructions (Signed)
We will obtain certification for diabetic shoes and contact you upon receipt of certification for measuring of diabetic shoes and custom insoles Diabetes and Foot Care Diabetes may cause you to have problems because of poor blood supply (circulation) to your feet and legs. This may cause the skin on your feet to become thinner, break easier, and heal more slowly. Your skin may become dry, and the skin may peel and crack. You may also have nerve damage in your legs and feet causing decreased feeling in them. You may not notice minor injuries to your feet that could lead to infections or more serious problems. Taking care of your feet is one of the most important things you can do for yourself.  HOME CARE INSTRUCTIONS  Wear shoes at all times, even in the house. Do not go barefoot. Bare feet are easily injured.  Check your feet daily for blisters, cuts, and redness. If you cannot see the bottom of your feet, use a mirror or ask someone for help.  Wash your feet with warm water (do not use hot water) and mild soap. Then pat your feet and the areas between your toes until they are completely dry. Do not soak your feet as this can dry your skin.  Apply a moisturizing lotion or petroleum jelly (that does not contain alcohol and is unscented) to the skin on your feet and to dry, brittle toenails. Do not apply lotion between your toes.  Trim your toenails straight across. Do not dig under them or around the cuticle. File the edges of your nails with an emery board or nail file.  Do not cut corns or calluses or try to remove them with medicine.  Wear clean socks or stockings every day. Make sure they are not too tight. Do not wear knee-high stockings since they may decrease blood flow to your legs.  Wear shoes that fit properly and have enough cushioning. To break in new shoes, wear them for just a few hours a day. This prevents you from injuring your feet. Always look in your shoes before you put them  on to be sure there are no objects inside.  Do not cross your legs. This may decrease the blood flow to your feet.  If you find a minor scrape, cut, or break in the skin on your feet, keep it and the skin around it clean and dry. These areas may be cleansed with mild soap and water. Do not cleanse the area with peroxide, alcohol, or iodine.  When you remove an adhesive bandage, be sure not to damage the skin around it.  If you have a wound, look at it several times a day to make sure it is healing.  Do not use heating pads or hot water bottles. They may burn your skin. If you have lost feeling in your feet or legs, you may not know it is happening until it is too late.  Make sure your health care provider performs a complete foot exam at least annually or more often if you have foot problems. Report any cuts, sores, or bruises to your health care provider immediately. SEEK MEDICAL CARE IF:   You have an injury that is not healing.  You have cuts or breaks in the skin.  You have an ingrown nail.  You notice redness on your legs or feet.  You feel burning or tingling in your legs or feet.  You have pain or cramps in your legs and feet.  Your  legs or feet are numb.  Your feet always feel cold. SEEK IMMEDIATE MEDICAL CARE IF:   There is increasing redness, swelling, or pain in or around a wound.  There is a red line that goes up your leg.  Pus is coming from a wound.  You develop a fever or as directed by your health care provider.  You notice a bad smell coming from an ulcer or wound.   This information is not intended to replace advice given to you by your health care provider. Make sure you discuss any questions you have with your health care provider.   Document Released: 09/11/2000 Document Revised: 05/17/2013 Document Reviewed: 02/21/2013 Elsevier Interactive Patient Education [employer].

## 2016-02-13 DIAGNOSIS — I10 Essential (primary) hypertension: Secondary | ICD-10-CM | POA: Diagnosis not present

## 2016-02-13 DIAGNOSIS — E1142 Type 2 diabetes mellitus with diabetic polyneuropathy: Secondary | ICD-10-CM | POA: Diagnosis not present

## 2016-02-13 DIAGNOSIS — E538 Deficiency of other specified B group vitamins: Secondary | ICD-10-CM | POA: Diagnosis not present

## 2016-02-19 DIAGNOSIS — R531 Weakness: Secondary | ICD-10-CM | POA: Diagnosis not present

## 2016-03-11 DIAGNOSIS — R531 Weakness: Secondary | ICD-10-CM | POA: Diagnosis not present

## 2016-04-15 DIAGNOSIS — E538 Deficiency of other specified B group vitamins: Secondary | ICD-10-CM | POA: Diagnosis not present

## 2016-04-22 ENCOUNTER — Ambulatory Visit (INDEPENDENT_AMBULATORY_CARE_PROVIDER_SITE_OTHER): Payer: Medicare Other | Admitting: Podiatry

## 2016-04-22 ENCOUNTER — Encounter: Payer: Self-pay | Admitting: Podiatry

## 2016-04-22 DIAGNOSIS — M79674 Pain in right toe(s): Secondary | ICD-10-CM | POA: Diagnosis not present

## 2016-04-22 DIAGNOSIS — M79675 Pain in left toe(s): Secondary | ICD-10-CM | POA: Diagnosis not present

## 2016-04-22 DIAGNOSIS — B351 Tinea unguium: Secondary | ICD-10-CM | POA: Diagnosis not present

## 2016-04-22 NOTE — Patient Instructions (Signed)
Remove Band-Aids on third, fourth toes in 1-3 days and apply topical antibiotic ointment daily until a scab forms  Diabetes and Foot Care Diabetes may cause you to have problems because of poor blood supply (circulation) to your feet and legs. This may cause the skin on your feet to become thinner, break easier, and heal more slowly. Your skin may become dry, and the skin may peel and crack. You may also have nerve damage in your legs and feet causing decreased feeling in them. You may not notice minor injuries to your feet that could lead to infections or more serious problems. Taking care of your feet is one of the most important things you can do for yourself.  HOME CARE INSTRUCTIONS  Wear shoes at all times, even in the house. Do not go barefoot. Bare feet are easily injured.  Check your feet daily for blisters, cuts, and redness. If you cannot see the bottom of your feet, use a mirror or ask someone for help.  Wash your feet with warm water (do not use hot water) and mild soap. Then pat your feet and the areas between your toes until they are completely dry. Do not soak your feet as this can dry your skin.  Apply a moisturizing lotion or petroleum jelly (that does not contain alcohol and is unscented) to the skin on your feet and to dry, brittle toenails. Do not apply lotion between your toes.  Trim your toenails straight across. Do not dig under them or around the cuticle. File the edges of your nails with an emery board or nail file.  Do not cut corns or calluses or try to remove them with medicine.  Wear clean socks or stockings every day. Make sure they are not too tight. Do not wear knee-high stockings since they may decrease blood flow to your legs.  Wear shoes that fit properly and have enough cushioning. To break in new shoes, wear them for just a few hours a day. This prevents you from injuring your feet. Always look in your shoes before you put them on to be sure there are no  objects inside.  Do not cross your legs. This may decrease the blood flow to your feet.  If you find a minor scrape, cut, or break in the skin on your feet, keep it and the skin around it clean and dry. These areas may be cleansed with mild soap and water. Do not cleanse the area with peroxide, alcohol, or iodine.  When you remove an adhesive bandage, be sure not to damage the skin around it.  If you have a wound, look at it several times a day to make sure it is healing.  Do not use heating pads or hot water bottles. They may burn your skin. If you have lost feeling in your feet or legs, you may not know it is happening until it is too late.  Make sure your health care provider performs a complete foot exam at least annually or more often if you have foot problems. Report any cuts, sores, or bruises to your health care provider immediately. SEEK MEDICAL CARE IF:   You have an injury that is not healing.  You have cuts or breaks in the skin.  You have an ingrown nail.  You notice redness on your legs or feet.  You feel burning or tingling in your legs or feet.  You have pain or cramps in your legs and feet.  Your legs or  feet are numb.  Your feet always feel cold. SEEK IMMEDIATE MEDICAL CARE IF:   There is increasing redness, swelling, or pain in or around a wound.  There is a red line that goes up your leg.  Pus is coming from a wound.  You develop a fever or as directed by your health care provider.  You notice a bad smell coming from an ulcer or wound.   This information is not intended to replace advice given to you by your health care provider. Make sure you discuss any questions you have with your health care provider.   Document Released: 09/11/2000 Document Revised: 05/17/2013 Document Reviewed: 02/21/2013 Elsevier Interactive Patient Education [employer].

## 2016-04-23 NOTE — Progress Notes (Signed)
Patient ID: Nicole Blake, female   DOB: 05/22/1915, 82 y.o.   MRN: [id number] Subjective: This patient presents today with her niece who is present in the treatment room who is speaking for the patient. The patient's niece states that her aunt has had ongoing diabetic shoes and is requesting replacement diabetic shoes. Her former podiatrist does not dispensed diabetic shoes and this is the reason that she presents today. Also patient's niece states that her aunt has periodic debridement of her toenails, however this was done recently at the other podiatrist office and they do not request this service. Patient's niece states that her diabetic doctor is Dr. Gildardo Cranker and they have an appointment scheduled within the next several days Patient's niece denies any history of foot ulceration, claudication or amputation  Objective:  Physical Exam  Patient appears confused and has difficulty responding to questioning  Vascular: Bilateral peripheral pitting edema DP pulses 1/4 bilaterally PT pulses 1/4 bilaterally Capillary reflex delayed bilaterally  Neurological: Sensation to 10 g monofilament wire patient has difficulty responding Sensation to tuning for patient has difficulty responding Ankle reflexes equal and reactive bilaterally  Dermatological: Toenails 6-10 are hypertrophic, elongated and tender to direct palpation 6-10 No open skin lesions bilaterally Atrophic skin bilaterally The toenails appear trimmed with deformity and texture and color changes  Musculoskeletal: HAV deformities bilaterally Hammertoe deformity 2-5 bilaterally Patient has soft diabetic shoes with Velcro closures with custom  Assessment: Symptomatic onychomycoses 6-10 Patient with a history of diabetes under management of Dr. Gildardo Cranker Decrease pedal pulses bilaterally Unable to evaluate sensory perception bilaterally HAV bilaterally Hammertoes 2-5 bilaterally  Plan: Debrided toenails 6-10  mechanically and electrically with slight bleeding third and fourth left toes treated with antibiotic ointment and Band-Aids. Instructed patient remove Band-Aids 1-3 days continue apply topical antibiotic ointment and Band-Aids until a scab forms  Reappoint 3 months

## 2016-05-13 DIAGNOSIS — R531 Weakness: Secondary | ICD-10-CM | POA: Diagnosis not present

## 2016-05-28 DIAGNOSIS — S8992XA Unspecified injury of left lower leg, initial encounter: Secondary | ICD-10-CM | POA: Diagnosis not present

## 2016-06-03 DIAGNOSIS — R531 Weakness: Secondary | ICD-10-CM | POA: Diagnosis not present

## 2016-06-11 DIAGNOSIS — E538 Deficiency of other specified B group vitamins: Secondary | ICD-10-CM | POA: Diagnosis not present

## 2016-06-11 DIAGNOSIS — F0390 Unspecified dementia without behavioral disturbance: Secondary | ICD-10-CM | POA: Diagnosis not present

## 2016-06-11 DIAGNOSIS — M179 Osteoarthritis of knee, unspecified: Secondary | ICD-10-CM | POA: Diagnosis not present

## 2016-06-11 DIAGNOSIS — Z0001 Encounter for general adult medical examination with abnormal findings: Secondary | ICD-10-CM | POA: Diagnosis not present

## 2016-06-11 DIAGNOSIS — E1142 Type 2 diabetes mellitus with diabetic polyneuropathy: Secondary | ICD-10-CM | POA: Diagnosis not present

## 2016-06-11 DIAGNOSIS — M25569 Pain in unspecified knee: Secondary | ICD-10-CM | POA: Diagnosis not present

## 2016-06-11 DIAGNOSIS — Z23 Encounter for immunization: Secondary | ICD-10-CM | POA: Diagnosis not present

## 2016-06-11 DIAGNOSIS — I10 Essential (primary) hypertension: Secondary | ICD-10-CM | POA: Diagnosis not present

## 2016-06-12 DIAGNOSIS — E538 Deficiency of other specified B group vitamins: Secondary | ICD-10-CM | POA: Diagnosis not present

## 2016-06-12 DIAGNOSIS — Z23 Encounter for immunization: Secondary | ICD-10-CM | POA: Diagnosis not present

## 2016-06-12 DIAGNOSIS — E1142 Type 2 diabetes mellitus with diabetic polyneuropathy: Secondary | ICD-10-CM | POA: Diagnosis not present

## 2016-06-12 DIAGNOSIS — I10 Essential (primary) hypertension: Secondary | ICD-10-CM | POA: Diagnosis not present

## 2016-06-12 DIAGNOSIS — M25569 Pain in unspecified knee: Secondary | ICD-10-CM | POA: Diagnosis not present

## 2016-06-12 DIAGNOSIS — M179 Osteoarthritis of knee, unspecified: Secondary | ICD-10-CM | POA: Diagnosis not present

## 2016-06-12 DIAGNOSIS — Z0001 Encounter for general adult medical examination with abnormal findings: Secondary | ICD-10-CM | POA: Diagnosis not present

## 2016-06-12 DIAGNOSIS — F0390 Unspecified dementia without behavioral disturbance: Secondary | ICD-10-CM | POA: Diagnosis not present

## 2016-06-13 DIAGNOSIS — E785 Hyperlipidemia, unspecified: Secondary | ICD-10-CM | POA: Diagnosis not present

## 2016-06-13 DIAGNOSIS — M1712 Unilateral primary osteoarthritis, left knee: Secondary | ICD-10-CM | POA: Diagnosis not present

## 2016-06-13 DIAGNOSIS — F0390 Unspecified dementia without behavioral disturbance: Secondary | ICD-10-CM | POA: Diagnosis not present

## 2016-06-13 DIAGNOSIS — E1142 Type 2 diabetes mellitus with diabetic polyneuropathy: Secondary | ICD-10-CM | POA: Diagnosis not present

## 2016-06-13 DIAGNOSIS — E538 Deficiency of other specified B group vitamins: Secondary | ICD-10-CM | POA: Diagnosis not present

## 2016-06-13 DIAGNOSIS — I10 Essential (primary) hypertension: Secondary | ICD-10-CM | POA: Diagnosis not present

## 2016-06-15 DIAGNOSIS — M1712 Unilateral primary osteoarthritis, left knee: Secondary | ICD-10-CM | POA: Diagnosis not present

## 2016-06-15 DIAGNOSIS — E538 Deficiency of other specified B group vitamins: Secondary | ICD-10-CM | POA: Diagnosis not present

## 2016-06-15 DIAGNOSIS — E1142 Type 2 diabetes mellitus with diabetic polyneuropathy: Secondary | ICD-10-CM | POA: Diagnosis not present

## 2016-06-15 DIAGNOSIS — F0390 Unspecified dementia without behavioral disturbance: Secondary | ICD-10-CM | POA: Diagnosis not present

## 2016-06-15 DIAGNOSIS — I10 Essential (primary) hypertension: Secondary | ICD-10-CM | POA: Diagnosis not present

## 2016-06-15 DIAGNOSIS — E785 Hyperlipidemia, unspecified: Secondary | ICD-10-CM | POA: Diagnosis not present

## 2016-06-16 DIAGNOSIS — E1142 Type 2 diabetes mellitus with diabetic polyneuropathy: Secondary | ICD-10-CM | POA: Diagnosis not present

## 2016-06-16 DIAGNOSIS — M1712 Unilateral primary osteoarthritis, left knee: Secondary | ICD-10-CM | POA: Diagnosis not present

## 2016-06-16 DIAGNOSIS — I10 Essential (primary) hypertension: Secondary | ICD-10-CM | POA: Diagnosis not present

## 2016-06-16 DIAGNOSIS — F0390 Unspecified dementia without behavioral disturbance: Secondary | ICD-10-CM | POA: Diagnosis not present

## 2016-06-16 DIAGNOSIS — E785 Hyperlipidemia, unspecified: Secondary | ICD-10-CM | POA: Diagnosis not present

## 2016-06-16 DIAGNOSIS — E538 Deficiency of other specified B group vitamins: Secondary | ICD-10-CM | POA: Diagnosis not present

## 2016-06-17 DIAGNOSIS — E1142 Type 2 diabetes mellitus with diabetic polyneuropathy: Secondary | ICD-10-CM | POA: Diagnosis not present

## 2016-06-17 DIAGNOSIS — M1712 Unilateral primary osteoarthritis, left knee: Secondary | ICD-10-CM | POA: Diagnosis not present

## 2016-06-17 DIAGNOSIS — F0390 Unspecified dementia without behavioral disturbance: Secondary | ICD-10-CM | POA: Diagnosis not present

## 2016-06-17 DIAGNOSIS — E538 Deficiency of other specified B group vitamins: Secondary | ICD-10-CM | POA: Diagnosis not present

## 2016-06-17 DIAGNOSIS — E785 Hyperlipidemia, unspecified: Secondary | ICD-10-CM | POA: Diagnosis not present

## 2016-06-17 DIAGNOSIS — I10 Essential (primary) hypertension: Secondary | ICD-10-CM | POA: Diagnosis not present

## 2016-06-19 ENCOUNTER — Emergency Department (HOSPITAL_COMMUNITY): Payer: Medicare Other

## 2016-06-19 ENCOUNTER — Encounter (HOSPITAL_COMMUNITY): Payer: Self-pay | Admitting: Emergency Medicine

## 2016-06-19 ENCOUNTER — Inpatient Hospital Stay (HOSPITAL_COMMUNITY)
Admission: EM | Admit: 2016-06-19 | Discharge: 2016-06-22 | DRG: 470 | Disposition: A | Discharge status: Skilled Nursing Facility | Payer: Medicare Other | Attending: Internal Medicine | Admitting: Internal Medicine

## 2016-06-19 DIAGNOSIS — W19XXXA Unspecified fall, initial encounter: Secondary | ICD-10-CM | POA: Diagnosis present

## 2016-06-19 DIAGNOSIS — S51812A Laceration without foreign body of left forearm, initial encounter: Secondary | ICD-10-CM | POA: Diagnosis present

## 2016-06-19 DIAGNOSIS — Z882 Allergy status to sulfonamides status: Secondary | ICD-10-CM

## 2016-06-19 DIAGNOSIS — S72002A Fracture of unspecified part of neck of left femur, initial encounter for closed fracture: Secondary | ICD-10-CM | POA: Diagnosis not present

## 2016-06-19 DIAGNOSIS — R1311 Dysphagia, oral phase: Secondary | ICD-10-CM | POA: Diagnosis not present

## 2016-06-19 DIAGNOSIS — M6281 Muscle weakness (generalized): Secondary | ICD-10-CM | POA: Diagnosis not present

## 2016-06-19 DIAGNOSIS — Z471 Aftercare following joint replacement surgery: Secondary | ICD-10-CM | POA: Diagnosis not present

## 2016-06-19 DIAGNOSIS — M25552 Pain in left hip: Secondary | ICD-10-CM | POA: Diagnosis not present

## 2016-06-19 DIAGNOSIS — R011 Cardiac murmur, unspecified: Secondary | ICD-10-CM | POA: Diagnosis present

## 2016-06-19 DIAGNOSIS — Z66 Do not resuscitate: Secondary | ICD-10-CM | POA: Diagnosis present

## 2016-06-19 DIAGNOSIS — S72092A Other fracture of head and neck of left femur, initial encounter for closed fracture: Secondary | ICD-10-CM | POA: Diagnosis not present

## 2016-06-19 DIAGNOSIS — F0391 Unspecified dementia with behavioral disturbance: Secondary | ICD-10-CM | POA: Diagnosis present

## 2016-06-19 DIAGNOSIS — Z09 Encounter for follow-up examination after completed treatment for conditions other than malignant neoplasm: Secondary | ICD-10-CM

## 2016-06-19 DIAGNOSIS — F03918 Unspecified dementia, unspecified severity, with other behavioral disturbance: Secondary | ICD-10-CM | POA: Diagnosis present

## 2016-06-19 DIAGNOSIS — I10 Essential (primary) hypertension: Secondary | ICD-10-CM | POA: Diagnosis not present

## 2016-06-19 DIAGNOSIS — Z6821 Body mass index (BMI) 21.0-21.9, adult: Secondary | ICD-10-CM | POA: Diagnosis not present

## 2016-06-19 DIAGNOSIS — S51819A Laceration without foreign body of unspecified forearm, initial encounter: Secondary | ICD-10-CM | POA: Diagnosis present

## 2016-06-19 DIAGNOSIS — E119 Type 2 diabetes mellitus without complications: Secondary | ICD-10-CM

## 2016-06-19 DIAGNOSIS — R262 Difficulty in walking, not elsewhere classified: Secondary | ICD-10-CM | POA: Diagnosis not present

## 2016-06-19 DIAGNOSIS — M8588 Other specified disorders of bone density and structure, other site: Secondary | ICD-10-CM | POA: Diagnosis not present

## 2016-06-19 DIAGNOSIS — S299XXA Unspecified injury of thorax, initial encounter: Secondary | ICD-10-CM | POA: Diagnosis not present

## 2016-06-19 DIAGNOSIS — F0390 Unspecified dementia without behavioral disturbance: Secondary | ICD-10-CM | POA: Diagnosis present

## 2016-06-19 DIAGNOSIS — Z888 Allergy status to other drugs, medicaments and biological substances status: Secondary | ICD-10-CM | POA: Diagnosis not present

## 2016-06-19 DIAGNOSIS — S72009A Fracture of unspecified part of neck of unspecified femur, initial encounter for closed fracture: Secondary | ICD-10-CM

## 2016-06-19 DIAGNOSIS — E440 Moderate protein-calorie malnutrition: Secondary | ICD-10-CM | POA: Diagnosis present

## 2016-06-19 DIAGNOSIS — Z419 Encounter for procedure for purposes other than remedying health state, unspecified: Secondary | ICD-10-CM

## 2016-06-19 DIAGNOSIS — Z9181 History of falling: Secondary | ICD-10-CM | POA: Diagnosis not present

## 2016-06-19 DIAGNOSIS — S72012A Unspecified intracapsular fracture of left femur, initial encounter for closed fracture: Principal | ICD-10-CM | POA: Diagnosis present

## 2016-06-19 DIAGNOSIS — S72002D Fracture of unspecified part of neck of left femur, subsequent encounter for closed fracture with routine healing: Secondary | ICD-10-CM | POA: Diagnosis not present

## 2016-06-19 DIAGNOSIS — Z96642 Presence of left artificial hip joint: Secondary | ICD-10-CM | POA: Diagnosis not present

## 2016-06-19 HISTORY — DX: Unspecified dementia, unspecified severity, without behavioral disturbance, psychotic disturbance, mood disturbance, and anxiety: F03.90

## 2016-06-19 LAB — CBC WITH DIFFERENTIAL/PLATELET
BASOS ABS: 0 10*3/uL (ref 0.0–0.1)
BASOS PCT: 0 %
Eosinophils Absolute: 0.2 10*3/uL (ref 0.0–0.7)
Eosinophils Relative: 2 %
HEMATOCRIT: 40.1 % (ref 36.0–46.0)
HEMOGLOBIN: 13 g/dL (ref 12.0–15.0)
LYMPHS PCT: 17 %
Lymphs Abs: 1.8 10*3/uL (ref 0.7–4.0)
MCH: 28.8 pg (ref 26.0–34.0)
MCHC: 32.4 g/dL (ref 30.0–36.0)
MCV: 88.7 fL (ref 78.0–100.0)
MONO ABS: 0.8 10*3/uL (ref 0.1–1.0)
Monocytes Relative: 8 %
NEUTROS ABS: 7.8 10*3/uL — AB (ref 1.7–7.7)
NEUTROS PCT: 73 %
Platelets: 282 10*3/uL (ref 150–400)
RBC: 4.52 MIL/uL (ref 3.87–5.11)
RDW: 14 % (ref 11.5–15.5)
WBC: 10.5 10*3/uL (ref 4.0–10.5)

## 2016-06-19 LAB — BASIC METABOLIC PANEL
Anion gap: 10 (ref 5–15)
BUN: 23 mg/dL — AB (ref 4–21)
BUN: 23 mg/dL — ABNORMAL HIGH (ref 6–20)
CHLORIDE: 104 mmol/L (ref 101–111)
CO2: 25 mmol/L (ref 22–32)
CREATININE: 1 mg/dL (ref 0.5–1.1)
CREATININE: 1.02 mg/dL — AB (ref 0.44–1.00)
Calcium: 9.4 mg/dL (ref 8.9–10.3)
GFR, EST AFRICAN AMERICAN: 51 mL/min — AB (ref >60)
GFR, EST NON AFRICAN AMERICAN: 44 mL/min — AB (ref >60)
GLUCOSE: 146 mg/dL
Glucose, Bld: 146 mg/dL — ABNORMAL HIGH (ref 65–99)
POTASSIUM: 4.1 mmol/L (ref 3.5–5.1)
Potassium: 4.1 mmol/L (ref 3.4–5.3)
SODIUM: 139 mmol/L (ref 135–145)
Sodium: 139 mmol/L (ref 137–147)

## 2016-06-19 LAB — CBC AND DIFFERENTIAL
HEMATOCRIT: 40 % (ref 36–46)
Hemoglobin: 13 g/dL (ref 12.0–16.0)
PLATELETS: 282 10*3/uL (ref 150–399)
WBC: 10.5 10*3/mL

## 2016-06-19 LAB — APTT: APTT: 27 s (ref 24–36)

## 2016-06-19 LAB — ABO/RH: ABO/RH(D): O POS

## 2016-06-19 LAB — TYPE AND SCREEN
ABO/RH(D): O POS
Antibody Screen: NEGATIVE

## 2016-06-19 LAB — PROTIME-INR
INR: 0.98
PROTHROMBIN TIME: 13 s (ref 11.4–15.2)

## 2016-06-19 MED ORDER — LORAZEPAM 2 MG/ML IJ SOLN: 0.5000 mg | Freq: Three times a day (TID) | INTRAMUSCULAR | Status: DC | PRN

## 2016-06-19 MED ORDER — BISACODYL 5 MG PO TBEC: 5.0000 mg | DELAYED_RELEASE_TABLET | Freq: Every day | ORAL | Status: DC | PRN

## 2016-06-19 MED ORDER — METHOCARBAMOL 500 MG PO TABS: 500.0000 mg | ORAL_TABLET | Freq: Three times a day (TID) | ORAL | Status: DC | PRN

## 2016-06-19 MED ORDER — SODIUM CHLORIDE 0.9 % IV SOLN
INTRAVENOUS | Status: DC
  Administered 2016-06-19: 125 mL/h via INTRAVENOUS

## 2016-06-19 MED ORDER — MORPHINE SULFATE (PF) 2 MG/ML IV SOLN
0.5000 mg | INTRAVENOUS | Status: DC | PRN
  Administered 2016-06-19 – 2016-06-20 (×2): 0.5 mg via INTRAVENOUS
  Filled 2016-06-19 (×2): qty 1

## 2016-06-19 MED ORDER — OXYCODONE-ACETAMINOPHEN 5-325 MG PO TABS: 1.0000 | ORAL_TABLET | ORAL | Status: DC | PRN

## 2016-06-19 MED ORDER — SODIUM CHLORIDE 0.9 % IV SOLN
INTRAVENOUS | Status: DC
  Administered 2016-06-19 – 2016-06-21 (×2): via INTRAVENOUS

## 2016-06-19 MED ORDER — HYDRALAZINE HCL 20 MG/ML IJ SOLN: 5.0000 mg | INTRAMUSCULAR | Status: DC | PRN

## 2016-06-19 MED ORDER — ONDANSETRON HCL 4 MG/2ML IJ SOLN: 4.0000 mg | Freq: Three times a day (TID) | INTRAMUSCULAR | Status: DC | PRN

## 2016-06-19 MED ORDER — FENTANYL CITRATE (PF) 100 MCG/2ML IJ SOLN
50.0000 ug | INTRAMUSCULAR | Status: DC | PRN
  Administered 2016-06-19: 50 ug via INTRAVENOUS
  Filled 2016-06-19: qty 2

## 2016-06-19 MED ORDER — MUPIROCIN 2 % EX OINT
1.0000 "application " | TOPICAL_OINTMENT | Freq: Every day | CUTANEOUS | Status: DC
  Administered 2016-06-20 – 2016-06-22 (×3): 1 via NASAL
  Filled 2016-06-19: qty 22

## 2016-06-19 MED ORDER — SENNOSIDES-DOCUSATE SODIUM 8.6-50 MG PO TABS: 1.0000 | ORAL_TABLET | Freq: Every evening | ORAL | Status: DC | PRN

## 2016-06-19 MED ORDER — VITAMIN D 1000 UNITS PO TABS
2000.0000 [IU] | ORAL_TABLET | Freq: Every day | ORAL | Status: DC
  Administered 2016-06-21 – 2016-06-22 (×2): 2000 [IU] via ORAL
  Filled 2016-06-19 (×2): qty 2

## 2016-06-19 MED ORDER — ACETAMINOPHEN 325 MG PO TABS: 650.0000 mg | ORAL_TABLET | Freq: Four times a day (QID) | ORAL | Status: DC | PRN

## 2016-06-19 MED ORDER — ZOLPIDEM TARTRATE 5 MG PO TABS: 5.0000 mg | ORAL_TABLET | Freq: Every evening | ORAL | Status: DC | PRN

## 2016-06-19 NOTE — ED Notes (Signed)
Attempted IV access x2, both unsuccessful. 

## 2016-06-19 NOTE — ED Provider Notes (Signed)
[facility]-EMERGENCY DEPT Provider Note   CSN: [id number] Arrival date & time: 06/19/16  1548     History   Chief Complaint Chief Complaint  Patient presents with  . Hip Injury    HPI Nicole Blake is a 82 y.o. female.  HPI Patient presents with left hip fracture. Sent in from [facility] for admission. Has a left subcapital fracture. Scheduled for surgery in the morning. She had a fall 23 days ago. Had a negative knee x-ray through [facility]. Followed up today and found the hip fracture. Continued pain. No other injury but does have a skin tear on the left forearm. Has had that last few days. Has not eaten yet today.   Past Medical History:  Diagnosis Date  . Diabetes mellitus without complication (HCC)   . Hypertension     Patient Active Problem List   Diagnosis Date Noted  . Aspiration pneumonia (HCC)   . Nausea and vomiting 12/05/2014  . Emesis 12/05/2014  . CAP (community acquired pneumonia) 12/05/2014    History reviewed. No pertinent surgical history.  OB History    No data available       Home Medications    Prior to Admission medications   Medication Sig Start Date End Date Taking? Authorizing Provider  acetaminophen (TYLENOL) 325 MG tablet Take 650 mg by mouth every 6 (six) hours as needed for moderate pain.   Yes Historical Provider, MD  Cholecalciferol (VITAMIN D) 2000 units CAPS Take 2,000 Units by mouth daily.   Yes Historical Provider, MD  mupirocin ointment (BACTROBAN) 2 % Place 1 application into the nose daily.   Yes Historical Provider, MD    Family History No family history on file.  Social History Social History  Substance Use Topics  . Smoking status: Never Smoker  . Smokeless tobacco: Never Used  . Alcohol use No     Allergies   Doxycycline; Robitussin (alcohol free) [guaifenesin]; Septra [sulfamethoxazole-trimethoprim]; and Sulfa antibiotics   Review of Systems Review of Systems  Constitutional: Negative for appetite  change.  Respiratory: Negative for shortness of breath.   Cardiovascular: Negative for chest pain.  Gastrointestinal: Negative for abdominal pain.  Genitourinary: Negative for dyspareunia.  Musculoskeletal: Negative for back pain.        left hip pain. Left knee pain.  Skin: Negative for wound.  Neurological: Negative for seizures.  Psychiatric/Behavioral: Negative for confusion.     Physical Exam Updated Vital Signs BP (!) 210/99 (BP Location: Right Arm)   Pulse 96   Temp 97.7 F (36.5 C) (Oral)   Resp 20   SpO2 98%   Physical Exam  Constitutional: She appears well-developed.  HENT:  Head: Atraumatic.  Eyes: EOM are normal.  Neck: Neck supple.  Cardiovascular: Normal rate.   Abdominal: Soft. There is no tenderness.  Musculoskeletal:  Tenderness to left hip and left knee. Patient keeps grabbing left knee.  Neurological: She is alert.  Skin: Skin is warm. Capillary refill takes less than 2 seconds.     ED Treatments / Results  Labs (all labs ordered are listed, but only abnormal results are displayed) Labs Reviewed  BASIC METABOLIC PANEL - Abnormal; Notable for the following:       Result Value   Glucose, Bld 146 (*)    BUN 23 (*)    Creatinine, Ser 1.02 (*)    GFR calc non Af Amer 44 (*)    GFR calc Af Amer 51 (*)    All other components within normal  limits  CBC WITH DIFFERENTIAL/PLATELET - Abnormal; Notable for the following:    Neutro Abs 7.8 (*)    All other components within normal limits  URINE CULTURE  PROTIME-INR  CBC WITH DIFFERENTIAL/PLATELET  URINALYSIS, ROUTINE W REFLEX MICROSCOPIC (NOT AT [facility])  TYPE AND SCREEN    EKG  EKG Interpretation None       Radiology No results found.  Procedures Procedures (including critical care time)  Medications Ordered in ED Medications  fentaNYL (SUBLIMAZE) injection 50 mcg (50 mcg Intravenous Given 06/19/16 1837)  0.9 %  sodium chloride infusion (125 mL/hr Intravenous New Bag/Given 06/19/16 1838)      Initial Impression / Assessment and Plan / ED Course  I have reviewed the triage vital signs and the nursing notes.  Pertinent labs & imaging results that were available during my care of the patient were reviewed by me and considered in my medical decision making (see chart for details).  Clinical Course    Patient with left hip fracture. Found outpatient but discussed with Dr. Lamona Curl requested new x-ray here. Plan were replacement in morning. Admit to medicine.  Final Clinical Impressions(s) / ED Diagnoses   Final diagnoses:  Closed left hip fracture, initial encounter [facility])    New Prescriptions New Prescriptions   No medications on file     Benjiman Core, MD 06/19/16 [phone]

## 2016-06-19 NOTE — H&P (Addendum)
History and Physical    Nicole Blake ZOX:[id number] DOB: 1915/03/29 DOA: 06/19/2016  Referring MD/NP/PA:   PCP:  Duane Lope, MD   Patient coming from:  The patient is coming from home.  At baseline, pt is partially dependent for her ADL.  Chief Complaint: left hip pain after fall  HPI: Nicole Blake is a 82 y.o. female with medical history significant of HTN, diet-controled DM, dementia, who presents with left hip pain after fall.   Patient has dementia, and does not answer questions appropriately, and is unable to provide accurate medical history, therefore, most of the history is obtained by discussing the case with ED physician and with the nursing staff. The history is very limited.  It seems that pt had fall 3 weeks ago. Had a negative knee x-ray through [facility]. Pt was initially diagnosed with a knee ligament injury, but the patient has been unable to put weight on her left leg due to pain since her injury. Pt was seen at [facility] today and was diagnosed with a left hip fracture. Pt has surgery scheduled tomorrow morning.   When I saw pt in ED, she dose not seem to have active cough, nausea, vomiting or diarrhea. She does not seem to have abdominal pain or shortness of breath. She moves all extremities. She has a skin tear on the left forearm and bruises over left arm and left leg. Not sure if she had head or neck injury, but she dose seem to have neck pain.  ED Course: pt was found to have WBC 10.5, INR 0.98, temperature normal, elevated blood pressure 210/99-->199/88, electrolytes and renal function okay, negative chest x-ray. X-ray of left hip/pelvis showed displaced left femoral neck fracture. Patient is admitted to MedSurg bed. Ortho, Dr. Ranell Patrick was consulted by EDP.  Review of Systems: Could not be reviewed due to dementia  Allergy:  Allergies  Allergen Reactions  . Doxycycline Other (See Comments)    Gi- Upset  . Robitussin (Alcohol Free) [Guaifenesin] Other (See  Comments)    Stomach upset  . Septra [Sulfamethoxazole-Trimethoprim] Other (See Comments)    Gi- upset  . Sulfa Antibiotics     pts niece believes she may not be able to take this    Past Medical History:  Diagnosis Date  . Dementia   . Diabetes mellitus without complication (HCC)   . Hypertension     History reviewed. No pertinent surgical history. Could not be reviewed due to dementia  Social History:  reports that she has never smoked. She has never used smokeless tobacco. She reports that she does not drink alcohol. Her drug history is not on file.  Family History: No family history on file. Could not be reviewed due to dementia  Prior to Admission medications   Medication Sig Start Date End Date Taking? Authorizing Provider  acetaminophen (TYLENOL) 325 MG tablet Take 650 mg by mouth every 6 (six) hours as needed for moderate pain.   Yes Historical Provider, MD  Cholecalciferol (VITAMIN D) 2000 units CAPS Take 2,000 Units by mouth daily.   Yes Historical Provider, MD  mupirocin ointment (BACTROBAN) 2 % Place 1 application into the nose daily.   Yes Historical Provider, MD    Physical Exam: Vitals:   06/19/16 1611 06/19/16 1842 06/19/16 1845  BP: 177/84 (!) 210/99   Pulse: 78 96   Resp: 20 20   Temp: 99 F (37.2 C)  97.7 F (36.5 C)  TempSrc: Oral Oral Oral  SpO2: 98%  98%    General: Not in acute distress HEENT:       Eyes: PERRL, EOMI, no scleral icterus.       ENT: No discharge from the ears and nose, no pharynx injection, no tonsillar enlargement.        Neck: No JVD, no bruit, no mass felt. Heme: No neck lymph node enlargement. Cardiac: S1/S2, RRR, 3/6 systolic murmurs, No gallops or rubs. Respiratory: No rales, wheezing, rhonchi or rubs. GI: Soft, nondistended, nontender, no rebound pain, no organomegaly, BS present. GU: No hematuria Ext: No pitting leg edema bilaterally. 2+DP/PT pulse bilaterally. Musculoskeletal: has tenderness over left hip.  Skin: No  rashes. Has skin tear and bruises. Neuro: Alert, not answer questions appropriately, not riented X3, cranial nerves II-XII grossly intact, moves all extremities.Marland Kitchen. Psych: Could not be reviewed due to dementia.  Labs on Admission: I have personally reviewed following labs and imaging studies  CBC:  Recent Labs Lab 06/19/16 1830  WBC 10.5  NEUTROABS 7.8*  HGB 13.0  HCT 40.1  MCV 88.7  PLT 282   Basic Metabolic Panel:  Recent Labs Lab 06/19/16 1727  NA 139  K 4.1  CL 104  CO2 25  GLUCOSE 146*  BUN 23*  CREATININE 1.02*  CALCIUM 9.4   GFR: CrCl cannot be calculated (Unknown ideal weight.). Liver Function Tests: No results for input(s): AST, ALT, ALKPHOS, BILITOT, PROT, ALBUMIN in the last 168 hours. No results for input(s): LIPASE, AMYLASE in the last 168 hours. No results for input(s): AMMONIA in the last 168 hours. Coagulation Profile:  Recent Labs Lab 06/19/16 1727  INR 0.98   Cardiac Enzymes: No results for input(s): CKTOTAL, CKMB, CKMBINDEX, TROPONINI in the last 168 hours. BNP (last 3 results) No results for input(s): PROBNP in the last 8760 hours. HbA1C: No results for input(s): HGBA1C in the last 72 hours. CBG: No results for input(s): GLUCAP in the last 168 hours. Lipid Profile: No results for input(s): CHOL, HDL, LDLCALC, TRIG, CHOLHDL, LDLDIRECT in the last 72 hours. Thyroid Function Tests: No results for input(s): TSH, T4TOTAL, FREET4, T3FREE, THYROIDAB in the last 72 hours. Anemia Panel: No results for input(s): VITAMINB12, FOLATE, FERRITIN, TIBC, IRON, RETICCTPCT in the last 72 hours. Urine analysis:    Component Value Date/Time   COLORURINE YELLOW 12/24/2008 1113   APPEARANCEUR CLOUDY (A) 12/24/2008 1113   LABSPEC 1.015 12/24/2008 1113   PHURINE 7.0 12/24/2008 1113   GLUCOSEU 250 (A) 12/24/2008 1113   HGBUR NEGATIVE 12/24/2008 1113   BILIRUBINUR NEGATIVE 12/24/2008 1113   KETONESUR NEGATIVE 12/24/2008 1113   PROTEINUR NEGATIVE 12/24/2008  1113   UROBILINOGEN 1.0 12/24/2008 1113   NITRITE POSITIVE (A) 12/24/2008 1113   LEUKOCYTESUR MODERATE (A) 12/24/2008 1113   Sepsis Labs: @LABRCNTIP (procalcitonin:4,lacticidven:4) )No results found for this or any previous visit (from the past 240 hour(s)).   Radiological Exams on Admission: Dg Chest 1 View  Result Date: 06/19/2016 CLINICAL DATA:  Larey SeatFell 3 weeks ago.  Right hip pain. EXAM: CHEST 1 VIEW COMPARISON:  12/19/2015 FINDINGS: The heart is normal in size. Stable marked tortuosity and ectasia of the thoracic aorta. The lungs are clear. No pleural effusion. Prominent skin fold noted over the left chest. The bony thorax is intact. No obvious rib fractures. IMPRESSION: No acute cardiopulmonary findings. Electronically Signed   By: Rudie MeyerP.  Gallerani M.D.   On: 06/19/2016 19:09   Dg Hip Unilat W Or Wo Pelvis 2-3 Views Left  Result Date: 06/19/2016 CLINICAL DATA:  Larey SeatFell 3 weeks ago.  Injured left hip. EXAM: DG HIP (WITH OR WITHOUT PELVIS) 2-3V LEFT COMPARISON:  None. FINDINGS: Difficult examination due to the position of the patient. There is a displaced left femoral neck fracture. The right hip is intact. The bony pelvis is grossly intact. IMPRESSION: Displaced left femoral neck fracture. Electronically Signed   By: Rudie Meyer M.D.   On: 06/19/2016 19:10     EKG: Independently reviewed. Sinus rhythm, QTC 481, no ischemic change.  Assessment/Plan Principal Problem:   Displaced fracture of left femoral neck (HCC) Active Problems:   Hypertension   Diabetes mellitus without complication (HCC)   Fall   Closed left hip fracture (HCC)   Skin tear of forearm without complication   Protein-calorie malnutrition, moderate (HCC)   Dementia   Displaced fracture of left femoral neck (HCC): As evidenced by x-ray. Patient has pain, but cannot characterize her pain in detail. No neurovascular compromise. Orthopedic surgeon, Dr. Ranell Patrick was consulted. Pt is scheduled for surgery tomorrow  - will  admit to Med-surg bed - Pain control: morphine prn and percocet - When necessary Zofran for nausea - Robaxin for muscle spasm - type and cross - INR/PTT - NPO after MN -IVF: NS 75 cc/h  Hypertension: Not on medications at home. Blood pressure is elevated at  210/99-->199/88, likely due to pain -IV hydralazine when necessary  Diet controlled diabetes: A1c 6.13/9/16. Not taking medications at home. Blood sugar 146 on admission. -Check blood sugar every morning -If sugar is elevated persistently, restart SSI  Skin tear of forearm without complication: -wound care consult  Protein-calorie malnutrition, moderate (HCC): -Consult to nutrition  Dementia: no behavior disturbance -prn Ativan 0.5 mg q8h for agitation   DVT ppx: SCD Code Status: DRN Family Communication: None at bed side.  Disposition Plan:  Anticipate discharge back to previous home or rehab environment Consults called:  Ortho, Dr. Ranell Patrick Admission status: medical floor/obs   Date of Service 06/19/2016    Lorretta Harp [facility] Pager [phone]  If 7PM-7AM, please contact night-coverage [email or web address] Password [facility] 06/19/2016, 7:54 PM

## 2016-06-19 NOTE — ED Notes (Signed)
MD at bedside. 

## 2016-06-19 NOTE — ED Notes (Signed)
Patient transported to X-ray 

## 2016-06-19 NOTE — ED Triage Notes (Addendum)
Pt from home following a fall 3 weeks ago. Pt was initially diagnosed with a knee ligament injury. Pt has been unable to put weight on her  Left leg since her injury. Pt was re seen at [facility] today and was diagnosed with a left hip fracture. Pt has surgery scheduled tomorrow morning. The surgeon called and would like us to contact whoever is on call for [facility] to let the know pt is here.

## 2016-06-19 NOTE — Consult Note (Signed)
  Nicole Blake is an 82 y.o. female.    Chief Complaint: left hip/knee pain  HPI: 82 y/o female sustained ground level fall 3 weeks ago in [place]. Family brought her to [place] to help take care of her due to inability/difficulty trying to bear weight. Pt had been evaluated for 'knee pain'. Seen today with more accurate diagnosis of left femoral neck fracture with referred pain to her knee. Plan to admit and surgery tomorrow to restore function and reduce pain.  PCP:   Ross, Alan, MD  PMH: Past Medical History:  Diagnosis Date  . Diabetes mellitus without complication (HCC)   . Hypertension     PSH: History reviewed. No pertinent surgical history.  Social History:  reports that she has never smoked. She has never used smokeless tobacco. She reports that she does not drink alcohol. Her drug history is not on file.  Allergies:  Allergies  Allergen Reactions  . Doxycycline Other (See Comments)    Gi- Upset  . Robitussin (Alcohol Free) [Guaifenesin] Other (See Comments)    Stomach upset  . Septra [Sulfamethoxazole-Trimethoprim] Other (See Comments)    Gi- upset  . Sulfa Antibiotics     pts niece believes she may not be able to take this    Medications: Current Facility-Administered Medications  Medication Dose Route Frequency Provider Last Rate Last Dose  . 0.9 %  sodium chloride infusion   Intravenous Continuous Nathan Pickering, MD      . fentaNYL (SUBLIMAZE) injection 50 mcg  50 mcg Intravenous Q30 min PRN Nathan Pickering, MD       Current Outpatient Prescriptions  Medication Sig Dispense Refill  . acetaminophen (TYLENOL) 325 MG tablet Take 650 mg by mouth every 6 (six) hours as needed for moderate pain.    . Cholecalciferol (VITAMIN D) 2000 units CAPS Take 2,000 Units by mouth daily.    . mupirocin ointment (BACTROBAN) 2 % Place 1 application into the nose daily.      No results found for this or any previous visit (from the past 48 hour(s)). No results  found.  ROS: ROS Usually walks with minimal assistance Symptoms for 3 weeks  Physical Exam: Alert but disoriented 82 y/o female in no acute distress Cervical spine with full rom, no tenderness Bilateral upper extremities with full rom, small superficial skin tear left dorsal forearm nv intact distally Bilateral lower extremities with flexion contractures at the knee nv intact distally Physical Exam   Assessment/Plan Assessment: left femoral neck fracture  Plan: Plan to be admitted by hospitalist and surgery scheduled for tomorrow morning NPO Pain management as needed  

## 2016-06-20 ENCOUNTER — Encounter (HOSPITAL_COMMUNITY)
Admission: EM | Disposition: A | Discharge status: Skilled Nursing Facility | Payer: Self-pay | Source: Home / Self Care | Attending: Internal Medicine

## 2016-06-20 ENCOUNTER — Inpatient Hospital Stay (HOSPITAL_COMMUNITY): Payer: Medicare Other | Admitting: Anesthesiology

## 2016-06-20 ENCOUNTER — Inpatient Hospital Stay (HOSPITAL_COMMUNITY): Payer: Medicare Other

## 2016-06-20 DIAGNOSIS — S72002A Fracture of unspecified part of neck of left femur, initial encounter for closed fracture: Secondary | ICD-10-CM | POA: Diagnosis present

## 2016-06-20 HISTORY — PX: ANTERIOR APPROACH HEMI HIP ARTHROPLASTY: SHX6690

## 2016-06-20 LAB — GLUCOSE, CAPILLARY
GLUCOSE-CAPILLARY: 100 mg/dL — AB (ref 65–99)
GLUCOSE-CAPILLARY: 139 mg/dL — AB (ref 65–99)
Glucose-Capillary: 106 mg/dL — ABNORMAL HIGH (ref 65–99)
Glucose-Capillary: 157 mg/dL — ABNORMAL HIGH (ref 65–99)
Glucose-Capillary: 149 mg/dL — ABNORMAL HIGH (ref 65–99)

## 2016-06-20 LAB — URINALYSIS, ROUTINE W REFLEX MICROSCOPIC
Bilirubin Urine: NEGATIVE
GLUCOSE, UA: NEGATIVE mg/dL
HGB URINE DIPSTICK: NEGATIVE
Ketones, ur: NEGATIVE mg/dL
Leukocytes, UA: NEGATIVE
Nitrite: NEGATIVE
PH: 5.5 (ref 5.0–8.0)
PROTEIN: NEGATIVE mg/dL
Specific Gravity, Urine: 1.02 (ref 1.005–1.030)

## 2016-06-20 LAB — MRSA PCR SCREENING: MRSA BY PCR: NEGATIVE

## 2016-06-20 SURGERY — HEMIARTHROPLASTY, HIP, DIRECT ANTERIOR APPROACH, FOR FRACTURE
Anesthesia: General | Site: Hip | Laterality: Left

## 2016-06-20 MED ORDER — SUCCINYLCHOLINE CHLORIDE 200 MG/10ML IV SOSY
PREFILLED_SYRINGE | INTRAVENOUS | Status: DC | PRN
  Administered 2016-06-20: 100 mg via INTRAVENOUS

## 2016-06-20 MED ORDER — FENTANYL CITRATE (PF) 100 MCG/2ML IJ SOLN
INTRAMUSCULAR | Status: AC
  Filled 2016-06-20: qty 2

## 2016-06-20 MED ORDER — ETOMIDATE 2 MG/ML IV SOLN
INTRAVENOUS | Status: DC | PRN
  Administered 2016-06-20: 10 mg via INTRAVENOUS

## 2016-06-20 MED ORDER — SODIUM CHLORIDE 0.9 % IR SOLN
Status: DC | PRN
  Administered 2016-06-20 (×2): 1000 mL

## 2016-06-20 MED ORDER — ONDANSETRON HCL 4 MG/2ML IJ SOLN
INTRAMUSCULAR | Status: DC | PRN
  Administered 2016-06-20: 4 mg via INTRAVENOUS

## 2016-06-20 MED ORDER — ONDANSETRON HCL 4 MG/2ML IJ SOLN
INTRAMUSCULAR | Status: AC
  Filled 2016-06-20: qty 2

## 2016-06-20 MED ORDER — PHENYLEPHRINE HCL 10 MG/ML IJ SOLN
INTRAMUSCULAR | Status: DC | PRN
  Administered 2016-06-20: 80 ug via INTRAVENOUS

## 2016-06-20 MED ORDER — SODIUM CHLORIDE 0.9 % IJ SOLN
INTRAMUSCULAR | Status: DC | PRN
  Administered 2016-06-20: 20 mL

## 2016-06-20 MED ORDER — POVIDONE-IODINE 10 % EX SWAB
2.0000 "application " | Freq: Once | CUTANEOUS | Status: AC
  Administered 2016-06-20: 2 via TOPICAL

## 2016-06-20 MED ORDER — TRANEXAMIC ACID 1000 MG/10ML IV SOLN
1000.0000 mg | INTRAVENOUS | Status: AC
  Administered 2016-06-20: 1000 mg via INTRAVENOUS
  Filled 2016-06-20: qty 10

## 2016-06-20 MED ORDER — BUPIVACAINE HCL (PF) 0.25 % IJ SOLN
INTRAMUSCULAR | Status: AC
  Filled 2016-06-20: qty 30

## 2016-06-20 MED ORDER — SUGAMMADEX SODIUM 200 MG/2ML IV SOLN
INTRAVENOUS | Status: DC | PRN
  Administered 2016-06-20: 150 mg via INTRAVENOUS

## 2016-06-20 MED ORDER — PROPOFOL 10 MG/ML IV BOLUS
INTRAVENOUS | Status: DC | PRN
Start: 2016-06-20 — End: 2016-06-20
  Administered 2016-06-20: 50 mg via INTRAVENOUS

## 2016-06-20 MED ORDER — ACETAMINOPHEN 10 MG/ML IV SOLN
INTRAVENOUS | Status: AC
  Filled 2016-06-20: qty 100

## 2016-06-20 MED ORDER — MENTHOL 3 MG MT LOZG: 1.0000 | LOZENGE | OROMUCOSAL | Status: DC | PRN

## 2016-06-20 MED ORDER — PROPOFOL 10 MG/ML IV BOLUS
INTRAVENOUS | Status: AC
  Filled 2016-06-20: qty 20

## 2016-06-20 MED ORDER — HYDROCODONE-ACETAMINOPHEN 5-325 MG PO TABS: 1.0000 | ORAL_TABLET | Freq: Four times a day (QID) | ORAL | Status: DC | PRN

## 2016-06-20 MED ORDER — CEFAZOLIN SODIUM-DEXTROSE 2-4 GM/100ML-% IV SOLN
2.0000 g | INTRAVENOUS | Status: AC
  Administered 2016-06-20: 2 g via INTRAVENOUS

## 2016-06-20 MED ORDER — CEFAZOLIN SODIUM-DEXTROSE 2-4 GM/100ML-% IV SOLN
INTRAVENOUS | Status: AC
  Filled 2016-06-20: qty 100

## 2016-06-20 MED ORDER — FENTANYL CITRATE (PF) 100 MCG/2ML IJ SOLN: 25.0000 ug | INTRAMUSCULAR | Status: DC | PRN

## 2016-06-20 MED ORDER — ONDANSETRON HCL 4 MG/2ML IJ SOLN: 4.0000 mg | Freq: Four times a day (QID) | INTRAMUSCULAR | Status: DC | PRN

## 2016-06-20 MED ORDER — ISOPROPYL ALCOHOL 70 % SOLN
Status: AC
  Filled 2016-06-20: qty 480

## 2016-06-20 MED ORDER — ROCURONIUM BROMIDE 10 MG/ML (PF) SYRINGE
PREFILLED_SYRINGE | INTRAVENOUS | Status: DC | PRN
  Administered 2016-06-20: 30 mg via INTRAVENOUS

## 2016-06-20 MED ORDER — KETOROLAC TROMETHAMINE 30 MG/ML IJ SOLN
INTRAMUSCULAR | Status: AC
  Filled 2016-06-20: qty 1

## 2016-06-20 MED ORDER — SUGAMMADEX SODIUM 200 MG/2ML IV SOLN
INTRAVENOUS | Status: AC
  Filled 2016-06-20: qty 2

## 2016-06-20 MED ORDER — ROCURONIUM BROMIDE 10 MG/ML (PF) SYRINGE
PREFILLED_SYRINGE | INTRAVENOUS | Status: AC
  Filled 2016-06-20: qty 10

## 2016-06-20 MED ORDER — ASPIRIN EC 81 MG PO TBEC
81.0000 mg | DELAYED_RELEASE_TABLET | Freq: Two times a day (BID) | ORAL | Status: DC
  Administered 2016-06-20 – 2016-06-22 (×4): 81 mg via ORAL
  Filled 2016-06-20 (×4): qty 1

## 2016-06-20 MED ORDER — LIDOCAINE 2% (20 MG/ML) 5 ML SYRINGE
INTRAMUSCULAR | Status: AC
  Filled 2016-06-20: qty 5

## 2016-06-20 MED ORDER — CEFAZOLIN SODIUM-DEXTROSE 2-4 GM/100ML-% IV SOLN
2.0000 g | INTRAVENOUS | Status: DC
Start: 2016-06-20 — End: 2016-06-20

## 2016-06-20 MED ORDER — INSULIN ASPART 100 UNIT/ML ~~LOC~~ SOLN
0.0000 [IU] | Freq: Three times a day (TID) | SUBCUTANEOUS | Status: DC
  Administered 2016-06-20 – 2016-06-21 (×2): 2 [IU] via SUBCUTANEOUS

## 2016-06-20 MED ORDER — FENTANYL CITRATE (PF) 100 MCG/2ML IJ SOLN
INTRAMUSCULAR | Status: DC | PRN
  Administered 2016-06-20 (×4): 25 ug via INTRAVENOUS

## 2016-06-20 MED ORDER — KETOROLAC TROMETHAMINE 30 MG/ML IJ SOLN
INTRAMUSCULAR | Status: DC | PRN
  Administered 2016-06-20: 30 mg via INTRAMUSCULAR

## 2016-06-20 MED ORDER — SODIUM CHLORIDE 0.9 % IJ SOLN
INTRAMUSCULAR | Status: AC
  Filled 2016-06-20: qty 50

## 2016-06-20 MED ORDER — METOCLOPRAMIDE HCL 5 MG PO TABS: 5.0000 mg | ORAL_TABLET | Freq: Three times a day (TID) | ORAL | Status: DC | PRN

## 2016-06-20 MED ORDER — ACETAMINOPHEN 10 MG/ML IV SOLN
1000.0000 mg | INTRAVENOUS | Status: AC
  Administered 2016-06-20: 1000 mg via INTRAVENOUS

## 2016-06-20 MED ORDER — ISOPROPYL ALCOHOL 70 % SOLN
Status: DC | PRN
Start: 2016-06-20 — End: 2016-06-20
  Administered 2016-06-20: 1 via TOPICAL

## 2016-06-20 MED ORDER — ONDANSETRON HCL 4 MG PO TABS: 4.0000 mg | ORAL_TABLET | Freq: Four times a day (QID) | ORAL | Status: DC | PRN

## 2016-06-20 MED ORDER — STERILE WATER FOR IRRIGATION IR SOLN
Status: DC | PRN
  Administered 2016-06-20: 2000 mL

## 2016-06-20 MED ORDER — LACTATED RINGERS IV SOLN
INTRAVENOUS | Status: DC | PRN
  Administered 2016-06-20 (×2): via INTRAVENOUS

## 2016-06-20 MED ORDER — PHENYLEPHRINE 40 MCG/ML (10ML) SYRINGE FOR IV PUSH (FOR BLOOD PRESSURE SUPPORT)
PREFILLED_SYRINGE | INTRAVENOUS | Status: AC
  Filled 2016-06-20: qty 10

## 2016-06-20 MED ORDER — CEFAZOLIN SODIUM-DEXTROSE 2-4 GM/100ML-% IV SOLN: 2.0000 g | Freq: Four times a day (QID) | INTRAVENOUS | Status: DC

## 2016-06-20 MED ORDER — ACETAMINOPHEN 325 MG PO TABS
650.0000 mg | ORAL_TABLET | Freq: Four times a day (QID) | ORAL | Status: DC | PRN
  Administered 2016-06-21 – 2016-06-22 (×5): 650 mg via ORAL
  Filled 2016-06-20 (×5): qty 2

## 2016-06-20 MED ORDER — PHENOL 1.4 % MT LIQD: 1.0000 | OROMUCOSAL | Status: DC | PRN

## 2016-06-20 MED ORDER — DEXAMETHASONE SODIUM PHOSPHATE 10 MG/ML IJ SOLN
INTRAMUSCULAR | Status: DC | PRN
  Administered 2016-06-20: 10 mg via INTRAVENOUS

## 2016-06-20 MED ORDER — ACETAMINOPHEN 650 MG RE SUPP
650.0000 mg | Freq: Four times a day (QID) | RECTAL | Status: DC | PRN
Start: 2016-06-20 — End: 2016-06-22

## 2016-06-20 MED ORDER — 0.9 % SODIUM CHLORIDE (POUR BTL) OPTIME
TOPICAL | Status: DC | PRN
  Administered 2016-06-20: 1000 mL

## 2016-06-20 MED ORDER — CHLORHEXIDINE GLUCONATE 4 % EX LIQD
60.0000 mL | Freq: Once | CUTANEOUS | Status: DC
  Filled 2016-06-20: qty 60

## 2016-06-20 MED ORDER — LIDOCAINE 2% (20 MG/ML) 5 ML SYRINGE
INTRAMUSCULAR | Status: DC | PRN
  Administered 2016-06-20: 100 mg via INTRAVENOUS

## 2016-06-20 MED ORDER — BUPIVACAINE HCL (PF) 0.25 % IJ SOLN
INTRAMUSCULAR | Status: DC | PRN
  Administered 2016-06-20: 30 mL

## 2016-06-20 MED ORDER — ETOMIDATE 2 MG/ML IV SOLN
INTRAVENOUS | Status: AC
  Filled 2016-06-20: qty 10

## 2016-06-20 MED ORDER — DEXAMETHASONE SODIUM PHOSPHATE 10 MG/ML IJ SOLN
INTRAMUSCULAR | Status: AC
Start: 2016-06-20 — End: 2016-06-20
  Filled 2016-06-20: qty 1

## 2016-06-20 MED ORDER — METOCLOPRAMIDE HCL 5 MG/ML IJ SOLN: 5.0000 mg | Freq: Three times a day (TID) | INTRAMUSCULAR | Status: DC | PRN

## 2016-06-20 MED ORDER — CEFAZOLIN SODIUM-DEXTROSE 2-4 GM/100ML-% IV SOLN
2.0000 g | Freq: Once | INTRAVENOUS | Status: AC
  Administered 2016-06-20: 2 g via INTRAVENOUS
  Filled 2016-06-20: qty 100

## 2016-06-20 SURGICAL SUPPLY — 41 items
BAG ZIPLOCK 12X15 (MISCELLANEOUS) ×3 IMPLANT
CAPT HIP HEMI 2 ×3 IMPLANT
CHLORAPREP W/TINT 26ML (MISCELLANEOUS) ×3 IMPLANT
CLOTH BEACON ORANGE TIMEOUT ST (SAFETY) ×3 IMPLANT
COVER PERINEAL POST (MISCELLANEOUS) ×3 IMPLANT
DECANTER SPIKE VIAL GLASS SM (MISCELLANEOUS) ×3 IMPLANT
DRAPE SHEET LG 3/4 BI-LAMINATE (DRAPES) ×6 IMPLANT
DRAPE STERI IOBAN 125X83 (DRAPES) ×3 IMPLANT
DRAPE U-SHAPE 47X51 STRL (DRAPES) ×6 IMPLANT
DRSG AQUACEL AG ADV 3.5X10 (GAUZE/BANDAGES/DRESSINGS) ×3 IMPLANT
DRSG TEGADERM 4X4.75 (GAUZE/BANDAGES/DRESSINGS) IMPLANT
ELECT PENCIL ROCKER SW 15FT (MISCELLANEOUS) ×3 IMPLANT
ELECT REM PT RETURN 15FT ADLT (MISCELLANEOUS) ×3 IMPLANT
ELECT REM PT RETURN 9FT ADLT (ELECTROSURGICAL) ×3
ELECTRODE REM PT RTRN 9FT ADLT (ELECTROSURGICAL) ×1 IMPLANT
EVACUATOR 1/8 PVC DRAIN (DRAIN) IMPLANT
GAUZE SPONGE 2X2 8PLY STRL LF (GAUZE/BANDAGES/DRESSINGS) IMPLANT
GAUZE SPONGE 4X4 12PLY STRL (GAUZE/BANDAGES/DRESSINGS) ×3 IMPLANT
GLOVE BIO SURGEON STRL SZ8.5 (GLOVE) ×6 IMPLANT
GLOVE BIOGEL PI IND STRL 8.5 (GLOVE) ×1 IMPLANT
GLOVE BIOGEL PI INDICATOR 8.5 (GLOVE) ×2
GOWN SPEC L3 XXLG W/TWL (GOWN DISPOSABLE) ×3 IMPLANT
HANDPIECE INTERPULSE COAX TIP (DISPOSABLE) ×2
HOOD PEEL AWAY FLYTE STAYCOOL (MISCELLANEOUS) ×3 IMPLANT
LIQUID BAND (GAUZE/BANDAGES/DRESSINGS) ×6 IMPLANT
MANIFOLD NEPTUNE II (INSTRUMENTS) ×3 IMPLANT
MARKER SKIN DUAL TIP RULER LAB (MISCELLANEOUS) ×3 IMPLANT
NEEDLE SPNL 18GX3.5 QUINCKE PK (NEEDLE) ×3 IMPLANT
PACK ANTERIOR HIP CUSTOM (KITS) ×3 IMPLANT
SAW OSC TIP CART 19.5X105X1.3 (SAW) ×3 IMPLANT
SEALER BIPOLAR AQUA 6.0 (INSTRUMENTS) ×3 IMPLANT
SET HNDPC FAN SPRY TIP SCT (DISPOSABLE) ×1 IMPLANT
SOL PREP POV-IOD 4OZ 10% (MISCELLANEOUS) ×3 IMPLANT
SPONGE GAUZE 2X2 STER 10/PKG (GAUZE/BANDAGES/DRESSINGS)
SUT ETHIBOND NAB CT1 #1 30IN (SUTURE) ×6 IMPLANT
SUT MNCRL AB 3-0 PS2 18 (SUTURE) ×3 IMPLANT
SUT MON AB 2-0 CT1 36 (SUTURE) ×6 IMPLANT
SUT VIC AB 2-0 CT1 27 (SUTURE) ×2
SUT VIC AB 2-0 CT1 TAPERPNT 27 (SUTURE) ×1 IMPLANT
SUT VLOC 180 0 24IN GS25 (SUTURE) ×3 IMPLANT
SYR 50ML LL SCALE MARK (SYRINGE) IMPLANT

## 2016-06-20 NOTE — Progress Notes (Addendum)
[facility] PROGRESS NOTE  Nicole Blake ZOX:[id number] DOB: 03/15/1915 DOA: 06/19/2016   PCP:  Duane Lopeoss, Alan, MD     Assessment/Plan: Principal Problem:   Displaced fracture of left femoral neck (HCC) Active Problems:   Hypertension   Diabetes mellitus without complication (HCC)   Fall   Closed left hip fracture (HCC)   Skin tear of forearm without complication   Protein-calorie malnutrition, moderate (HCC)   Dementia   [age] y.o. female with medical history significant of HTN, diet-controled DM, dementia, who presents with left hip pain after fall. Patient has dementia, HPOA who is by the bedside denies any cardiac history.Cathlean Sauer. X-ray of left hip/pelvis showed displaced left femoral neck fracture. Patient is admitted to MedSurg bed. Ortho, Dr. Ranell PatrickNorris was consulted by EDP. She is a DO NOT RESUSCITATE   *Assessment and plan Displaced fracture of left femoral neck (HCC): As evidenced by x-ray. Patient has pain, but cannot characterize her pain in detail. No neurovascular compromise. Orthopedic surgeon, Dr. Ranell PatrickNorris was consulted. Pt is scheduled for surgery today. Patient has a significantly loud AS murmur, may have underlying aortic stenosis Discussed with POA -patient is a high-risk for intra-and perioperative complications. She is agreeable to proceed with surgery - Pain control: morphine prn and percocet - When necessary Zofran for nausea - Robaxin for muscle spasm - type and cross - INR/PTT - NPO after MN -IVF: NS 75 cc/h  Hypertension: Not on medications at home. Blood pressure is elevated at  210/99-->199/88, likely due to pain -IV hydralazine when necessary  Diet controlled diabetes: A1c 6.13/9/16. Not taking medications at home. Blood sugar 146 on admission. -Check blood sugar every morning -If sugar is elevated persistently, SSI  Skin tear of forearm without complication: -wound care consult  Protein-calorie malnutrition, moderate (HCC): -Consult to  nutrition  Dementia: no behavior disturbance -prn Ativan 0.5 mg q8h for agitation    DVT prophylaxsis  currently SCDs, defer pharmacologic DVT prophylaxis to orthopedics  Code Status:  DO NOT RESUSCITATE     Family Communication: Discussed in detail with the patient' POA Basnight,Sandra, all imaging results, lab results explained to the patient   Disposition Plan:  Will probably need SNF      [facility]  Procedures:  None  Antibiotics: Anti-infectives    Start     Dose/Rate Route Frequency Ordered Stop   06/20/16 1000  ceFAZolin (ANCEF) IVPB 2g/100 mL premix     2 g 200 mL/hr over 30 Minutes Intravenous On call to O.R. 06/20/16 0936 06/21/16 0559         HPI/Subjective: Patient is confused,  Objective: Vitals:   06/19/16 1845 06/19/16 2220 06/20/16 0430 06/20/16 0559  BP:  (!) 142/76 91/66   Pulse:  97 86   Resp:  17 20   Temp: 97.7 F (36.5 C) 98 F (36.7 C) 98.4 F (36.9 C)   TempSrc: Oral Axillary Oral   SpO2:  99% 100%   Weight:    54.4 kg (120 lb)  Height:    5\' 3"  (1.6 m)    Intake/Output Summary (Last 24 hours) at 06/20/16 0958 Last data filed at 06/20/16 0400  Gross per 24 hour  Intake           413.75 ml  Output                0 ml  Net           413.75 ml  Exam:  Examination:  General exam: Appears calm and comfortable  Respiratory system: Clear to auscultation. Respiratory effort normal. Cardiovascular system: S1 & S2 heard, RRR. No JVD, loud AS murmur, rubs, gallops or clicks. No pedal edema. Gastrointestinal system: Abdomen is nondistended, soft and nontender. No organomegaly or masses felt. Normal bowel sounds heard. Central nervous system: Confused. No focal neurological deficits. Extremities: Symmetric 5 x 5 power. Skin: No rashes, lesions or ulcers Psychiatry: Judgement and insight appear normal. Mood & affect appropriate.     Data Reviewed: I have personally reviewed following labs and  imaging studies  Micro Results No results found for this or any previous visit (from the past 240 hour(s)).  Radiology Reports Dg Chest 1 View  Result Date: 06/19/2016 CLINICAL DATA:  Larey Seat 3 weeks ago.  Right hip pain. EXAM: CHEST 1 VIEW COMPARISON:  12/19/2015 FINDINGS: The heart is normal in size. Stable marked tortuosity and ectasia of the thoracic aorta. The lungs are clear. No pleural effusion. Prominent skin fold noted over the left chest. The bony thorax is intact. No obvious rib fractures. IMPRESSION: No acute cardiopulmonary findings. Electronically Signed   By: Rudie Meyer M.D.   On: 06/19/2016 19:09   Dg Hip Unilat W Or Wo Pelvis 2-3 Views Left  Result Date: 06/19/2016 CLINICAL DATA:  Larey Seat 3 weeks ago.  Injured left hip. EXAM: DG HIP (WITH OR WITHOUT PELVIS) 2-3V LEFT COMPARISON:  None. FINDINGS: Difficult examination due to the position of the patient. There is a displaced left femoral neck fracture. The right hip is intact. The bony pelvis is grossly intact. IMPRESSION: Displaced left femoral neck fracture. Electronically Signed   By: Rudie Meyer M.D.   On: 06/19/2016 19:10     CBC  Recent Labs Lab 06/19/16 1830  WBC 10.5  HGB 13.0  HCT 40.1  PLT 282  MCV 88.7  MCH 28.8  MCHC 32.4  RDW 14.0  LYMPHSABS 1.8  MONOABS 0.8  EOSABS 0.2  BASOSABS 0.0    Chemistries   Recent Labs Lab 06/19/16 1727  NA 139  K 4.1  CL 104  CO2 25  GLUCOSE 146*  BUN 23*  CREATININE 1.02*  CALCIUM 9.4   ------------------------------------------------------------------------------------------------------------------ estimated creatinine clearance is 24.9 mL/min (by C-G formula based on SCr of 1.02 mg/dL (H)). ------------------------------------------------------------------------------------------------------------------ No results for input(s): HGBA1C in the last 72  hours. ------------------------------------------------------------------------------------------------------------------ No results for input(s): CHOL, HDL, LDLCALC, TRIG, CHOLHDL, LDLDIRECT in the last 72 hours. ------------------------------------------------------------------------------------------------------------------ No results for input(s): TSH, T4TOTAL, T3FREE, THYROIDAB in the last 72 hours.  Invalid input(s): FREET3 ------------------------------------------------------------------------------------------------------------------ No results for input(s): VITAMINB12, FOLATE, FERRITIN, TIBC, IRON, RETICCTPCT in the last 72 hours.  Coagulation profile  Recent Labs Lab 06/19/16 1727  INR 0.98    No results for input(s): DDIMER in the last 72 hours.  Cardiac Enzymes No results for input(s): CKMB, TROPONINI, MYOGLOBIN in the last 168 hours.  Invalid input(s): CK ------------------------------------------------------------------------------------------------------------------ Invalid input(s): POCBNP   CBG:  Recent Labs Lab 06/20/16 0841  GLUCAP 106*       Studies: Dg Chest 1 View  Result Date: 06/19/2016 CLINICAL DATA:  Larey Seat 3 weeks ago.  Right hip pain. EXAM: CHEST 1 VIEW COMPARISON:  12/19/2015 FINDINGS: The heart is normal in size. Stable marked tortuosity and ectasia of the thoracic aorta. The lungs are clear. No pleural effusion. Prominent skin fold noted over the left chest. The bony thorax is intact. No obvious rib fractures. IMPRESSION: No acute cardiopulmonary findings. Electronically Signed   By:  Rudie Meyer M.D.   On: 06/19/2016 19:09   Dg Hip Unilat W Or Wo Pelvis 2-3 Views Left  Result Date: 06/19/2016 CLINICAL DATA:  Larey Seat 3 weeks ago.  Injured left hip. EXAM: DG HIP (WITH OR WITHOUT PELVIS) 2-3V LEFT COMPARISON:  None. FINDINGS: Difficult examination due to the position of the patient. There is a displaced left femoral neck fracture. The right hip  is intact. The bony pelvis is grossly intact. IMPRESSION: Displaced left femoral neck fracture. Electronically Signed   By: Rudie Meyer M.D.   On: 06/19/2016 19:10      Lab Results  Component Value Date   HGBA1C 6.1 (H) 12/05/2014   Lab Results  Component Value Date   CREATININE 1.02 (H) 06/19/2016       Scheduled Meds: .  ceFAZolin (ANCEF) IV  2 g Intravenous On Call to OR  . chlorhexidine  60 mL Topical Once  . cholecalciferol  2,000 Units Oral Daily  . mupirocin ointment  1 application Nasal Daily   Continuous Infusions: . sodium chloride 75 mL/hr at 06/19/16 2229     LOS: 1 day    Time spent: >30 MINS    [facility]  [facility] Pager [phone]. If 7PM-7AM, please contact night-coverage at [email or web address], password [facility] 06/20/2016, 9:58 AM  LOS: 1 day

## 2016-06-20 NOTE — Anesthesia Preprocedure Evaluation (Signed)
Anesthesia Evaluation  Patient identified by MRN, date of birth, ID band Patient awake    Reviewed: Allergy & Precautions, NPO status , Unable to perform ROS - Chart review only  Airway Mallampati: II  TM Distance: >3 FB     Dental   Pulmonary pneumonia,    breath sounds clear to auscultation       Cardiovascular hypertension,  Rhythm:Regular Rate:Normal     Neuro/Psych    GI/Hepatic negative GI ROS, Neg liver ROS,   Endo/Other  diabetes  Renal/GU negative Renal ROS     Musculoskeletal   Abdominal   Peds  Hematology   Anesthesia Other Findings   Reproductive/Obstetrics                             Anesthesia Physical Anesthesia Plan  ASA: III  Anesthesia Plan: General   Post-op Pain Management:    Induction: Intravenous  Airway Management Planned: Oral ETT  Additional Equipment:   Intra-op Plan:   Post-operative Plan: Possible Post-op intubation/ventilation  Informed Consent: I have reviewed the patients History and Physical, chart, labs and discussed the procedure including the risks, benefits and alternatives for the proposed anesthesia with the patient or authorized representative who has indicated his/her understanding and acceptance.   Dental advisory given  Plan Discussed with: CRNA and Anesthesiologist  Anesthesia Plan Comments:         Anesthesia Quick Evaluation

## 2016-06-20 NOTE — Discharge Instructions (Signed)
°Dr. Merrik Puebla °Joint Replacement Specialist °[facility] °[address] °[place], [place] [address] °[phone] ° ° °TOTAL HIP REPLACEMENT POSTOPERATIVE DIRECTIONS ° ° ° °Hip Rehabilitation, Guidelines Following Surgery  ° °WEIGHT BEARING °Weight bearing as tolerated with assist device (walker, cane, etc) as directed, use it as long as suggested by your surgeon or therapist, typically at least 4-6 weeks. ° °The results of a hip operation are greatly improved after range of motion and muscle strengthening exercises. Follow all safety measures which are given to protect your hip. If any of these exercises cause increased pain or swelling in your joint, decrease the amount until you are comfortable again. Then slowly increase the exercises. Call your caregiver if you have problems or questions.  ° °HOME CARE INSTRUCTIONS  °Most of the following instructions are designed to prevent the dislocation of your new hip.  °Remove items at home which could result in a fall. This includes throw rugs or furniture in walking pathways.  °Continue medications as instructed at time of discharge. °· You may have some home medications which will be placed on hold until you complete the course of blood thinner medication. °· You may start showering once you are discharged home. Do not remove your dressing. °Do not put on socks or shoes without following the instructions of your caregivers.   °Sit on chairs with arms. Use the chair arms to help push yourself up when arising.  °Arrange for the use of a toilet seat elevator so you are not sitting low.  °· Walk with walker as instructed.  °You may resume a sexual relationship in one month or when given the OK by your caregiver.  °Use walker as long as suggested by your caregivers.  °You may put full weight on your legs and walk as much as is comfortable. °Avoid periods of inactivity such as sitting longer than an hour when not asleep. This helps prevent  blood clots.  °You may return to work once you are cleared by your surgeon.  °Do not drive a car for 6 weeks or until released by your surgeon.  °Do not drive while taking narcotics.  °Wear elastic stockings for two weeks following surgery during the day but you may remove then at night.  °Make sure you keep all of your appointments after your operation with all of your doctors and caregivers. You should call the office at the above phone number and make an appointment for approximately two weeks after the date of your surgery. °Please pick up a stool softener and laxative for home use as long as you are requiring pain medications. °· ICE to the affected hip every three hours for 30 minutes at a time and then as needed for pain and swelling. Continue to use ice on the hip for pain and swelling from surgery. You may notice swelling that will progress down to the foot and ankle.  This is normal after surgery.  Elevate the leg when you are not up walking on it.   °It is important for you to complete the blood thinner medication as prescribed by your doctor. °· Continue to use the breathing machine which will help keep your temperature down.  It is common for your temperature to cycle up and down following surgery, especially at night when you are not up moving around and exerting yourself.  The breathing machine keeps your lungs expanded and your temperature down. ° °RANGE OF MOTION AND STRENGTHENING EXERCISES  °These exercises are   designed to help you keep full movement of your hip joint. Follow your caregiver's or physical therapist's instructions. Perform all exercises about fifteen times, three times per day or as directed. Exercise both hips, even if you have had only one joint replacement. These exercises can be done on a training (exercise) mat, on the floor, on a table or on a bed. Use whatever works the best and is most comfortable for you. Use music or television while you are exercising so that the exercises  are a pleasant break in your day. This will make your life better with the exercises acting as a break in routine you can look forward to.  °Lying on your back, slowly slide your foot toward your buttocks, raising your knee up off the floor. Then slowly slide your foot back down until your leg is straight again.  °Lying on your back spread your legs as far apart as you can without causing discomfort.  °Lying on your side, raise your upper leg and foot straight up from the floor as far as is comfortable. Slowly lower the leg and repeat.  °Lying on your back, tighten up the muscle in the front of your thigh (quadriceps muscles). You can do this by keeping your leg straight and trying to raise your heel off the floor. This helps strengthen the largest muscle supporting your knee.  °Lying on your back, tighten up the muscles of your buttocks both with the legs straight and with the knee bent at a comfortable angle while keeping your heel on the floor.  ° °SKILLED REHAB INSTRUCTIONS: °If the patient is transferred to a skilled rehab facility following release from the hospital, a list of the current medications will be sent to the facility for the patient to continue.  When discharged from the skilled rehab facility, please have the facility set up the patient's Home Health Physical Therapy prior to being released. Also, the skilled facility will be responsible for providing the patient with their medications at time of release from the facility to include their pain medication and their blood thinner medication. If the patient is still at the rehab facility at time of the two week follow up appointment, the skilled rehab facility will also need to assist the patient in arranging follow up appointment in our office and any transportation needs. ° °MAKE SURE YOU:  °Understand these instructions.  °Will watch your condition.  °Will get help right away if you are not doing well or get worse. ° °Pick up stool softner and  laxative for home use following surgery while on pain medications. °Do not remove your dressing. °The dressing is waterproof--it is OK to take showers. °Continue to use ice for pain and swelling after surgery. °Do not use any lotions or creams on the incision until instructed by your surgeon. °Total Hip Protocol. ° ° °

## 2016-06-20 NOTE — H&P (View-Only) (Signed)
  Nicole Blake is an 82 y.o. female.    Chief Complaint: left hip/knee pain  HPI: 82 y/o female sustained ground level fall 3 weeks ago [place]. [place] brought [place] to [place] to help take care of her due to inability/difficulty trying to bear weight. Pt had been evaluated for 'knee pain'. Seen today with more accurate diagnosis of left femoral neck fracture with referred pain to her knee. Plan to admit and surgery tomorrow to restore function and reduce pain.  PCP:   Duane Lopeoss, Alan, MD  PMH: Past Medical History:  Diagnosis Date  . Diabetes mellitus without complication (HCC)   . Hypertension     PSH: History reviewed. No pertinent surgical history.  Social History:  reports that she has never smoked. She has never used smokeless tobacco. She reports that she does not drink alcohol. Her drug history is not on file.  Allergies:  Allergies  Allergen Reactions  . Doxycycline Other (See Comments)    Gi- Upset  . Robitussin (Alcohol Free) [Guaifenesin] Other (See Comments)    Stomach upset  . Septra [Sulfamethoxazole-Trimethoprim] Other (See Comments)    Gi- upset  . Sulfa Antibiotics     pts niece believes she may not be able to take this    Medications: Current Facility-Administered Medications  Medication Dose Route Frequency Provider Last Rate Last Dose  . 0.9 %  sodium chloride infusion   Intravenous Continuous Benjiman CoreNathan Pickering, MD      . fentaNYL (SUBLIMAZE) injection 50 mcg  50 mcg Intravenous Q30 min PRN Benjiman CoreNathan Pickering, MD       Current Outpatient Prescriptions  Medication Sig Dispense Refill  . acetaminophen (TYLENOL) 325 MG tablet Take 650 mg by mouth every 6 (six) hours as needed for moderate pain.    . Cholecalciferol (VITAMIN D) 2000 units CAPS Take 2,000 Units by mouth daily.    . mupirocin ointment (BACTROBAN) 2 % Place 1 application into the nose daily.      No results found for this or any previous visit (from the past 48 hour(s)). No results  found.  ROS: ROS Usually walks with minimal assistance Symptoms for 3 weeks  Physical Exam: Alert but disoriented 82 y/o female in no acute distress Cervical spine with full rom, no tenderness Bilateral upper extremities with full rom, small superficial skin tear left dorsal forearm nv intact distally Bilateral lower extremities with flexion contractures at the knee nv intact distally Physical Exam   Assessment/Plan Assessment: left femoral neck fracture  Plan: Plan to be admitted by hospitalist and surgery scheduled for tomorrow morning NPO Pain management as needed

## 2016-06-20 NOTE — Progress Notes (Signed)
Chaplain paged to pray for Nicole Blake prior to surgery. She is 82 years old and has a broken hip. Niece provided chaplain with anointing oils to apply after the prayer.   Page the chaplain should Nicole Blake or her family need or request further spiritual care.  Benjie Karvonenharles D. Mackinzie Vuncannon, DMin, MDiv, MA Chaplain

## 2016-06-20 NOTE — Anesthesia Procedure Notes (Signed)
Procedure Name: Intubation Date/Time: 06/20/2016 1:13 PM Performed by: Doran ClayALDAY, Patrcia Schnepp R Pre-anesthesia Checklist: Patient identified, Emergency Drugs available, Suction available, Patient being monitored and Timeout performed Patient Re-evaluated:Patient Re-evaluated prior to inductionOxygen Delivery Method: Circle system utilized Preoxygenation: Pre-oxygenation with 100% oxygen Intubation Type: IV induction Laryngoscope Size: Mac and 3 Grade View: Grade I Tube type: Oral Tube size: 7.0 mm Number of attempts: 1 Airway Equipment and Method: Stylet Placement Confirmation: ETT inserted through vocal cords under direct vision,  positive ETCO2 and breath sounds checked- equal and bilateral Secured at: 22 cm Tube secured with: Tape Dental Injury: Teeth and Oropharynx as per pre-operative assessment

## 2016-06-20 NOTE — Progress Notes (Signed)
Pt approx arrival time to floor 2130. Pt assessed and vital signs taken. Pt was soiled with stool and urine in incontinent brief, Also skin tear on LAFA noted...  ED Nurse Judeth CornfieldStephanie transported to floor. Questioned ED Nurse Judeth CornfieldStephanie if patient had order for a foley and if the Pt was a full code or DNR. ED nurse Judeth CornfieldStephanie stated" the foley is supposed to be put in tomorrow before surgery and pt is a full code."   Upon checking orders, Noted pt was a DNR and stat order for foley and UA was placed at 1527.   I called ED Nurse Judeth CornfieldStephanie for clarification of the above mentioned orders. She then stated her charge Nurse Johnny BridgeMartha told her not to put the foley in. ED charge nurse spoke with Charge Nurse Reita ClicheBobby..I was not aware of this conversation.  Foley was placed and UA labs sent, Skin tears cleansed and covered with appropriate dressing. Follow up given to day shift nurse. No OR orders noted at 06:00 06/20/16.

## 2016-06-20 NOTE — Op Note (Signed)
OPERATIVE REPORT  SURGEON: Samson Frederic, MD   ASSISTANT: Alphonsa Overall, PA-C.  PREOPERATIVE DIAGNOSIS: Chronic displaced Left femoral neck fracture.   POSTOPERATIVE DIAGNOSIS: Chronic displaced Left femoral neck fracture.   PROCEDURE: Left hip hemiarthroplasty, anterior approach.   IMPLANTS: DePuy Tri Lock stem, size 5, std offset, with a -3 mm spacer and a 46 mm monopolar head ball.  ANESTHESIA:  General  ANTIBIOTICS: 2g ancef.  ESTIMATED BLOOD LOSS: 200 mL.    DRAINS: None.  COMPLICATIONS: None.  SPECIMENS: Left femoral head and neck remnant for permanent pathology.   CONDITION: PACU - hemodynamically stable.   BRIEF CLINICAL NOTE: Nicole Blake is a 82 y.o. female with a chronic, displaced Left femoral neck fracture. The patient was admitted to the hospitalist service and underwent perioperative risk stratification and medical optimization. The risks, benefits, and alternatives to hemiarthroplasty were explained, and the patient elected to proceed.  PROCEDURE IN DETAIL: The patient was taken to the operating room and general anesthesia was induced on the hospital bed. The patient was then positioned on the Hana table. All bony prominences were well padded. The hip was prepped and draped in the normal sterile surgical fashion. A time-out was called verifying side and site of surgery. Antibiotics were given within 60 minutes of beginning the procedure.  The direct anterior approach to the hip was performed through the Hueter interval. Lateral femoral circumflex vessels were treated with the Auqumantys. The anterior capsule was exposed and an inverted T capsulotomy was made. Fracture hematoma was encountered and evacuated. The patient was found to have a comminuted Left subcapital femoral neck fracture. I freshened the femoral neck cut with a saw. I removed the femoral neck fragment. A corkscrew was placed into the head and the head was removed. This was passed to  the back table and was measured.  Acetabular exposure was achieved. I examined the articular cartilage which was intact. The labrum was intact. A 46 mm trial head was placed and found to have excellent fit.  I then gained femoral exposure taking care to protect the abductors and greater trochanter. This was performed using standard external rotation, extension, and adduction. The capsule was peeled off the inner aspect of the greater trochanter, taking care to preserve the short external rotators. A cookie cutter was used to enter the femoral canal, and then the femoral canal finder was used to confirm location. I then sequentially broached up to a size 5. Calcar planer was used on the femoral neck remnant. I paced a std neck and a 36+ 1.5 head ball.The hip was reduced. Leg lengths were checked fluoroscopically. The hip was dislocated and trial components were removed. I placed the real stem followed by the real spacer and head ball. A single reduction maneuver was performed and the hip was reduced. Fluoroscopy was used to confirm component position and leg lengths. At 90 degrees of external rotation and extension, the hip was stable to an anterior directed force.  The wound was copiously irrigated with normal saline solution. Marcaine solution was injected into the periarticular soft tissue. The wound was closed in layers using #1 Vicryl and V-Loc for the fascia, 2-0 Vicryl for the subcutaneous fat, 2-0 Monocryl for the deep dermal layer, 3-0 running Monocryl subcuticular stitch and glue for the skin. Once the glue was fully dried, an Aquacell Ag dressing was applied. The patient was then awakened from anesthesia and transported to the recovery room in stable condition. Sponge, needle, and instrument counts were correct at  the end of the case x2. The patient tolerated the procedure well and there were no known complications.  Please note that a surgical assistant was a medical  necessity for this procedure to perform it in a safe and expeditious manner. Assistant was necessary to provide appropriate retraction of vital neurovascular structures, to prevent femoral fracture, and to allow for anatomic placement of the prosthesis.

## 2016-06-20 NOTE — Transfer of Care (Signed)
Immediate Anesthesia Transfer of Care Note  Patient: Nicole Blake  Procedure(s) Performed: Procedure(s): ANTERIOR APPROACH HEMI HIP ARTHROPLASTY (Left)  Patient Location: PACU  Anesthesia Type:General  Level of Consciousness: sedated  Airway & Oxygen Therapy: Patient Spontanous Breathing and Patient connected to face mask oxygen  Post-op Assessment: Report given to RN and Post -op Vital signs reviewed and stable  Post vital signs: Reviewed and stable  Last Vitals:  Vitals:   06/20/16 0430 06/20/16 1131  BP: 91/66 (!) 141/50  Pulse: 86 73  Resp: 20 18  Temp: 36.9 C 37 C    Last Pain:  Vitals:   06/20/16 1131  TempSrc: Oral  PainSc:          Complications: No apparent anesthesia complications

## 2016-06-20 NOTE — Progress Notes (Signed)
PT Cancellation Note  Patient Details Name: Nicole FeeMartha J Blake MRN: [id number] DOB: 07/10/1915   Cancelled Treatment:    Reason Eval/Treat Not Completed: Medical issues which prohibited therapy (pt scheduled for surgery today, will see tomorrow. )   Tamala SerUhlenberg, Kadience Macchi Kistler 06/20/2016, 6:49 AM [phone]

## 2016-06-20 NOTE — Interval H&P Note (Signed)
History and Physical Interval Note:  06/20/2016 12:02 PM  Nicole Blake  has presented today for surgery, with the diagnosis of left hip fractuer  The various methods of treatment have been discussed with the patient and family. After consideration of risks, benefits and other options for treatment, the patient has consented to  Procedure(s): ANTERIOR APPROACH HEMI HIP ARTHROPLASTY (Left) as a surgical intervention .  The patient's history has been reviewed, patient examined, no change in status, stable for surgery.  I have reviewed the patient's chart and labs.  Questions were answered to the patient's satisfaction.    The risks, benefits, and alternatives were discussed with the patient. There are risks associated with the surgery including, but not limited to, problems with anesthesia (death), infection, instability (giving out of the joint), dislocation, differences in leg length/angulation/rotation, fracture of bones, loosening or failure of implants, hematoma (blood accumulation) which may require surgical drainage, blood clots, pulmonary embolism, nerve injury (foot drop and lateral thigh numbness), and blood vessel injury. The patient understands these risks and elects to proceed.    Love Chowning, Cloyde ReamsBrian James

## 2016-06-20 NOTE — Anesthesia Postprocedure Evaluation (Signed)
Anesthesia Post Note  Patient: Nicole Blake  Procedure(s) Performed: Procedure(s) (LRB): ANTERIOR APPROACH HEMI HIP ARTHROPLASTY (Left)  Patient location during evaluation: PACU Anesthesia Type: General Pain management: pain level controlled Respiratory status: spontaneous breathing Cardiovascular status: stable Anesthetic complications: no    Last Vitals:  Vitals:   06/20/16 1515 06/20/16 1530  BP: (!) 156/66 (!) 168/83  Pulse: 69 77  Resp: 14 18  Temp:  36.8 C    Last Pain:  Vitals:   06/20/16 1530  TempSrc:   PainSc: 0-No pain                 EDWARDS,Cruze Zingaro

## 2016-06-21 LAB — CBC AND DIFFERENTIAL
HCT: 32 % — AB (ref 36–46)
Hemoglobin: 10.5 g/dL — AB (ref 12.0–16.0)
Platelets: 221 10*3/uL (ref 150–399)
WBC: 10.5 10*3/mL

## 2016-06-21 LAB — BASIC METABOLIC PANEL
BUN: 26 mg/dL — AB (ref 4–21)
CREATININE: 1.2 mg/dL — AB (ref 0.5–1.1)
GLUCOSE: 123 mg/dL
POTASSIUM: 4.4 mmol/L (ref 3.4–5.3)
SODIUM: 138 mmol/L (ref 137–147)

## 2016-06-21 LAB — CBC
HEMATOCRIT: 31.8 % — AB (ref 36.0–46.0)
Hemoglobin: 10.5 g/dL — ABNORMAL LOW (ref 12.0–15.0)
MCH: 28.8 pg (ref 26.0–34.0)
MCHC: 33 g/dL (ref 30.0–36.0)
MCV: 87.4 fL (ref 78.0–100.0)
Platelets: 221 10*3/uL (ref 150–400)
RBC: 3.64 MIL/uL — ABNORMAL LOW (ref 3.87–5.11)
RDW: 13.8 % (ref 11.5–15.5)
WBC: 10.5 10*3/uL (ref 4.0–10.5)

## 2016-06-21 LAB — GLUCOSE, CAPILLARY
GLUCOSE-CAPILLARY: 96 mg/dL (ref 65–99)
GLUCOSE-CAPILLARY: 116 mg/dL — AB (ref 65–99)
GLUCOSE-CAPILLARY: 128 mg/dL — AB (ref 65–99)
GLUCOSE-CAPILLARY: 161 mg/dL — AB (ref 65–99)
GLUCOSE-CAPILLARY: 133 mg/dL — AB (ref 65–99)

## 2016-06-21 LAB — COMPREHENSIVE METABOLIC PANEL
ALBUMIN: 2.8 g/dL — AB (ref 3.5–5.0)
ALT: 11 U/L — ABNORMAL LOW (ref 14–54)
ANION GAP: 8 (ref 5–15)
AST: 18 U/L (ref 15–41)
Alkaline Phosphatase: 68 U/L (ref 38–126)
BILIRUBIN TOTAL: 0.3 mg/dL (ref 0.3–1.2)
BUN: 26 mg/dL — AB (ref 6–20)
CHLORIDE: 107 mmol/L (ref 101–111)
CO2: 23 mmol/L (ref 22–32)
Calcium: 8.3 mg/dL — ABNORMAL LOW (ref 8.9–10.3)
Creatinine, Ser: 1.15 mg/dL — ABNORMAL HIGH (ref 0.44–1.00)
GFR calc Af Amer: 44 mL/min — ABNORMAL LOW (ref >60)
GFR calc non Af Amer: 38 mL/min — ABNORMAL LOW (ref >60)
GLUCOSE: 123 mg/dL — AB (ref 65–99)
POTASSIUM: 4.4 mmol/L (ref 3.5–5.1)
SODIUM: 138 mmol/L (ref 135–145)
TOTAL PROTEIN: 5.7 g/dL — AB (ref 6.5–8.1)

## 2016-06-21 LAB — URINE CULTURE: Culture: <10000 — AB

## 2016-06-21 LAB — HEMOGLOBIN A1C
Hgb A1c MFr Bld: 6.2 % — ABNORMAL HIGH (ref 4.8–5.6)
MEAN PLASMA GLUCOSE: 131 mg/dL

## 2016-06-21 LAB — HEPATIC FUNCTION PANEL: Bilirubin, Total: 0.3 mg/dL

## 2016-06-21 MED ORDER — ENSURE ENLIVE PO LIQD: 237.0000 mL | Freq: Two times a day (BID) | ORAL | Status: DC | PRN

## 2016-06-21 NOTE — Care Management Note (Signed)
Case Management Note  Patient Details  Name: Nicole Blake MRN: [id number] Date of Birth: 08/10/1915  Subjective/Objective:                  L femoral neck fx, S/P L hip hemi 06/20/16  Action/Plan: CM spoke with caregiver [name]. She asked questions about Medicare coverage for SNF for rehab and long-term placement. CM answered her questions. She informed CSW today of the facility she prefers. Discussed having back-up options available for the CSW in the event [facility] does not have a bed when the patient is discharged. She has another facility in mind but does not have the name with her. She plans to take the patient back home after she is discharged from SNF. She states she looks forward to talking with the CSW tomorrow.   Expected Discharge Date:   (unknown)               Expected Discharge Plan:  Skilled Nursing Facility  In-House Referral:  Clinical Social Work  Discharge planning Services  CM Consult  Post Acute Care Choice:    Choice offered to:     DME Arranged:    DME Agency:     HH Arranged:    HH Agency:     Status of Service:  Completed, signed off  If discussed at [employer] Meetings, dates discussed:    Additional Comments:  Antony HasteBennett, Brynnley Dayrit Harris, RN 06/21/2016, 3:54 PM

## 2016-06-21 NOTE — Progress Notes (Signed)
Pt's urine output has been low. Paged Dr. Benjamine MolaVann and was told to encourage oral intake. Will continue to monitor patient.

## 2016-06-21 NOTE — Evaluation (Signed)
Physical Therapy Evaluation Patient Details Name: Nicole Blake MRN: [id number] DOB: [birth year] Today's Date: 06/21/2016   History of Present Illness  82 yo female admitted with L femoral neck fx. S/P L hip hemi 06/20/16. hx of dementia, htn, dm  Clinical Impression  On eval, pt required Max assist for mobility. She was able to take a few pivotal steps with a RW. Pt remains at high risk for falls. She will impulsively attempt to sit before safely to do so.  Family present during session. She reports pt will need to be ambulatory in order for her to be able to care for pt at home. At this time, recommendations is for ST rehab at [facility].     Follow Up Recommendations SNF    Equipment Recommendations  Rolling walker with 5" wheels    Recommendations for Other Services       Precautions / Restrictions Precautions Precautions: Fall Restrictions Weight Bearing Restrictions: No LLE Weight Bearing: Weight bearing as tolerated      Mobility  Bed Mobility Overal bed mobility: Needs Assistance Bed Mobility: Supine to Sit     Supine to sit: Max assist;HOB elevated     General bed mobility comments: Assist for bil LEs and trunk. Utilized bedpad for scooting, positioning.   Transfers Overall transfer level: Needs assistance Equipment used: Rolling walker (2 wheeled) Transfers: Sit to/from [employer] Sit to Stand: Max assist Stand pivot transfers: Max assist       General transfer comment: Assist to rise, stabilize, control descent. Pt took a couple of pivotal steps with RW towards recliner before she impulsively tried to sit down requiring increased assistance to prevent fall.   Ambulation/Gait             General Gait Details: NT  Stairs            Wheelchair Mobility    Modified Rankin (Stroke Patients Only)       Balance Overall balance assessment: Needs assistance;History of Falls           Standing balance-Leahy Scale: Poor                                Pertinent Vitals/Pain Pain Assessment: Faces Faces Pain Scale: Hurts even more Pain Location: L LE with activity Pain Descriptors / Indicators: Aching;Sore Pain Intervention(s): Limited activity within patient's tolerance;Repositioned    Home Living Family/patient expects to be discharged to:: Unsure Living Arrangements: Other relatives                    Prior Function Level of Independence: Needs assistance   Gait / Transfers Assistance Needed: uses rollator for ambulation.   ADL's / Homemaking Assistance Needed: Per family, able to feed herself and wash her face.         Hand Dominance        Extremity/Trunk Assessment   Upper Extremity Assessment: Defer to OT evaluation           Lower Extremity Assessment: Generalized weakness;Difficult to assess due to impaired cognition      Cervical / Trunk Assessment: Kyphotic  Communication      Cognition Arousal/Alertness: Awake/alert Behavior During Therapy: WFL for tasks assessed/performed (will impulsively sit) Overall Cognitive Status: History of cognitive impairments - at baseline                      General Comments  Exercises     Assessment/Plan    PT Assessment Patient needs continued PT services  PT Problem List Decreased strength;Decreased mobility;Decreased balance;Decreased activity tolerance;Decreased cognition;Decreased safety awareness;Pain;Decreased knowledge of use of DME          PT Treatment Interventions DME instruction;Gait training;Functional mobility training;Therapeutic activities;Therapeutic exercise;Balance training;Patient/family education    PT Goals (Current goals can be found in the Care Plan section)  Acute Rehab PT Goals Patient Stated Goal: per family-pt will be able to ambulate in order to return home PT Goal Formulation: With family Time For Goal Achievement: 07/05/16 Potential to Achieve Goals: Good     Frequency 7X/week   Barriers to discharge        Co-evaluation               End of Session Equipment Utilized During Treatment: Gait belt Activity Tolerance: Patient limited by pain;Patient limited by fatigue Patient left: in chair;with call bell/phone within reach;with family/visitor present           Time: [id number] PT Time Calculation (min) (ACUTE ONLY): 21 min   Charges:   PT Evaluation $PT Eval Low Complexity: 1 Procedure     PT G Codes:        Rebeca AlertJannie Keymiah Lyles, MPT Pager: [phone]

## 2016-06-21 NOTE — Progress Notes (Signed)
Chaplain referred to room by staff. Ms Nicole Blake reports that she appreciates all of the great care she has received and for the prayers of so many. She is anxious to begin her rehab after her hip replacement. She remains determined to do all she can to "get back on her feet, if possible." Prayer of thanksgiving for coming through her surgery was rendered.  Benjie Karvonenharles D. Danna Sewell, DMin Chaplain

## 2016-06-21 NOTE — Progress Notes (Signed)
Nutrition Brief Note  RD consulted via Hip Fracture Protocol. Pt is 82 y.o., eating 80-100% of meals at this time. Will order Ensure supplements PRN.  S/p ANTERIOR APPROACH HEMI HIP ARTHROPLASTY (Left).  Wt Readings from Last 15 Encounters:  06/20/16 120 lb (54.4 kg)  12/08/14 141 lb 8.6 oz (64.2 kg)    Body mass index is 21.26 kg/m. Patient meets criteria for normal range based on current BMI.   Current diet order is regular, patient is consuming approximately 80-100% of meals at this time. Labs and medications reviewed.   No nutrition interventions warranted at this time. If nutrition issues arise, please consult RD.   Tilda FrancoLindsey Kenzlie Disch, MS, RD, LDN Pager: [phone] After Hours Pager: [phone]

## 2016-06-21 NOTE — Progress Notes (Signed)
[facility] PROGRESS NOTE  Nicole Blake:[id number] DOB: 09-08-1915 DOA: 06/19/2016   PCP:  Duane Lope, MD     Assessment/Plan: Principal Problem:   Displaced fracture of left femoral neck (HCC) Active Problems:   Hypertension   Diabetes mellitus without complication (HCC)   Fall   Closed left hip fracture (HCC)   Skin tear of forearm without complication   Protein-calorie malnutrition, moderate (HCC)   Dementia   Left displaced femoral neck fracture (HCC)   82 y.o. female with medical history significant of HTN, diet-controled DM, dementia, who presents with left hip pain after fall. Patient has dementia, HPOA who is by the bedside denies any cardiac history.Cathlean Sauer of left hip/pelvis showed displaced left femoral neck fracture. Patient is admitted to MedSurg bed. Ortho, Dr. Ranell Patrick was consulted by EDP. She is a DO NOT RESUSCITATE    Displaced fracture of left femoral neck (HCC): As evidenced by x-ray. -s/p repain on 9/23 by Dr. Veda Canning - When necessary Zofran for nausea - Robaxin for muscle spasm  Hypertension: Not on medications at home. Blood pressure is elevated at  210/99-->199/88, likely due to pain -IV hydralazine when necessary  Diet controlled diabetes: A1c 6.13/9/16. Not taking medications at home. Blood sugar 146 on admission. -Check blood sugar every morning -If sugar is elevated persistently, SSI  Skin tear of forearm without complication: -wound care consult  Protein-calorie malnutrition, moderate (HCC): -Consult to nutrition  Dementia: no behavior disturbance -prn Ativan 0.5 mg q8h for agitation    DVT prophylaxsis  currently SCDs, defer pharmacologic DVT prophylaxis to orthopedics  Code Status:  DO NOT RESUSCITATE     Family Communication: discussed with daughter   Disposition Plan: SNF      Consultants:  [facility]  Procedures:  None  Antibiotics: Anti-infectives    Start     Dose/Rate Route  Frequency Ordered Stop   06/21/16 0000  ceFAZolin (ANCEF) IVPB 2g/100 mL premix     2 g 200 mL/hr over 30 Minutes Intravenous  Once 06/20/16 1745 06/21/16 0001   06/20/16 1915  ceFAZolin (ANCEF) IVPB 2g/100 mL premix  Status:  Discontinued     2 g 200 mL/hr over 30 Minutes Intravenous Every 6 hours 06/20/16 1632 06/20/16 1745   06/20/16 1130  ceFAZolin (ANCEF) IVPB 2g/100 mL premix     2 g 200 mL/hr over 30 Minutes Intravenous On call to O.R. 06/20/16 1035 06/20/16 1314   06/20/16 1000  ceFAZolin (ANCEF) IVPB 2g/100 mL premix  Status:  Discontinued     2 g 200 mL/hr over 30 Minutes Intravenous On call to O.R. 06/20/16 0936 06/20/16 1053         HPI/Subjective: Pleasantly confused  Objective: Vitals:   06/20/16 2201 06/21/16 0142 06/21/16 0605 06/21/16 1005  BP: (!) 165/72 (!) 142/69 121/90 (!) 111/53  Pulse: 74 81 92 75  Resp: 12 12 12 14   Temp: 98.4 F (36.9 C) 97.8 F (36.6 C) 98.3 F (36.8 C) 97.9 F (36.6 C)  TempSrc: Axillary Oral Oral Axillary  SpO2: 100% 98% 99% 98%  Weight:      Height:        Intake/Output Summary (Last 24 hours) at 06/21/16 1306 Last data filed at 06/21/16 1022  Gross per 24 hour  Intake             3435 ml  Output              750 ml  Net             2685 ml    Exam:  Examination:  General exam: Appears calm and comfortable- chronically ill appearing  Respiratory system: Clear to auscultation. Respiratory effort normal. Cardiovascular system: S1 & S2 heard, RRR. No JVD, loud AS murmur, rubs, gallops or clicks. No pedal edema. Gastrointestinal system: Abdomen is nondistended, soft and nontender. No organomegaly or masses felt. Normal bowel sounds heard. Central nervous system: Confused. No focal neurological deficits. .     Data Reviewed: I have personally reviewed following labs and imaging studies  Micro Results Recent Results (from the past 240 hour(s))  Urine culture     Status: Abnormal   Collection Time: 06/20/16  5:00  AM  Result Value Ref Range Status   Specimen Description URINE, CATHETERIZED  Final   Special Requests NONE  Final   Culture (A)  Final    <10,000 COLONIES/mL INSIGNIFICANT GROWTH Performed at [facility]    Report Status 06/21/2016 FINAL  Final  MRSA PCR Screening     Status: None   Collection Time: 06/20/16  5:47 AM  Result Value Ref Range Status   MRSA by PCR NEGATIVE NEGATIVE Final    Comment:        The GeneXpert MRSA Assay (FDA approved for NASAL specimens only), is one component of a comprehensive MRSA colonization surveillance program. It is not intended to diagnose MRSA infection nor to guide or monitor treatment for MRSA infections.     Radiology Reports Dg Chest 1 View  Result Date: 06/19/2016 CLINICAL DATA:  Larey Seat 3 weeks ago.  Right hip pain. EXAM: CHEST 1 VIEW COMPARISON:  12/19/2015 FINDINGS: The heart is normal in size. Stable marked tortuosity and ectasia of the thoracic aorta. The lungs are clear. No pleural effusion. Prominent skin fold noted over the left chest. The bony thorax is intact. No obvious rib fractures. IMPRESSION: No acute cardiopulmonary findings. Electronically Signed   By: Rudie Meyer M.D.   On: 06/19/2016 19:09   Pelvis Portable  Result Date: 06/20/2016 CLINICAL DATA:  Left hip replacement. EXAM: PORTABLE PELVIS 1-2 VIEWS COMPARISON:  Earlier today.  Pelvis CT dated 12/05/2014. FINDINGS: Left hip bipolar prosthesis in satisfactory position and alignment. No fracture dislocation seen. Diffuse osteopenia. Lower thoracic spine degenerative changes. Uterine fibroid calcifications in the right pelvis. IMPRESSION: Satisfactory postoperative appearance of a left hip prosthesis. Electronically Signed   By: Beckie Salts M.D.   On: 06/20/2016 15:17   Dg C-arm 1-60 Min-no Report  Result Date: 06/20/2016 CLINICAL DATA: surgery C-ARM 1-60 MINUTES Fluoroscopy was utilized by the requesting physician.  No radiographic interpretation.   Dg Hip  Operative Unilat W Or W/o Pelvis Left  Result Date: 06/20/2016 CLINICAL DATA:  Hemiarthroplasty EXAM: OPERATIVE LEFT HIP (WITH PELVIS IF PERFORMED) 3 VIEWS TECHNIQUE: Fluoroscopic spot image(s) were submitted for interpretation post-operatively. COMPARISON:  Preop 06/19/2016 FINDINGS: Findings document interval left hip arthroplasty, components project in expected location without fracture or dislocation. IMPRESSION: Left hip arthroplasty Electronically Signed   By: Corlis Leak M.D.   On: 06/20/2016 14:53   Dg Hip Unilat W Or Wo Pelvis 2-3 Views Left  Result Date: 06/19/2016 CLINICAL DATA:  Larey Seat 3 weeks ago.  Injured left hip. EXAM: DG HIP (WITH OR WITHOUT PELVIS) 2-3V LEFT COMPARISON:  None. FINDINGS: Difficult examination due to the position of the patient. There is a displaced left femoral neck fracture. The right hip is intact. The bony pelvis is grossly intact. IMPRESSION:  Displaced left femoral neck fracture. Electronically Signed   By: Rudie MeyerP.  Gallerani M.D.   On: 06/19/2016 19:10     CBC  Recent Labs Lab 06/19/16 1830 06/21/16 0418  WBC 10.5 10.5  HGB 13.0 10.5*  HCT 40.1 31.8*  PLT 282 221  MCV 88.7 87.4  MCH 28.8 28.8  MCHC 32.4 33.0  RDW 14.0 13.8  LYMPHSABS 1.8  --   MONOABS 0.8  --   EOSABS 0.2  --   BASOSABS 0.0  --     Chemistries   Recent Labs Lab 06/19/16 1727 06/21/16 0418  NA 139 138  K 4.1 4.4  CL 104 107  CO2 25 23  GLUCOSE 146* 123*  BUN 23* 26*  CREATININE 1.02* 1.15*  CALCIUM 9.4 8.3*  AST  --  18  ALT  --  11*  ALKPHOS  --  68  BILITOT  --  0.3   ------------------------------------------------------------------------------------------------------------------ estimated creatinine clearance is 22.1 mL/min (by C-G formula based on SCr of 1.15 mg/dL (H)). ------------------------------------------------------------------------------------------------------------------  Recent Labs  06/19/16 1830  HGBA1C 6.2*    ------------------------------------------------------------------------------------------------------------------ No results for input(s): CHOL, HDL, LDLCALC, TRIG, CHOLHDL, LDLDIRECT in the last 72 hours. ------------------------------------------------------------------------------------------------------------------ No results for input(s): TSH, T4TOTAL, T3FREE, THYROIDAB in the last 72 hours.  Invalid input(s): FREET3 ------------------------------------------------------------------------------------------------------------------ No results for input(s): VITAMINB12, FOLATE, FERRITIN, TIBC, IRON, RETICCTPCT in the last 72 hours.  Coagulation profile  Recent Labs Lab 06/19/16 1727  INR 0.98    No results for input(s): DDIMER in the last 72 hours.  Cardiac Enzymes No results for input(s): CKMB, TROPONINI, MYOGLOBIN in the last 168 hours.  Invalid input(s): CK ------------------------------------------------------------------------------------------------------------------ Invalid input(s): POCBNP   CBG:  Recent Labs Lab 06/20/16 1706 06/20/16 2024 06/21/16 0759 06/21/16 1150 06/21/16 1153  GLUCAP 157* 149* 96 128* 116*       Studies: Dg Chest 1 View  Result Date: 06/19/2016 CLINICAL DATA:  Larey SeatFell 3 weeks ago.  Right hip pain. EXAM: CHEST 1 VIEW COMPARISON:  12/19/2015 FINDINGS: The heart is normal in size. Stable marked tortuosity and ectasia of the thoracic aorta. The lungs are clear. No pleural effusion. Prominent skin fold noted over the left chest. The bony thorax is intact. No obvious rib fractures. IMPRESSION: No acute cardiopulmonary findings. Electronically Signed   By: Rudie MeyerP.  Gallerani M.D.   On: 06/19/2016 19:09   Pelvis Portable  Result Date: 06/20/2016 CLINICAL DATA:  Left hip replacement. EXAM: PORTABLE PELVIS 1-2 VIEWS COMPARISON:  Earlier today.  Pelvis CT dated 12/05/2014. FINDINGS: Left hip bipolar prosthesis in satisfactory position and alignment.  No fracture dislocation seen. Diffuse osteopenia. Lower thoracic spine degenerative changes. Uterine fibroid calcifications in the right pelvis. IMPRESSION: Satisfactory postoperative appearance of a left hip prosthesis. Electronically Signed   By: Beckie SaltsSteven  Reid M.D.   On: 06/20/2016 15:17   Dg C-arm 1-60 Min-no Report  Result Date: 06/20/2016 CLINICAL DATA: surgery C-ARM 1-60 MINUTES Fluoroscopy was utilized by the requesting physician.  No radiographic interpretation.   Dg Hip Operative Unilat W Or W/o Pelvis Left  Result Date: 06/20/2016 CLINICAL DATA:  Hemiarthroplasty EXAM: OPERATIVE LEFT HIP (WITH PELVIS IF PERFORMED) 3 VIEWS TECHNIQUE: Fluoroscopic spot image(s) were submitted for interpretation post-operatively. COMPARISON:  Preop 06/19/2016 FINDINGS: Findings document interval left hip arthroplasty, components project in expected location without fracture or dislocation. IMPRESSION: Left hip arthroplasty Electronically Signed   By: Corlis Leak  Hassell M.D.   On: 06/20/2016 14:53   Dg Hip Unilat W Or Wo Pelvis 2-3 Views Left  Result Date: 06/19/2016 CLINICAL DATA:  Larey Seat 3 weeks ago.  Injured left hip. EXAM: DG HIP (WITH OR WITHOUT PELVIS) 2-3V LEFT COMPARISON:  None. FINDINGS: Difficult examination due to the position of the patient. There is a displaced left femoral neck fracture. The right hip is intact. The bony pelvis is grossly intact. IMPRESSION: Displaced left femoral neck fracture. Electronically Signed   By: Rudie Meyer M.D.   On: 06/19/2016 19:10      Lab Results  Component Value Date   HGBA1C 6.2 (H) 06/19/2016   HGBA1C 6.1 (H) 12/05/2014   Lab Results  Component Value Date   CREATININE 1.15 (H) 06/21/2016       Scheduled Meds: . aspirin EC  81 mg Oral BID WC  . cholecalciferol  2,000 Units Oral Daily  . insulin aspart  0-9 Units Subcutaneous TID WC  . mupirocin ointment  1 application Nasal Daily   Continuous Infusions: . sodium chloride 75 mL/hr at 06/21/16 0607      LOS: 2 days    Time spent: 25 MINS    Monia Timmers U [facility]  [facility] Pager [phone]. If 7PM-7AM, please contact night-coverage at [email or web address], password [facility] 06/21/2016, 1:06 PM  LOS: 2 days

## 2016-06-21 NOTE — Clinical Social Work Note (Signed)
Clinical Social Work Assessment  Patient Details  Name: Nicole Blake MRN: [id number] Date of Birth: 06/16/1915  Date of referral:  06/21/16               Reason for consult:  Facility Placement                Permission sought to share information with:  Facility Industrial/product designerContact Representative Permission granted to share information::  No (pt disoriented)  Name::     Data processing managerandra  Agency::  SNF  Relationship::  niece/HCPOA  Contact Information:     Housing/Transportation Living arrangements for the past 2 months:  Single Family Home Source of Information:  Power of Attorney Patient Interpreter Needed:  None Criminal Activity/Legal Involvement Pertinent to Current Situation/Hospitalization:  No - Comment as needed Significant Relationships:  Other Family Members Lives with:  Relatives Do you feel safe going back to the place where you live?  No Need for family participation in patient care:  Yes (Comment) (decision making, ADLs)  Care giving concerns: pt lives at home with niece, [name], and Sandra's husband- [name] reports that it is important patient is capable of walking before returning home.   Social Worker assessment / plan:  CSW spoke with pt Clement SayresHCPOA, Sandra, concerning PT recommendation for SNF.  [name] acknowledges that patient is requiring much higher level of care than normal after hip fracture and is concerned about taking patient home with current impairment/pain issues.  CSW explained SNF and SNF search process- [name] reports she has already looked into options and [facility].  Employment status:  Retired Health and safety inspectornsurance information:  Medicare PT Recommendations:  Skilled Nursing Facility Information / Referral to community resources:  Skilled Nursing Facility  Patient/Family's Response to care:  [name] is agreeable to SNF for pt and very proactive in the search for SNF.  Patient/Family's Understanding of and Emotional Response to Diagnosis, Current Treatment, and Prognosis:   Family very hopeful for patients recovery but acknowledge risks associated with pt age.  Emotional Assessment Appearance:  Appears stated age Attitude/Demeanor/Rapport:    Affect (typically observed):  Appropriate Orientation:  Oriented to Self Alcohol / Substance use:  Not Applicable Psych involvement (Current and /or in the community):  No (Comment)  Discharge Needs  Concerns to be addressed:  Care Coordination Readmission within the last 30 days:  No Current discharge risk:  Physical Impairment Barriers to Discharge:  Continued Medical Work up   Burna SisUris, Ivyonna Hoelzel H, LCSW 06/21/2016, 11:34 AM

## 2016-06-21 NOTE — Progress Notes (Signed)
Subjective: 1 Day Post-Op Procedure(s) (LRB): ANTERIOR APPROACH HEMI HIP ARTHROPLASTY (Left) Patient reports pain as 3 on 0-10 scale.    Objective: Vital signs in last 24 hours: Temp:  [97.8 F (36.6 C)-98.6 F (37 C)] 98.3 F (36.8 C) (09/24 0605) Pulse Rate:  [66-92] 92 (09/24 0605) Resp:  [10-18] 12 (09/24 0605) BP: (121-168)/(50-96) 121/90 (09/24 0605) SpO2:  [95 %-100 %] 99 % (09/24 0605)  Intake/Output from previous day: 09/23 0701 - 09/24 0700 In: 3045 [P.O.:120; I.V.:2925] Out: 1075 [Urine:875; Blood:200] Intake/Output this shift: Total I/O In: 120 [P.O.:120] Out: -    Recent Labs  06/19/16 1830 06/21/16 0418  HGB 13.0 10.5*    Recent Labs  06/19/16 1830 06/21/16 0418  WBC 10.5 10.5  RBC 4.52 3.64*  HCT 40.1 31.8*  PLT 282 221    Recent Labs  06/19/16 1727 06/21/16 0418  NA 139 138  K 4.1 4.4  CL 104 107  CO2 25 23  BUN 23* 26*  CREATININE 1.02* 1.15*  GLUCOSE 146* 123*  CALCIUM 9.4 8.3*    Recent Labs  06/19/16 1727  INR 0.98    No cellulitis present  Assessment/Plan: 1 Day Post-Op Procedure(s) (LRB): ANTERIOR APPROACH HEMI HIP ARTHROPLASTY (Left) Up with therapy  Pharaoh Pio A 06/21/2016, 8:49 AM

## 2016-06-21 NOTE — Progress Notes (Signed)
Nicole Blake, NT stuck patient for CBG at 1150. What was drawn up into the glucose strip as the blood sample was clear. The meter still read the sample as 128. The nurse tech restuck the patient and blood was drawn at this time. The meter resulted with 116 for the CBG and that one was used for the 1200 CBG check.

## 2016-06-21 NOTE — Progress Notes (Addendum)
Physical Therapy Treatment Patient Details Name: Nicole Blake MRN: [id number] DOB: 02/08/1915 Today's Date: 06/21/2016    History of Present Illness 82 yo female admitted with L femoral neck fx. S/P L hip hemi 06/20/16. hx of dementia, htn, dm    PT Comments    Pt was able to walk ~10 feet this session. Distance was limited by pain. Family  Requested pt remain in recliner at end of session. Continues to require +2 assistance for safety. Recommend ST rehab at SNF.   Follow Up Recommendations  SNF     Equipment Recommendations  Rolling walker with 5" wheels    Recommendations for Other Services       Precautions / Restrictions Precautions Precautions: Fall Restrictions Weight Bearing Restrictions: No LLE Weight Bearing: Weight bearing as tolerated    Mobility  Bed Mobility               General bed mobility comments: oob in recliner  Transfers Overall transfer level: Needs assistance Equipment used: Rolling walker (2 wheeled) Transfers: Sit to/from Stand Sit to Stand: Max assist         General transfer comment: x2. Assist to rise, stabilize, control descent. Multimodal cues for safety, technique, hand placement. Pt continues to attempt to sit before safe to do so.  Ambulation/Gait Ambulation/Gait assistance: Mod assist;+2 safety/equipment Ambulation Distance (Feet): 10 Feet Assistive device: Rolling walker (2 wheeled) Gait Pattern/deviations: Trunk flexed;Step-to pattern     General Gait Details: Assist to stabilize pt and maneuver with RW. Distance limited by pain. Attempted a 2nd walk but pt was unable to tolerate it. Followed closely with a recliner.    Stairs            Wheelchair Mobility    Modified Rankin (Stroke Patients Only)       Balance           Standing balance support: Bilateral upper extremity supported Standing balance-Leahy Scale: Poor                      Cognition Arousal/Alertness: Awake/alert Behavior  During Therapy: WFL for tasks assessed/performed (will impulsively sit) Overall Cognitive Status: History of cognitive impairments - at baseline                      Exercises      General Comments        Pertinent Vitals/Pain Pain Assessment: Faces Faces Pain Scale: Hurts even more Pain Location: L LE with activity Pain Descriptors / Indicators: Aching;Sore Pain Intervention(s): Limited activity within patient's tolerance    Home Living Family/patient expects to be discharged to:: Unsure Living Arrangements: Other relatives Available Help at Discharge: Family Type of Home: House Home Access: Stairs to enter Entrance Stairs-Rails: Right Home Layout: Two level Home Equipment: Environmental consultantWalker - 4 wheels      Prior Function            PT Goals (current goals can now be found in the care plan section) Progress towards PT goals: Progressing toward goals (slowly)    Frequency    7X/week      PT Plan Current plan remains appropriate    Co-evaluation             End of Session Equipment Utilized During Treatment: Gait belt Activity Tolerance: Patient limited by pain Patient left: in chair;with call bell/phone within reach;with family/visitor present     Time: [id number] PT Time Calculation (min) (ACUTE ONLY): 17 min  Charges:  $Gait Training: 8-22 mins                    G Codes:      Weston Anna, MPT Pager: [phone]

## 2016-06-21 NOTE — NC FL2 (Signed)
[place] MEDICAID FL2 LEVEL OF CARE SCREENING TOOL     IDENTIFICATION  Patient Name: Nicole Blake Birthdate: 11/17/1914 Sex: female Admission Date (Current Location): 06/19/2016  [facility] [place]   Facility and Address:  [facility],  [address], [place] [id number]      Provider Number: [id number]  Attending Physician Name and Address:  Joseph ArtJessica U Vann, DO  Relative Name and Phone Number:       Current Level of Care: Hospital Recommended Level of Care: Skilled Nursing Facility Prior Approval Number:    Date Approved/Denied:   PASRR Number: [id number]  Discharge Plan: SNF    Current Diagnoses: Patient Active Problem List   Diagnosis Date Noted  . Left displaced femoral neck fracture (HCC) 06/20/2016  . Fall 06/19/2016  . Displaced fracture of left femoral neck (HCC) 06/19/2016  . Closed left hip fracture (HCC) 06/19/2016  . Skin tear of forearm without complication 06/19/2016  . Protein-calorie malnutrition, moderate (HCC) 06/19/2016  . Dementia 06/19/2016  . Hypertension   . Diabetes mellitus without complication (HCC)   . Essential hypertension   . Aspiration pneumonia (HCC)   . Nausea and vomiting 12/05/2014  . Emesis 12/05/2014  . CAP (community acquired pneumonia) 12/05/2014    Orientation RESPIRATION BLADDER Height & Weight     Self  Normal Incontinent, Indwelling catheter Weight: 120 lb (54.4 kg) Height:  5\' 3"  (160 cm)  BEHAVIORAL SYMPTOMS/MOOD NEUROLOGICAL BOWEL NUTRITION STATUS      Incontinent Diet (see DC summary)  AMBULATORY STATUS COMMUNICATION OF NEEDS Skin   Extensive Assist Verbally Surgical wounds                       Personal Care Assistance Level of Assistance  Bathing, Dressing Bathing Assistance: Maximum assistance   Dressing Assistance: Maximum assistance     Functional Limitations Info             SPECIAL CARE FACTORS FREQUENCY  PT (By licensed PT), OT (By licensed OT)      PT Frequency: 5/wk OT Frequency: 5/wk            Contractures      Additional Factors Info  Code Status, Allergies, Insulin Sliding Scale Code Status Info: DNR Allergies Info: Doxycycline, Robitussin (Alcohol Free) Guaifenesin, Septra Sulfamethoxazole-trimethoprim, Sulfa Antibiotics   Insulin Sliding Scale Info: 3/day       Current Medications (06/21/2016):  This is the current hospital active medication list Current Facility-Administered Medications  Medication Dose Route Frequency Provider Last Rate Last Dose  . 0.9 %  sodium chloride infusion   Intravenous Continuous Lorretta HarpXilin Niu, MD 75 mL/hr at 06/21/16 [phone]    . acetaminophen (TYLENOL) tablet 650 mg  650 mg Oral Q6H PRN Samson FredericBrian Swinteck, MD   650 mg at 06/21/16 [phone]   Or  . acetaminophen (TYLENOL) suppository 650 mg  650 mg Rectal Q6H PRN Samson FredericBrian Swinteck, MD      . aspirin EC tablet 81 mg  81 mg Oral BID WC Samson FredericBrian Swinteck, MD   81 mg at 06/21/16 [id number]  . bisacodyl (DULCOLAX) EC tablet 5 mg  5 mg Oral Daily PRN Lorretta HarpXilin Niu, MD      . cholecalciferol (VITAMIN D) tablet 2,000 Units  2,000 Units Oral Daily Lorretta HarpXilin Niu, MD   2,000 Units at 06/21/16 [phone]  . hydrALAZINE (APRESOLINE) injection 5 mg  5 mg Intravenous Q2H PRN Lorretta HarpXilin Niu, MD      . HYDROcodone-acetaminophen (  NORCO/VICODIN) 5-325 MG per tablet 1-2 tablet  1-2 tablet Oral Q6H PRN Samson Frederic, MD      . insulin aspart (novoLOG) injection 0-9 Units  0-9 Units Subcutaneous TID WC Richarda Overlie, MD   2 Units at 06/20/16 1717  . LORazepam (ATIVAN) injection 0.5 mg  0.5 mg Intravenous Q8H PRN Lorretta Harp, MD      . menthol-cetylpyridinium (CEPACOL) lozenge 3 mg  1 lozenge Oral PRN Samson Frederic, MD       Or  . phenol (CHLORASEPTIC) mouth spray 1 spray  1 spray Mouth/Throat PRN Samson Frederic, MD      . metoCLOPramide (REGLAN) tablet 5-10 mg  5-10 mg Oral Q8H PRN Samson Frederic, MD       Or  . metoCLOPramide (REGLAN) injection 5-10 mg  5-10 mg Intravenous Q8H PRN Samson Frederic, MD       . morphine 2 MG/ML injection 0.5 mg  0.5 mg Intravenous Q3H PRN Lorretta Harp, MD   0.5 mg at 06/20/16 0521  . mupirocin ointment (BACTROBAN) 2 % 1 application  1 application Nasal Daily Lorretta Harp, MD   1 application at 06/21/16 [phone]  . ondansetron (ZOFRAN) tablet 4 mg  4 mg Oral Q6H PRN Samson Frederic, MD       Or  . ondansetron (ZOFRAN) injection 4 mg  4 mg Intravenous Q6H PRN Samson Frederic, MD      . senna-docusate (Senokot-S) tablet 1 tablet  1 tablet Oral QHS PRN Lorretta Harp, MD         Discharge Medications: Please see discharge summary for a list of discharge medications.  Relevant Imaging Results:  Relevant Lab Results:   Additional Information SS#: [id number]  Burna Sis, LCSW

## 2016-06-22 ENCOUNTER — Encounter (HOSPITAL_COMMUNITY): Payer: Self-pay | Admitting: Orthopedic Surgery

## 2016-06-22 DIAGNOSIS — S72002A Fracture of unspecified part of neck of left femur, initial encounter for closed fracture: Secondary | ICD-10-CM | POA: Diagnosis not present

## 2016-06-22 DIAGNOSIS — S0990XA Unspecified injury of head, initial encounter: Secondary | ICD-10-CM | POA: Diagnosis not present

## 2016-06-22 DIAGNOSIS — Z9181 History of falling: Secondary | ICD-10-CM | POA: Diagnosis not present

## 2016-06-22 DIAGNOSIS — G309 Alzheimer's disease, unspecified: Secondary | ICD-10-CM | POA: Diagnosis not present

## 2016-06-22 DIAGNOSIS — R1311 Dysphagia, oral phase: Secondary | ICD-10-CM | POA: Diagnosis not present

## 2016-06-22 DIAGNOSIS — F0280 Dementia in other diseases classified elsewhere without behavioral disturbance: Secondary | ICD-10-CM | POA: Diagnosis not present

## 2016-06-22 DIAGNOSIS — M6281 Muscle weakness (generalized): Secondary | ICD-10-CM | POA: Diagnosis not present

## 2016-06-22 DIAGNOSIS — Z7189 Other specified counseling: Secondary | ICD-10-CM | POA: Diagnosis not present

## 2016-06-22 DIAGNOSIS — M79675 Pain in left toe(s): Secondary | ICD-10-CM | POA: Diagnosis not present

## 2016-06-22 DIAGNOSIS — B351 Tinea unguium: Secondary | ICD-10-CM | POA: Diagnosis not present

## 2016-06-22 DIAGNOSIS — Y999 Unspecified external cause status: Secondary | ICD-10-CM | POA: Diagnosis not present

## 2016-06-22 DIAGNOSIS — Z7982 Long term (current) use of aspirin: Secondary | ICD-10-CM | POA: Diagnosis not present

## 2016-06-22 DIAGNOSIS — S51812D Laceration without foreign body of left forearm, subsequent encounter: Secondary | ICD-10-CM | POA: Diagnosis not present

## 2016-06-22 DIAGNOSIS — S0181XA Laceration without foreign body of other part of head, initial encounter: Secondary | ICD-10-CM | POA: Diagnosis not present

## 2016-06-22 DIAGNOSIS — S0191XA Laceration without foreign body of unspecified part of head, initial encounter: Secondary | ICD-10-CM | POA: Diagnosis not present

## 2016-06-22 DIAGNOSIS — S72092A Other fracture of head and neck of left femur, initial encounter for closed fracture: Secondary | ICD-10-CM | POA: Diagnosis not present

## 2016-06-22 DIAGNOSIS — E119 Type 2 diabetes mellitus without complications: Secondary | ICD-10-CM | POA: Diagnosis not present

## 2016-06-22 DIAGNOSIS — S51802D Unspecified open wound of left forearm, subsequent encounter: Secondary | ICD-10-CM | POA: Diagnosis not present

## 2016-06-22 DIAGNOSIS — Y939 Activity, unspecified: Secondary | ICD-10-CM | POA: Diagnosis not present

## 2016-06-22 DIAGNOSIS — D62 Acute posthemorrhagic anemia: Secondary | ICD-10-CM | POA: Diagnosis not present

## 2016-06-22 DIAGNOSIS — M79674 Pain in right toe(s): Secondary | ICD-10-CM | POA: Diagnosis not present

## 2016-06-22 DIAGNOSIS — E440 Moderate protein-calorie malnutrition: Secondary | ICD-10-CM | POA: Diagnosis not present

## 2016-06-22 DIAGNOSIS — S01511A Laceration without foreign body of lip, initial encounter: Secondary | ICD-10-CM | POA: Diagnosis not present

## 2016-06-22 DIAGNOSIS — S72002D Fracture of unspecified part of neck of left femur, subsequent encounter for closed fracture with routine healing: Secondary | ICD-10-CM | POA: Diagnosis not present

## 2016-06-22 DIAGNOSIS — Z96642 Presence of left artificial hip joint: Secondary | ICD-10-CM | POA: Diagnosis not present

## 2016-06-22 DIAGNOSIS — W06XXXA Fall from bed, initial encounter: Secondary | ICD-10-CM | POA: Diagnosis not present

## 2016-06-22 DIAGNOSIS — Y929 Unspecified place or not applicable: Secondary | ICD-10-CM | POA: Diagnosis not present

## 2016-06-22 DIAGNOSIS — S098XXA Other specified injuries of head, initial encounter: Secondary | ICD-10-CM | POA: Diagnosis not present

## 2016-06-22 DIAGNOSIS — R600 Localized edema: Secondary | ICD-10-CM | POA: Diagnosis not present

## 2016-06-22 DIAGNOSIS — I10 Essential (primary) hypertension: Secondary | ICD-10-CM | POA: Diagnosis not present

## 2016-06-22 DIAGNOSIS — R262 Difficulty in walking, not elsewhere classified: Secondary | ICD-10-CM | POA: Diagnosis not present

## 2016-06-22 LAB — CBC
HEMATOCRIT: 30.6 % — AB (ref 36.0–46.0)
HEMOGLOBIN: 9.9 g/dL — AB (ref 12.0–15.0)
MCH: 28.9 pg (ref 26.0–34.0)
MCHC: 32.4 g/dL (ref 30.0–36.0)
MCV: 89.2 fL (ref 78.0–100.0)
Platelets: 209 10*3/uL (ref 150–400)
RBC: 3.43 MIL/uL — AB (ref 3.87–5.11)
RDW: 14.2 % (ref 11.5–15.5)
WBC: 9.1 10*3/uL (ref 4.0–10.5)

## 2016-06-22 LAB — BASIC METABOLIC PANEL
ANION GAP: 5 (ref 5–15)
BUN: 24 mg/dL — AB (ref 4–21)
BUN: 24 mg/dL — ABNORMAL HIGH (ref 6–20)
CHLORIDE: 113 mmol/L — AB (ref 101–111)
CO2: 23 mmol/L (ref 22–32)
Calcium: 8.2 mg/dL — ABNORMAL LOW (ref 8.9–10.3)
Creatinine, Ser: 1.01 mg/dL — ABNORMAL HIGH (ref 0.44–1.00)
Creatinine: 1 mg/dL (ref 0.5–1.1)
GFR calc non Af Amer: 44 mL/min — ABNORMAL LOW (ref >60)
GFR, EST AFRICAN AMERICAN: 52 mL/min — AB (ref >60)
GLUCOSE: 92 mg/dL
Glucose, Bld: 92 mg/dL (ref 65–99)
Potassium: 3.8 mmol/L (ref 3.5–5.1)
SODIUM: 141 mmol/L (ref 137–147)
Sodium: 141 mmol/L (ref 135–145)

## 2016-06-22 LAB — CBC AND DIFFERENTIAL: WBC: 9.1 10^3/mL

## 2016-06-22 LAB — GLUCOSE, CAPILLARY
GLUCOSE-CAPILLARY: 84 mg/dL (ref 65–99)
Glucose-Capillary: 155 mg/dL — ABNORMAL HIGH (ref 65–99)

## 2016-06-22 MED ORDER — MUPIROCIN 2 % EX OINT
TOPICAL_OINTMENT | Freq: Every day | CUTANEOUS | Status: DC
  Administered 2016-06-22: 1 via TOPICAL

## 2016-06-22 MED ORDER — GLUCERNA SHAKE PO LIQD
237.0000 mL | Freq: Three times a day (TID) | ORAL | Status: DC
  Filled 2016-06-22: qty 237

## 2016-06-22 MED ORDER — BISACODYL 5 MG PO TBEC: 5.0000 mg | DELAYED_RELEASE_TABLET | Freq: Every day | ORAL | 0 refills | Status: DC | PRN

## 2016-06-22 MED ORDER — ASPIRIN 81 MG PO TBEC: 81.0000 mg | DELAYED_RELEASE_TABLET | Freq: Two times a day (BID) | ORAL | Status: DC

## 2016-06-22 MED ORDER — ENSURE ENLIVE PO LIQD: 237.0000 mL | Freq: Two times a day (BID) | ORAL | 12 refills | Status: DC | PRN

## 2016-06-22 MED ORDER — GLUCERNA SHAKE PO LIQD: 237.0000 mL | Freq: Three times a day (TID) | ORAL | 0 refills | Status: DC

## 2016-06-22 MED ORDER — MUPIROCIN 2 % EX OINT: 1.0000 "application " | TOPICAL_OINTMENT | Freq: Every day | CUTANEOUS | 0 refills | Status: DC

## 2016-06-22 NOTE — Progress Notes (Signed)
   Subjective:  Patient reports pain as mild to moderate.  Ambulated 10 feet with PT yesterday.  Objective:   VITALS:   Vitals:   06/21/16 1005 06/21/16 1300 06/21/16 2149 06/22/16 0440  BP: (!) 111/53 (!) 147/57 130/86 (!) 153/53  Pulse: 75 77 80 81  Resp: 14 14 14 16   Temp: 97.9 F (36.6 C) 98.2 F (36.8 C) 98.5 F (36.9 C) 97.8 F (36.6 C)  TempSrc: Axillary Axillary Axillary Oral  SpO2: 98% 98% 98% 98%  Weight:      Height:        NAD ABD soft Sensation intact distally Intact pulses distally Dorsiflexion/Plantar flexion intact Incision: dressing C/D/I Compartment soft   Lab Results  Component Value Date   WBC 9.1 06/22/2016   HGB 9.9 (L) 06/22/2016   HCT 30.6 (L) 06/22/2016   MCV 89.2 06/22/2016   PLT 209 06/22/2016   BMET    Component Value Date/Time   NA 141 06/22/2016 0425   K 3.8 06/22/2016 0425   CL 113 (H) 06/22/2016 0425   CO2 23 06/22/2016 0425   GLUCOSE 92 06/22/2016 0425   BUN 24 (H) 06/22/2016 0425   CREATININE 1.01 (H) 06/22/2016 0425   CALCIUM 8.2 (L) 06/22/2016 0425   GFRNONAA 44 (L) 06/22/2016 0425   GFRAA 52 (L) 06/22/2016 0425     Assessment/Plan: 2 Days Post-Op   Principal Problem:   Displaced fracture of left femoral neck (HCC) Active Problems:   Hypertension   Diabetes mellitus without complication (HCC)   Fall   Closed left hip fracture (HCC)   Skin tear of forearm without complication   Protein-calorie malnutrition, moderate (HCC)   Dementia   Left displaced femoral neck fracture (HCC)   WBAT with walker DVT ppx: ASA 81 mg PO BID x6 weeks, SCDs, TEDs PT/OT PO pain control Dispo: d/c planning, please discuss d/c plan with Chong SicilianJill Lauer, RN case manager [facility]   Ornella Coderre, Cloyde ReamsBrian James 06/22/2016, 7:29 AM   Samson FredericBrian Phuc Kluttz, MD Cell [phone]

## 2016-06-22 NOTE — Discharge Summary (Addendum)
Physician Discharge Summary  Nicole FeeMartha J Wrightsman ZOX:[id number] DOB: 09/26/1915 DOA: 06/19/2016  PCP:  Duane Lopeoss, Alan, MD  Admit date: 06/19/2016 Discharge date: 06/22/2016   Recommendations for Outpatient Follow-Up:   1. SNF   Discharge Diagnosis:   Principal Problem:   Displaced fracture of left femoral neck (HCC) Active Problems:   Hypertension   Diabetes mellitus without complication (HCC)   Fall   Closed left hip fracture (HCC)   Skin tear of forearm without complication   Protein-calorie malnutrition, moderate (HCC)   Dementia   Left displaced femoral neck fracture [facility](HCC)   Discharge disposition:  SNF:  Discharge Condition: Improved.  Diet recommendation:.  Carb mod Wound care: None.   History of Present Illness:   Nicole Blake is a 82 y.o. female with medical history significant of HTN, diet-controled DM, dementia, who presents with left hip pain after fall.   Patient has dementia, and does not answer questions appropriately, and is unable to provide accurate medical history, therefore, most of the history is obtained by discussing the case with ED physician and with the nursing staff. The history is very limited.  It seems that pt had fall 3 weeks ago. Had a negative knee [facility] through [facility]. Pt was initially diagnosed with a knee ligament injury, but the patient has been unable to put weight on her left leg due to pain since her injury. [facility] and was diagnosed with a left hip fracture. Pt has surgery scheduled tomorrow morning.    Hospital Course by Problem:   Displaced fracture of left femoral neck (HCC): As evidenced by x-ray. -s/p repair on 9/23 by Dr. Veda CanningSwintek - When necessary Zofran for nausea - Robaxin for muscle spasm  Hypertension:Not on medications at home. Blood pressure is elevated at 210/99-->199/88, likely due to pain Once pain resolved, may need PO BP meds  Diet controlled diabetes: A1c 6.13/9/16. Not taking  medications at home.   Skin tear of forearm without complication: -wound care consult  Protein-calorie malnutrition, moderate (HCC): -Consult to nutrition  Dementia:nobehavior disturbance -prn Ativan 0.5 mg q8h for agitation     Medical Consultants:    ortho   Discharge Exam:   Vitals:   06/22/16 0440 06/22/16 1424  BP: (!) 153/53 (!) 160/59  Pulse: 81 70  Resp: 16 16  Temp: 97.8 F (36.6 C) 98 F (36.7 C)   Vitals:   06/21/16 1300 06/21/16 2149 06/22/16 0440 06/22/16 1424  BP: (!) 147/57 130/86 (!) 153/53 (!) 160/59  Pulse: 77 80 81 70  Resp: 14 14 16 16   Temp: 98.2 F (36.8 C) 98.5 F (36.9 C) 97.8 F (36.6 C) 98 F (36.7 C)  TempSrc: Axillary Axillary Oral Oral  SpO2: 98% 98% 98% 98%  Weight:      Height:        Gen:  NAD   The results of significant diagnostics from this hospitalization (including imaging, microbiology, ancillary and laboratory) are listed below for reference.     Procedures and Diagnostic Studies:   Dg Chest 1 View  Result Date: 06/19/2016 CLINICAL DATA:  Larey SeatFell 3 weeks ago.  Right hip pain. EXAM: CHEST 1 VIEW COMPARISON:  12/19/2015 FINDINGS: The heart is normal in size. Stable marked tortuosity and ectasia of the thoracic aorta. The lungs are clear. No pleural effusion. Prominent skin fold noted over the left chest. The bony thorax is intact. No obvious rib fractures. IMPRESSION: No acute cardiopulmonary findings. Electronically Signed   By:  Rudie Meyer M.D.   On: 06/19/2016 19:09   Pelvis Portable  Result Date: 06/20/2016 CLINICAL DATA:  Left hip replacement. EXAM: PORTABLE PELVIS 1-2 VIEWS COMPARISON:  Earlier today.  Pelvis CT dated 12/05/2014. FINDINGS: Left hip bipolar prosthesis in satisfactory position and alignment. No fracture dislocation seen. Diffuse osteopenia. Lower thoracic spine degenerative changes. Uterine fibroid calcifications in the right pelvis. IMPRESSION: Satisfactory postoperative appearance of a left  hip prosthesis. Electronically Signed   By: Beckie Salts M.D.   On: 06/20/2016 15:17   Dg C-arm 1-60 Min-no Report  Result Date: 06/20/2016 CLINICAL DATA: surgery C-ARM 1-60 MINUTES Fluoroscopy was utilized by the requesting physician.  No radiographic interpretation.   Dg Hip Operative Unilat W Or W/o Pelvis Left  Result Date: 06/20/2016 CLINICAL DATA:  Hemiarthroplasty EXAM: OPERATIVE LEFT HIP (WITH PELVIS IF PERFORMED) 3 VIEWS TECHNIQUE: Fluoroscopic spot image(s) were submitted for interpretation post-operatively. COMPARISON:  Preop 06/19/2016 FINDINGS: Findings document interval left hip arthroplasty, components project in expected location without fracture or dislocation. IMPRESSION: Left hip arthroplasty Electronically Signed   By: Corlis Leak M.D.   On: 06/20/2016 14:53   Dg Hip Unilat W Or Wo Pelvis 2-3 Views Left  Result Date: 06/19/2016 CLINICAL DATA:  Larey Seat 3 weeks ago.  Injured left hip. EXAM: DG HIP (WITH OR WITHOUT PELVIS) 2-3V LEFT COMPARISON:  None. FINDINGS: Difficult examination due to the position of the patient. There is a displaced left femoral neck fracture. The right hip is intact. The bony pelvis is grossly intact. IMPRESSION: Displaced left femoral neck fracture. Electronically Signed   By: Rudie Meyer M.D.   On: 06/19/2016 19:10     Labs:   Basic Metabolic Panel:  Recent Labs Lab 06/19/16 1727 06/21/16 0418 06/22/16 0425  NA 139 138 141  K 4.1 4.4 3.8  CL 104 107 113*  CO2 25 23 23   GLUCOSE 146* 123* 92  BUN 23* 26* 24*  CREATININE 1.02* 1.15* 1.01*  CALCIUM 9.4 8.3* 8.2*   GFR Estimated Creatinine Clearance: 25.1 mL/min (by C-G formula based on SCr of 1.01 mg/dL (H)). Liver Function Tests:  Recent Labs Lab 06/21/16 0418  AST 18  ALT 11*  ALKPHOS 68  BILITOT 0.3  PROT 5.7*  ALBUMIN 2.8*   No results for input(s): LIPASE, AMYLASE in the last 168 hours. No results for input(s): AMMONIA in the last 168 hours. Coagulation profile  Recent  Labs Lab 06/19/16 1727  INR 0.98    CBC:  Recent Labs Lab 06/19/16 1830 06/21/16 0418 06/22/16 0425  WBC 10.5 10.5 9.1  NEUTROABS 7.8*  --   --   HGB 13.0 10.5* 9.9*  HCT 40.1 31.8* 30.6*  MCV 88.7 87.4 89.2  PLT 282 221 209   Cardiac Enzymes: No results for input(s): CKTOTAL, CKMB, CKMBINDEX, TROPONINI in the last 168 hours. BNP: Invalid input(s): POCBNP CBG:  Recent Labs Lab 06/21/16 1153 06/21/16 1742 06/21/16 2143 06/22/16 0758 06/22/16 1229  GLUCAP 116* 161* 133* 84 155*   D-Dimer No results for input(s): DDIMER in the last 72 hours. Hgb A1c  Recent Labs  06/19/16 1830  HGBA1C 6.2*   Lipid Profile No results for input(s): CHOL, HDL, LDLCALC, TRIG, CHOLHDL, LDLDIRECT in the last 72 hours. Thyroid function studies No results for input(s): TSH, T4TOTAL, T3FREE, THYROIDAB in the last 72 hours.  Invalid input(s): FREET3 Anemia work up No results for input(s): VITAMINB12, FOLATE, FERRITIN, TIBC, IRON, RETICCTPCT in the last 72 hours. Microbiology Recent Results (from the past 240  hour(s))  Urine culture     Status: Abnormal   Collection Time: 06/20/16  5:00 AM  Result Value Ref Range Status   Specimen Description URINE, CATHETERIZED  Final   Special Requests NONE  Final   Culture (A)  Final    <10,000 COLONIES/mL INSIGNIFICANT GROWTH Performed at [facility]    Report Status 06/21/2016 FINAL  Final  MRSA PCR Screening     Status: None   Collection Time: 06/20/16  5:47 AM  Result Value Ref Range Status   MRSA by PCR NEGATIVE NEGATIVE Final    Comment:        The GeneXpert MRSA Assay (FDA approved for NASAL specimens only), is one component of a comprehensive MRSA colonization surveillance program. It is not intended to diagnose MRSA infection nor to guide or monitor treatment for MRSA infections.      Discharge Instructions:   Discharge Instructions    Diet general    Complete by:  As directed    Discharge instructions     Complete by:  As directed    ASA 81 mg PO BID x6 weeks, SCDs, TEDs DNR   Increase activity slowly    Complete by:  As directed        Medication List    TAKE these medications   acetaminophen 325 MG tablet Commonly known as:  TYLENOL Take 650 mg by mouth every 6 (six) hours as needed for moderate pain.   aspirin 81 MG EC tablet Take 1 tablet (81 mg total) by mouth 2 (two) times daily with a meal.   bisacodyl 5 MG EC tablet Commonly known as:  DULCOLAX Take 1 tablet (5 mg total) by mouth daily as needed for moderate constipation.   feeding supplement (GLUCERNA SHAKE) Liqd Take 237 mLs by mouth 3 (three) times daily between meals.   mupirocin ointment 2 % Commonly known as:  BACTROBAN Apply 1 application topically daily. What changed:  how to take this   Vitamin D 2000 units Caps Take 2,000 Units by mouth daily.      Follow-up Information    Swinteck, Cloyde Reams, MD. Schedule an appointment as soon as possible for a visit in 2 week(s).   Specialty:  Orthopedic Surgery Why:  For wound re-check Contact information: [address] [place] [address] [phone]         Duane Lope, MD Follow up in 1 week(s).   Specialty:  Family Medicine Contact information: [address] [place] [address] [phone]            Time coordinating discharge: *35 min  Signed:  Jewelianna Pancoast Juanetta Gosling   [facility] 06/22/2016, 3:12 PM

## 2016-06-22 NOTE — Progress Notes (Signed)
Physical Therapy Treatment Patient Details Name: Nicole Blake MRN: [id number] DOB: [birth year] Today's Date: 06/22/2016    History of Present Illness 82 yo female admitted with L femoral neck fx. S/P L hip hemi 06/20/16. hx of dementia, htn, dm    PT Comments    POD # 3 Assisted with amb pt to and from bathroom only due to limited WBing tolerance through L LE and unstable gait.  Pt plans to D/C to SNF via personal vehicle.   Follow Up Recommendations  SNF     Equipment Recommendations       Recommendations for Other Services       Precautions / Restrictions Precautions Precautions: Fall Restrictions Weight Bearing Restrictions: No LLE Weight Bearing: Weight bearing as tolerated    Mobility  Bed Mobility               General bed mobility comments: oob in recliner  Transfers Overall transfer level: Needs assistance Equipment used: Rolling walker (2 wheeled) Transfers: Sit to/from [employer] Transfers Sit to Stand: Mod assist;Max assist;+2 safety/equipment;+2 physical assistance Stand pivot transfers: Mod assist;Max assist;+2 safety/equipment;+2 physical assistance       General transfer comment: assist to rise from recliner and assisted on/off toilet with 75% VC's on proper hand placemnent and repeat instruction due to [facility]  Ambulation/Gait Ambulation/Gait assistance: Mod assist;Max assist;+2 physical assistance;+2 safety/equipment Ambulation Distance (Feet): 20 Feet (10 feet x 2 to and from bathroom) Assistive device: Rolling walker (2 wheeled) Gait Pattern/deviations: Step-to pattern;Decreased stance time - left;Trunk flexed;Narrow base of support Gait velocity: decreased   General Gait Details: much assist and encouragement to amb to and from bathroom.  Unable to tolerate much WBing through L LE   Stairs            Wheelchair Mobility    Modified Rankin (Stroke Patients Only)       Balance                                     Cognition Arousal/Alertness: Awake/alert Behavior During Therapy: WFL for tasks assessed/performed Overall Cognitive Status: Within Functional Limits for tasks assessed                      Exercises      General Comments        Pertinent Vitals/Pain Pain Assessment: Faces Faces Pain Scale: Hurts little more Pain Location: LLE with stepping Pain Descriptors / Indicators: Discomfort;Grimacing Pain Intervention(s): Monitored during session;Repositioned;Ice applied    Home Living Family/patient expects to be discharged to:: Skilled nursing facility Living Arrangements: Other relatives Available Help at Discharge: Family Type of Home: House Home Access: Stairs to enter Entrance Stairs-Rails: Right Home Layout: Two level Home Equipment: Environmental consultant - 4 wheels      Prior Function Level of Independence: Needs assistance  Gait / Transfers Assistance Needed: uses rollator for ambulation.  ADL's / Homemaking Assistance Needed: Per family, able to feed herself and wash her face.      PT Goals (current goals can now be found in the care plan section) Acute Rehab PT Goals Patient Stated Goal: per family-pt will be able to ambulate in order to return home Progress towards PT goals: Progressing toward goals    Frequency    7X/week      PT Plan Current plan remains appropriate    Co-evaluation  End of Session Equipment Utilized During Treatment: Gait belt Activity Tolerance: Patient limited by pain;Patient limited by fatigue Patient left: in chair;with call bell/phone within reach;with family/visitor present     Time: [id number] PT Time Calculation (min) (ACUTE ONLY): 16 min  Charges:  $Gait Training: 8-22 mins                    G Codes:      Felecia ShellingLori Darene Nappi  PTA [facility]  Acute  Rehab Pager      [phone]

## 2016-06-22 NOTE — Consult Note (Signed)
WOC Nurse wound consult note Reason for Consult:Patient with advanced age sustained two skin tears while at home.Nicole Blake. Saw PCP who prescribed mupirocin ointment and telfa/non-adherent gauze, I will continue this as they have complete confidence in the PCP and state that the areas of tissue loss are healing. Wound type:traumatic Pressure Ulcer POA: No Measurement:proximal measures 3cm x 2cm x 0.2cm, pink, moist and granulating.  Distal is a sickle shaped injury with re approximated tissue covering 75% of wound.  Area not covered is pink, dry. Wound bed:As described above Drainage (amount, consistency, odor) Scant serous at proximal wound, non from distal wound Periwound:Intact, dry Dressing procedure/placement/frequency: I have provided Nursing with guidance via the Orders using a saline cleanse, followed by application of a thin layer of mupirocin ointment performed once daily.  The ointment is covered with a non-adherent gauze and is secured with kerlix roll gauze/tape. WOC nursing team will not follow, but will remain available to this patient, the nursing and medical teams.  Please re-consult if needed. Thanks, Ladona MowLaurie Maude Hettich, MSN, RN, GNP, Hans EdenCWOCN, CWON-AP, FAAN  Pager# [phone]

## 2016-06-22 NOTE — Care Management Note (Signed)
Case Management Note  Patient Details  Name: Nicole Blake MRN: [id number] Date of Birth: 09/30/1914  Subjective/Objective: Left message w/Jill Darleene CleaverLauer c#[phone], w#[phone], to confirm d/c plans-patient is a bundled service. Await call back.PT-recc SNF.CM/CSW following.                   Action/Plan:   Expected Discharge Date:   (unknown)               Expected Discharge Plan:  Skilled Nursing Facility  In-House Referral:  Clinical Social Work  Discharge planning Services  CM Consult  Post Acute Care Choice:    Choice offered to:     DME Arranged:    DME Agency:     HH Arranged:    HH Agency:     Status of Service:  In process, will continue to follow  If discussed at Long Length of Stay Meetings, dates discussed:    Additional Comments:  Lanier ClamMahabir, Nikko Goldwire, RN 06/22/2016, 12:23 PM

## 2016-06-22 NOTE — Clinical Social Work Placement (Signed)
Patient has a bed at [facility]. CSW has completed FL2 & will continue to follow and assist with discharge when ready.    Lincoln MaxinKelly Honour Schwieger, LCSW [facility] Clinical Social Worker cell #: [phone]     CLINICAL SOCIAL WORK PLACEMENT  NOTE  Date:  06/22/2016  Patient Details  Name: Nicole Blake MRN: [id number] Date of Birth: 05/21/1915  Clinical Social Work is seeking post-discharge placement for this patient at the Skilled  Nursing Facility level of care (*CSW will initial, date and re-position this form in  chart as items are completed):  Yes   Patient/family provided with [facility] Clinical Social Work Department's list of facilities offering this level of care within the geographic area requested by the patient (or if unable, by the patient's family).  Yes   Patient/family informed of their freedom to choose among providers that offer the needed level of care, that participate in Medicare, Medicaid or managed care program needed by the patient, have an available bed and are willing to accept the patient.  Yes   Patient/family informed of [facility]'s ownership interest in [facility] and [facility], as well as of the fact that they are under no obligation to receive care at these facilities.  PASRR submitted to EDS on 06/21/16     PASRR number received on 06/21/16     Existing PASRR number confirmed on       FL2 transmitted to all facilities in geographic area requested by pt/family on 06/21/16     FL2 transmitted to all facilities within larger geographic area on       Patient informed that his/her managed care company has contracts with or will negotiate with certain facilities, including the following:        Yes   Patient/family informed of bed offers received.  Patient chooses bed at [facility]     Physician recommends and patient chooses bed at      Patient to be transferred to [facility] on   .  Patient to be transferred to facility by       Patient family notified on   of transfer.  Name of family member notified:        PHYSICIAN Please sign FL2, Please sign DNR     Additional Comment:    _______________________________________________ Arlyss RepressHarrison, Okema Rollinson F, LCSW 06/22/2016, 2:19 PM

## 2016-06-22 NOTE — Clinical Social Work Placement (Signed)
Patient is set to discharge to [facility] today. Patient & niece at bedside & RN, Amy aware. Niece to transport to SNF.     Lincoln MaxinKelly Samera Macy, LCSW [facility] Clinical Social Worker cell #: [phone]    CLINICAL SOCIAL WORK PLACEMENT  NOTE  Date:  06/22/2016  Patient Details  Name: Nicole Blake MRN: [id number] Date of Birth: 09/23/1915  Clinical Social Work is seeking post-discharge placement for this patient at the Skilled  Nursing Facility level of care (*CSW will initial, date and re-position this form in  chart as items are completed):  Yes   Patient/family provided with [facility] Clinical Social Work Department's list of facilities offering this level of care within the geographic area requested by the patient (or if unable, by the patient's family).  Yes   Patient/family informed of their freedom to choose among providers that offer the needed level of care, that participate in Medicare, Medicaid or managed care program needed by the patient, have an available bed and are willing to accept the patient.  Yes   Patient/family informed of [facility]'s ownership interest in [facility] and [facility], as well as of the fact that they are under no obligation to receive care at these facilities.  PASRR submitted to EDS on 06/21/16     PASRR number received on 06/21/16     Existing PASRR number confirmed on       FL2 transmitted to all facilities in geographic area requested by pt/family on 06/21/16     FL2 transmitted to all facilities within larger geographic area on       Patient informed that his/her managed care company has contracts with or will negotiate with certain facilities, including the following:        Yes   Patient/family informed of bed offers received.  [facility]     Physician recommends and patient chooses bed at      Patient to be [facility] to [facility] on  06/22/16.  Patient to be transferred to facility by patient's niece at bedside to transport     Patient family notified on 06/22/16 of transfer.  Name of family member notified:  patient's niece at bedside     PHYSICIAN Please sign FL2, Please sign DNR     Additional Comment:    _______________________________________________ Arlyss RepressHarrison, Udell Mazzocco F, LCSW 06/22/2016, 3:23 PM

## 2016-06-22 NOTE — Care Management Note (Signed)
Case Management Note  Patient Details  Name: Nicole Blake MRN: [id number] Date of Birth: 01/11/1915  Subjective/Objective: Received call back from Nicole Blake-she will confirm d/c plan w/orthopedic, & let me know if d/c plan is SNF-await call back.                   Action/Plan:   Expected Discharge Date:   (unknown)               Expected Discharge Plan:  Skilled Nursing Facility  In-House Referral:  Clinical Social Work  Discharge planning Services  CM Consult  Post Acute Care Choice:    Choice offered to:     DME Arranged:    DME Agency:     HH Arranged:    HH Agency:     Status of Service:  In process, will continue to follow  If discussed at Long Length of Stay Meetings, dates discussed:    Additional Comments:  Nicole Blake, Nicole Spaeth, RN 06/22/2016, 12:41 PM

## 2016-06-22 NOTE — Evaluation (Signed)
Occupational Therapy Evaluation Patient Details Name: Nicole FeeMartha J Glymph MRN: [id number] DOB: 01/31/1915 Today's Date: 06/22/2016    History of Present Illness 82 yo female admitted with L femoral neck fx. S/P L hip hemi 06/20/16. hx of dementia, htn, dm   Clinical Impression   Pt is s/p L hemi hip resulting in the deficits listed below (see OT Problem List).  Pt will benefit from skilled OT to increase their safety and independence with ADL and functional mobility for ADL to facilitate discharge to venue listed below.     Follow Up Recommendations  SNF    Equipment Recommendations  None recommended by OT       Precautions / Restrictions Precautions Precautions: Fall Restrictions Weight Bearing Restrictions: No LLE Weight Bearing: Weight bearing as tolerated      Mobility Bed Mobility               General bed mobility comments: oob in recliner  Transfers Overall transfer level: Needs assistance Equipment used: Rolling walker (2 wheeled) Transfers: Sit to/from Stand Sit to Stand: Max assist Stand pivot transfers: Max assist       General transfer comment:  Assist to rise, stabilize, control descent. Multimodal cues for safety, technique, hand placement.          ADL Overall ADL's : Needs assistance/impaired     Grooming: Minimal assistance;Sitting   Upper Body Bathing: Minimal assitance;Sitting   Lower Body Bathing: Maximal assistance;Sit to/from stand       Lower Body Dressing: Maximal assistance;Sit to/from stand;Cueing for sequencing;Cueing for compensatory techniques;+2 for physical assistance;+2 for safety/equipment   Toilet Transfer: Maximal assistance;RW;Ambulation;Cueing for safety;Cueing for sequencing;+2 for safety/equipment;+2 for physical assistance   Toileting- Clothing Manipulation and Hygiene: Maximal assistance;Sit to/from stand;+2 for physical assistance;+2 for safety/equipment                         Pertinent  Vitals/Pain Pain Assessment: Faces Faces Pain Scale: Hurts little more Pain Location: LLE with stepping Pain Descriptors / Indicators: Aching;Sore Pain Intervention(s): Monitored during session;Repositioned;Patient requesting pain meds-RN notified        Extremity/Trunk Assessment Upper Extremity Assessment Upper Extremity Assessment: Generalized weakness              Cognition Arousal/Alertness: Awake/alert Behavior During Therapy: WFL for tasks assessed/performed (will impulsively sit) Overall Cognitive Status: History of cognitive impairments - at baseline                                Home Living Family/patient expects to be discharged to:: Skilled nursing facility Living Arrangements: Other relatives Available Help at Discharge: Family Type of Home: House Home Access: Stairs to enter Secretary/administratorntrance Stairs-Number of Steps: 4 Entrance Stairs-Rails: Right Home Layout: Two level Alternate Level Stairs-Number of Steps: 9 Alternate Level Stairs-Rails: Right           Home Equipment: Walker - 4 wheels          Prior Functioning/Environment Level of Independence: Needs assistance  Gait / Transfers Assistance Needed: uses rollator for ambulation.  ADL's / Homemaking Assistance Needed: Per family, able to feed herself and wash her face.             OT Problem List: Decreased strength;Pain;Decreased safety awareness;Impaired balance (sitting and/or standing)      OT Goals(Current goals can be found in the care plan section) Acute Rehab OT Goals Patient Stated Goal:  per family-pt will be able to ambulate in order to return home OT Goal Formulation: With patient Time For Goal Achievement: 06/29/16  OT Frequency:                End of Session Equipment Utilized During Treatment: Rolling walker Nurse Communication: Mobility status;Patient requests pain meds  Activity Tolerance: Patient limited by pain Patient left: in chair;with call bell/phone  within reach;with family/visitor present   Time: 1610-9604 OT Time Calculation (min): 22 min Charges:  OT General Charges $OT Visit: 1 Procedure OT Evaluation $OT Eval Moderate Complexity: 1 Procedure G-Codes:    Alba Cory 07/08/16, 12:40 PM

## 2016-06-23 ENCOUNTER — Ambulatory Visit: Payer: Medicare Other

## 2016-06-23 ENCOUNTER — Non-Acute Institutional Stay (SKILLED_NURSING_FACILITY): Payer: Medicare Other | Admitting: Internal Medicine

## 2016-06-23 ENCOUNTER — Encounter: Payer: Self-pay | Admitting: Internal Medicine

## 2016-06-23 DIAGNOSIS — S72002A Fracture of unspecified part of neck of left femur, initial encounter for closed fracture: Secondary | ICD-10-CM

## 2016-06-23 DIAGNOSIS — I10 Essential (primary) hypertension: Secondary | ICD-10-CM

## 2016-06-23 DIAGNOSIS — E119 Type 2 diabetes mellitus without complications: Secondary | ICD-10-CM | POA: Diagnosis not present

## 2016-06-23 DIAGNOSIS — D62 Acute posthemorrhagic anemia: Secondary | ICD-10-CM

## 2016-06-23 DIAGNOSIS — R600 Localized edema: Secondary | ICD-10-CM

## 2016-06-23 DIAGNOSIS — E440 Moderate protein-calorie malnutrition: Secondary | ICD-10-CM

## 2016-06-23 DIAGNOSIS — S51802D Unspecified open wound of left forearm, subsequent encounter: Secondary | ICD-10-CM

## 2016-06-23 DIAGNOSIS — S51812D Laceration without foreign body of left forearm, subsequent encounter: Secondary | ICD-10-CM

## 2016-06-23 NOTE — Progress Notes (Signed)
: Provider:  Randon GoldsmithAnne D. Lyn HollingsheadAlexander, MD Location:  [facility] Room Number: 101 P Place of Service:  SNF [phone])  PCP:  Duane Lopeoss, Alan, MD Patient Care Team: Gildardo Crankerharles Ross, MD as PCP - General (Family Medicine) Gildardo Crankerharles Ross, MD (Family Medicine)  Extended Emergency Contact Information Primary Emergency Contact: [facility],Sandra Address: [address]          [place], [place] [address] [place] Home Phone: [phone] Mobile Phone: [phone] Relation: Niece     Allergies: Doxycycline; Robitussin (alcohol free) [guaifenesin]; Septra [sulfamethoxazole-trimethoprim]; and Sulfa antibiotics  Chief Complaint  Patient presents with  . New Admit To SNF    Admit to Facility    HPI: Patient is 82 y.o. female with medical history significant of HTN, diet-controled DM, dementia, who presents with left hip pain after fall. pt had fall 3 weeks ago. Had a negative [facility] and  was initially diagnosed with a knee ligament injury, but the patient has been unable to put weight on her left leg due to pain since her injury. [facility] and was diagnosed with a left hip fracture. [facility] where she underwent a repair.Pt had post -op anemia but I don't see she had a transfusion. Pt is admitted to SNF for OT/PT. While at SNF pt will be followed for HTN, tx with no meds but will monitor as it was elevated in the hospital, DM, tx with diet, and dementia, tx with prn ativan.  Past Medical History:  Diagnosis Date  . Dementia   . Diabetes mellitus without complication (HCC)   . Hypertension     Past Surgical History:  Procedure Laterality Date  . ANTERIOR APPROACH HEMI HIP ARTHROPLASTY Left 06/20/2016   Procedure: ANTERIOR APPROACH HEMI HIP ARTHROPLASTY;  Surgeon: Samson FredericBrian Swinteck, MD;  Location: [facility] ORS;  Service: Orthopedics;  Laterality: Left;      Medication List       Accurate as of 06/23/16 11:59  PM. Always use your most recent med list.          acetaminophen 325 MG tablet Commonly known as:  TYLENOL Take 650 mg by mouth every 6 (six) hours as needed for moderate pain.   aspirin 81 MG EC tablet Take 1 tablet (81 mg total) by mouth 2 (two) times daily with a meal.   bisacodyl 5 MG EC tablet Commonly known as:  DULCOLAX Take 1 tablet (5 mg total) by mouth daily as needed for moderate constipation.   feeding supplement (GLUCERNA SHAKE) Liqd Take 237 mLs by mouth 3 (three) times daily between meals.   mupirocin ointment 2 % Commonly known as:  BACTROBAN Apply 1 application topically daily.   Vitamin D 2000 units Caps Take 2,000 Units by mouth daily.       No orders of the defined types were placed in this encounter.   Immunization History  Administered Date(s) Administered  . PPD Test 06/22/2016    Social History  Substance Use Topics  . Smoking status: Never Smoker  . Smokeless tobacco: Never Used  . Alcohol use No    Family history is UTO 2/2 dementia  No family history on file.    Review of Systems  UTO 2/2 dmenti a; poer nursing some concern for swelling L leg     Vitals:   06/23/16 0949  BP: (!) 153/53  Pulse: 81  Resp: 16  Temp: 97.8 F (36.6  C)    SpO2 Readings from Last 1 Encounters:  06/22/16 98%   Body mass index is 21.24 kg/m.     Physical Exam  GENERAL APPEARANCE: Alert, conversant,  No acute distress.  SKIN: incision site without redness or unusual heat HEAD: Normocephalic, atraumatic  EYES: Conjunctiva/lids clear. Pupils round, reactive. EOMs intact.  EARS: External exam WNL, canals clear. Hearing grossly normal.  NOSE: No deformity or discharge.  MOUTH/THROAT: Lips w/o lesions  RESPIRATORY: Breathing is even, unlabored. Lung sounds are clear   CARDIOVASCULAR: Heart RRR with high pitched systolic  Murmur, no, rubs or gallops. + LLE  peripheral edema.   GASTROINTESTINAL: Abdomen is soft, non-tender, not distended w/  normal bowel sounds. GENITOURINARY: Bladder non tender, not distended  MUSCULOSKELETAL: No abnormal joints or musculature NEUROLOGIC:  Cranial nerves 2-12 grossly intact. Moves all extremities  PSYCHIATRIC: Mood and affect appropriate to situation with dementia, no behavioral issues  Patient Active Problem List   Diagnosis Date Noted  . Postoperative anemia due to acute blood loss 07/01/2016  . Left displaced femoral neck fracture (HCC) 06/20/2016  . Fall 06/19/2016  . Displaced fracture of left femoral neck (HCC) 06/19/2016  . Closed left hip fracture (HCC) 06/19/2016  . Skin tear of forearm without complication 06/19/2016  . Protein-calorie malnutrition, moderate (HCC) 06/19/2016  . Dementia without behavioral disturbance 06/19/2016  . Hypertension   . Diabetes mellitus without complication (HCC)   . Essential hypertension   . Aspiration pneumonia (HCC)   . Nausea and vomiting 12/05/2014  . Emesis 12/05/2014  . CAP (community acquired pneumonia) 12/05/2014      Labs reviewed: Basic Metabolic Panel:    Component Value Date/Time   NA 141 06/22/2016 0425   NA 141 06/22/2016   K 3.8 06/22/2016 0425   CL 113 (H) 06/22/2016 0425   CO2 23 06/22/2016 0425   GLUCOSE 92 06/22/2016 0425   BUN 24 (H) 06/22/2016 0425   BUN 24 (A) 06/22/2016   CREATININE 1.01 (H) 06/22/2016 0425   CALCIUM 8.2 (L) 06/22/2016 0425   PROT 5.7 (L) 06/21/2016 0418   ALBUMIN 2.8 (L) 06/21/2016 0418   AST 18 06/21/2016 0418   ALT 11 (L) 06/21/2016 0418   ALKPHOS 68 06/21/2016 0418   BILITOT 0.3 06/21/2016 0418   GFRNONAA 44 (L) 06/22/2016 0425   GFRAA 52 (L) 06/22/2016 0425     Recent Labs  06/19/16 1727 06/21/16 06/21/16 0418 06/22/16 06/22/16 0425  NA 139 138 138 141 141  K 4.1 4.4 4.4  --  3.8  CL 104  --  107  --  113*  CO2 25  --  23  --  23  GLUCOSE 146*  --  123*  --  92  BUN 23* 26* 26* 24* 24*  CREATININE 1.02* 1.2* 1.15* 1.0 1.01*  CALCIUM 9.4  --  8.3*  --  8.2*   Liver  Function Tests:  Recent Labs  12/19/15 2223 06/21/16 0418  AST 28 18  ALT 15 11*  ALKPHOS 50 68  BILITOT 0.4 0.3  PROT 5.9* 5.7*  ALBUMIN 3.3* 2.8*   No results for input(s): LIPASE, AMYLASE in the last 8760 hours. No results for input(s): AMMONIA in the last 8760 hours. CBC:  Recent Labs  12/19/15 2223  06/19/16 1830 06/21/16 06/21/16 0418 06/22/16 06/22/16 0425  WBC 6.2  < > 10.5 10.5 10.5 9.1 9.1  NEUTROABS 4.3  --  7.8*  --   --   --   --  HGB 12.7  < > 13.0 10.5* 10.5*  --  9.9*  HCT 39.2  < > 40.1 32* 31.8*  --  30.6*  MCV 89.1  --  88.7  --  87.4  --  89.2  PLT 186  < > 282 221 221  --  209  < > = values in this interval not displayed. Lipid No results for input(s): CHOL, HDL, LDLCALC, TRIG in the last 8760 hours.  Cardiac Enzymes: No results for input(s): CKTOTAL, CKMB, CKMBINDEX, TROPONINI in the last 8760 hours. BNP: No results for input(s): BNP in the last 8760 hours. No results found for: [facility] Lab Results  Component Value Date   HGBA1C 6.2 (H) 06/19/2016   No results found for: TSH No results found for: VITAMINB12 No results found for: FOLATE No results found for: IRON, TIBC, FERRITIN  Imaging and Procedures obtained prior to SNF admission: Dg Chest 1 View  Result Date: 06/19/2016 CLINICAL DATA:  Larey Seat 3 weeks ago.  Right hip pain. EXAM: CHEST 1 VIEW COMPARISON:  12/19/2015 FINDINGS: The heart is normal in size. Stable marked tortuosity and ectasia of the thoracic aorta. The lungs are clear. No pleural effusion. Prominent skin fold noted over the left chest. The bony thorax is intact. No obvious rib fractures. IMPRESSION: No acute cardiopulmonary findings. Electronically Signed   By: Rudie Meyer M.D.   On: 06/19/2016 19:09   Pelvis Portable  Result Date: 06/20/2016 CLINICAL DATA:  Left hip replacement. EXAM: PORTABLE PELVIS 1-2 VIEWS COMPARISON:  Earlier today.  Pelvis CT dated 12/05/2014. FINDINGS: Left hip bipolar prosthesis in satisfactory  position and alignment. No fracture dislocation seen. Diffuse osteopenia. Lower thoracic spine degenerative changes. Uterine fibroid calcifications in the right pelvis. IMPRESSION: Satisfactory postoperative appearance of a left hip prosthesis. Electronically Signed   By: Beckie Salts M.D.   On: 06/20/2016 15:17   Dg C-arm 1-60 Min-no Report  Result Date: 06/20/2016 CLINICAL DATA: surgery C-ARM 1-60 MINUTES Fluoroscopy was utilized by the requesting physician.  No radiographic interpretation.   Dg Hip Operative Unilat W Or W/o Pelvis Left  Result Date: 06/20/2016 CLINICAL DATA:  Hemiarthroplasty EXAM: OPERATIVE LEFT HIP (WITH PELVIS IF PERFORMED) 3 VIEWS TECHNIQUE: Fluoroscopic spot image(s) were submitted for interpretation post-operatively. COMPARISON:  Preop 06/19/2016 FINDINGS: Findings document interval left hip arthroplasty, components project in expected location without fracture or dislocation. IMPRESSION: Left hip arthroplasty Electronically Signed   By: Corlis Leak M.D.   On: 06/20/2016 14:53   Dg Hip Unilat W Or Wo Pelvis 2-3 Views Left  Result Date: 06/19/2016 CLINICAL DATA:  Larey Seat 3 weeks ago.  Injured left hip. EXAM: DG HIP (WITH OR WITHOUT PELVIS) 2-3V LEFT COMPARISON:  None. FINDINGS: Difficult examination due to the position of the patient. There is a displaced left femoral neck fracture. The right hip is intact. The bony pelvis is grossly intact. IMPRESSION: Displaced left femoral neck fracture. Electronically Signed   By: Rudie Meyer M.D.   On: 06/19/2016 19:10     Not all labs, radiology exams or other studies done during hospitalization come through on my EPIC note; however they are reviewed by me.    Assessment and Plan  Displaced fracture of left femoral neck (HCC) S/p repair Dr Veda Canning; prophylaxis with ASA 81 mg BID for 6 weeks SNF - admitted for OT/PT; cont ASA 81 mg BID for 6 weeks; pt has been started on Vit d 2000 u daily  Postoperative anemia due to acute blood  loss SNF - d/c  Hb 7.8; will fu CBC  Essential hypertension Was elevated in hospital, not d/c on meds SNF - monitor BP, may ned to start meds  Diabetes mellitus without complication (HCC) SNF - A1c 6.2 with diet control;will cont low carb diet  Dementia without behavioral disturbance SNF - pt on no meds; at age 55 this is resonable;will monotr for behavoirs  Skin tear of forearm without complication SNF - will have wound care follow  Protein-calorie malnutrition, moderate (HCC) SNF - will be given supplements  Time spent > 45 min;> 50% of time with patient was spent reviewing records, labs, tests and studies, counseling and developing plan of care  Thurston Hole D. Lyn Hollingshead, MD

## 2016-06-29 LAB — CBC AND DIFFERENTIAL
HEMATOCRIT: 31 % — AB (ref 36–46)
HEMOGLOBIN: 9.8 g/dL — AB (ref 12.0–16.0)
PLATELETS: 243 10*3/uL (ref 150–399)
WBC: 7.9 10^3/mL

## 2016-06-29 LAB — BASIC METABOLIC PANEL
BUN: 21 mg/dL (ref 4–21)
Creatinine: 1 mg/dL (ref 0.5–1.1)
Glucose: 240 mg/dL
Potassium: 4.3 mmol/L (ref 3.4–5.3)
SODIUM: 134 mmol/L — AB (ref 137–147)

## 2016-07-01 ENCOUNTER — Encounter: Payer: Self-pay | Admitting: Internal Medicine

## 2016-07-01 DIAGNOSIS — D62 Acute posthemorrhagic anemia: Secondary | ICD-10-CM | POA: Insufficient documentation

## 2016-07-01 DIAGNOSIS — R600 Localized edema: Secondary | ICD-10-CM | POA: Insufficient documentation

## 2016-07-01 NOTE — Assessment & Plan Note (Signed)
SNF - d/c Hb 7.8; will fu CBC

## 2016-07-01 NOTE — Assessment & Plan Note (Signed)
SNF - more swollen post surgery than expected; have ordered LLE  U/S

## 2016-07-01 NOTE — Assessment & Plan Note (Addendum)
S/p repair Dr Veda CanningSwintek; prophylaxis with ASA 81 mg BID for 6 weeks SNF - admitted for OT/PT; cont ASA 81 mg BID for 6 weeks; pt has been started on Vit d 2000 u daily

## 2016-07-01 NOTE — Assessment & Plan Note (Signed)
SNF - pt on no meds; at age 82 this is resonable;will monotr for behavoirs

## 2016-07-01 NOTE — Assessment & Plan Note (Signed)
SNF - A1c 6.2 with diet control;will cont low carb diet

## 2016-07-01 NOTE — Assessment & Plan Note (Signed)
Was elevated in hospital, not d/c on meds SNF - monitor BP, may ned to start meds

## 2016-07-01 NOTE — Assessment & Plan Note (Signed)
SNF - will be given supplements

## 2016-07-01 NOTE — Assessment & Plan Note (Signed)
SNF - will have wound care follow

## 2016-07-02 ENCOUNTER — Encounter: Payer: Self-pay | Admitting: Internal Medicine

## 2016-07-02 ENCOUNTER — Non-Acute Institutional Stay (SKILLED_NURSING_FACILITY): Payer: Medicare Other | Admitting: Internal Medicine

## 2016-07-02 DIAGNOSIS — Z7189 Other specified counseling: Secondary | ICD-10-CM | POA: Diagnosis not present

## 2016-07-02 NOTE — Progress Notes (Signed)
Location:  [facility] Room Number: 101 P Place of Service:  SNF (31)  Nicole Blake. Lyn Hollingshead, MD  Patient Care Team: Gildardo Cranker, MD as PCP - General (Family Medicine) Gildardo Cranker, MD [facility] Medicine)  Extended Emergency Contact Information Primary Emergency Contact: [facility] Address: [address]          [place], [place] [address] Darden [place] of [place] Home Phone: [phone] Mobile Phone: [phone] Relation: Niece    Allergies: Doxycycline; Robitussin (alcohol free) [guaifenesin]; Septra [sulfamethoxazole-trimethoprim]; and Sulfa antibiotics  Chief Complaint  Patient presents with  . Acute Visit    Acute    HPI: Patient is 82 y.o. female whose daughter has several concerns she wishes to discuss. One is that she thinks Mom is in pain and would like to have her tylenol scheduled. She is also concerned about all the bruising on her mothers arms that has been going on and getting worse since admit to SNF.  Past Medical History:  Diagnosis Date  . Dementia   . Diabetes mellitus without complication (HCC)   . Hypertension     Past Surgical History:  Procedure Laterality Date  . ANTERIOR APPROACH HEMI HIP ARTHROPLASTY Left 06/20/2016   Procedure: ANTERIOR APPROACH HEMI HIP ARTHROPLASTY;  Surgeon: Samson Frederic, MD;  Location: [facility] ORS;  Service: Orthopedics;  Laterality: Left;      Medication List       Accurate as of 07/02/16  2:18 PM. Always use your most recent med list.          acetaminophen 325 MG tablet Commonly known as:  TYLENOL Take 650 mg by mouth 3 (three) times daily.   aspirin EC 81 MG tablet Take 81 mg by mouth 2 (two) times daily with a meal. After 08/03/16  continue taking once daily for 2 weeks   bisacodyl 5 MG EC tablet Commonly known as:  DULCOLAX Take 1 tablet (5 mg total) by mouth daily as needed for moderate constipation.   D3-1000 1000 units tablet Generic drug:  Cholecalciferol Take 2,000 Units  by mouth daily. 2 tablets   GLUCERNA Liqd Take 237 mLs by mouth 2 (two) times daily between meals.   mupirocin ointment 2 % Commonly known as:  BACTROBAN Apply 1 application topically daily. Apply to left arm       Meds ordered this encounter  Medications  . aspirin EC 81 MG tablet    Sig: Take 81 mg by mouth 2 (two) times daily with a meal. After 08/03/16  continue taking once daily for 2 weeks  . Cholecalciferol (D3-1000) 1000 units tablet    Sig: Take 2,000 Units by mouth daily. 2 tablets    Immunization History  Administered Date(s) Administered  . PPD Test 06/22/2016    Social History  Substance Use Topics  . Smoking status: Never Smoker  . Smokeless tobacco: Never Used  . Alcohol use No    Review of Systems  DATA OBTAINED: from daughter GENERAL:  no fevers, fatigue, appetite changes SKIN: bruising to BUE HEENT: No complaint RESPIRATORY: No cough, wheezing, SOB CARDIAC: No chest pain, palpitations, lower extremity edema  GI: No abdominal pain, No N/V/D or constipation, No heartburn or reflux  GU: No dysuria, frequency or urgency, or incontinence  MUSCULOSKELETAL: some hip pain NEUROLOGIC: No headache, dizziness  PSYCHIATRIC: No overt anxiety or sadness  Vitals:   07/02/16 1354  BP: (!) 161/77  Pulse: 83  Resp: (!) 97  Temp: 97.9 F (36.6 C)  Body mass index is 21.08 kg/m. Physical Exam  GENERAL APPEARANCE: Alert, conversant, No acute distress  SKIN: No diaphoresis rash HEENT: Unremarkable RESPIRATORY: Breathing is even, unlabored. Lung sounds are clear   CARDIOVASCULAR: Heart RRR no murmurs, rubs or gallops. No peripheral edema  GASTROINTESTINAL: Abdomen is soft, non-tender, not distended w/ normal bowel sounds.  GENITOURINARY: Bladder non tender, not distended  MUSCULOSKELETAL: No abnormal joints or musculature NEUROLOGIC: Cranial nerves 2-12 grossly intact. Moves all extremities PSYCHIATRIC: dementia, no behavioral issues  Patient Active  Problem List   Diagnosis Date Noted  . Postoperative anemia due to acute blood loss 07/01/2016  . Edema of left lower extremity 07/01/2016  . Left displaced femoral neck fracture (HCC) 06/20/2016  . Fall 06/19/2016  . Displaced fracture of left femoral neck (HCC) 06/19/2016  . Closed left hip fracture (HCC) 06/19/2016  . Skin tear of forearm without complication 06/19/2016  . Protein-calorie malnutrition, moderate (HCC) 06/19/2016  . Dementia without behavioral disturbance 06/19/2016  . Hypertension   . Diabetes mellitus without complication (HCC)   . Essential hypertension   . Aspiration pneumonia (HCC)   . Nausea and vomiting 12/05/2014  . Emesis 12/05/2014  . CAP (community acquired pneumonia) 12/05/2014    CMP     Component Value Date/Time   NA 134 (A) 06/29/2016   K 4.3 06/29/2016   CL 113 (H) 06/22/2016 0425   CO2 23 06/22/2016 0425   GLUCOSE 92 06/22/2016 0425   BUN 21 06/29/2016   CREATININE 1.0 06/29/2016   CREATININE 1.01 (H) 06/22/2016 0425   CALCIUM 8.2 (L) 06/22/2016 0425   PROT 5.7 (L) 06/21/2016 0418   ALBUMIN 2.8 (L) 06/21/2016 0418   AST 18 06/21/2016 0418   ALT 11 (L) 06/21/2016 0418   ALKPHOS 68 06/21/2016 0418   BILITOT 0.3 06/21/2016 0418   GFRNONAA 44 (L) 06/22/2016 0425   GFRAA 52 (L) 06/22/2016 0425    Recent Labs  06/19/16 1727  06/21/16 0418 06/22/16 06/22/16 0425 06/29/16  NA 139  < > 138 141 141 134*  K 4.1  < > 4.4  --  3.8 4.3  CL 104  --  107  --  113*  --   CO2 25  --  23  --  23  --   GLUCOSE 146*  --  123*  --  92  --   BUN 23*  < > 26* 24* 24* 21  CREATININE 1.02*  < > 1.15* 1.0 1.01* 1.0  CALCIUM 9.4  --  8.3*  --  8.2*  --   < > = values in this interval not displayed.  Recent Labs  12/19/15 2223 06/21/16 0418  AST 28 18  ALT 15 11*  ALKPHOS 50 68  BILITOT 0.4 0.3  PROT 5.9* 5.7*  ALBUMIN 3.3* 2.8*    Recent Labs  12/19/15 2223  06/19/16 1830  06/21/16 0418 06/22/16 06/22/16 0425 06/29/16  WBC 6.2  < >  10.5  < > 10.5 9.1 9.1 7.9  NEUTROABS 4.3  --  7.8*  --   --   --   --   --   HGB 12.7  < > 13.0  < > 10.5*  --  9.9* 9.8*  HCT 39.2  < > 40.1  < > 31.8*  --  30.6* 31*  MCV 89.1  --  88.7  --  87.4  --  89.2  --   PLT 186  < > 282  < > 221  --  209 243  < > = values in this interval not displayed. No results for input(s): CHOL, LDLCALC, TRIG in the last 8760 hours.  Invalid input(s): HCL No results found for: MICROALBUR No results found for: TSH Lab Results  Component Value Date   HGBA1C 6.2 (H) 06/19/2016   No results found for: CHOL, HDL, LDLCALC, LDLDIRECT, TRIG, CHOLHDL  Significant Diagnostic Results in last 30 days:  Dg Chest 1 View  Result Date: 06/19/2016 CLINICAL DATA:  Larey Seat 3 weeks ago.  Right hip pain. EXAM: CHEST 1 VIEW COMPARISON:  12/19/2015 FINDINGS: The heart is normal in size. Stable marked tortuosity and ectasia of the thoracic aorta. The lungs are clear. No pleural effusion. Prominent skin fold noted over the left chest. The bony thorax is intact. No obvious rib fractures. IMPRESSION: No acute cardiopulmonary findings. Electronically Signed   By: Rudie Meyer M.D.   On: 06/19/2016 19:09   Pelvis Portable  Result Date: 06/20/2016 CLINICAL DATA:  Left hip replacement. EXAM: PORTABLE PELVIS 1-2 VIEWS COMPARISON:  Earlier today.  Pelvis CT dated 12/05/2014. FINDINGS: Left hip bipolar prosthesis in satisfactory position and alignment. No fracture dislocation seen. Diffuse osteopenia. Lower thoracic spine degenerative changes. Uterine fibroid calcifications in the right pelvis. IMPRESSION: Satisfactory postoperative appearance of a left hip prosthesis. Electronically Signed   By: Beckie Salts M.D.   On: 06/20/2016 15:17   Dg C-arm 1-60 Min-no Report  Result Date: 06/20/2016 CLINICAL DATA: surgery C-ARM 1-60 MINUTES Fluoroscopy was utilized by the requesting physician.  No radiographic interpretation.   Dg Hip Operative Unilat W Or W/o Pelvis Left  Result Date:  06/20/2016 CLINICAL DATA:  Hemiarthroplasty EXAM: OPERATIVE LEFT HIP (WITH PELVIS IF PERFORMED) 3 VIEWS TECHNIQUE: Fluoroscopic spot image(s) were submitted for interpretation post-operatively. COMPARISON:  Preop 06/19/2016 FINDINGS: Findings document interval left hip arthroplasty, components project in expected location without fracture or dislocation. IMPRESSION: Left hip arthroplasty Electronically Signed   By: Corlis Leak M.D.   On: 06/20/2016 14:53   Dg Hip Unilat W Or Wo Pelvis 2-3 Views Left  Result Date: 06/19/2016 CLINICAL DATA:  Larey Seat 3 weeks ago.  Injured left hip. EXAM: DG HIP (WITH OR WITHOUT PELVIS) 2-3V LEFT COMPARISON:  None. FINDINGS: Difficult examination due to the position of the patient. There is a displaced left femoral neck fracture. The right hip is intact. The bony pelvis is grossly intact. IMPRESSION: Displaced left femoral neck fracture. Electronically Signed   By: Rudie Meyer M.D.   On: 06/19/2016 19:10    Assessment and Plan  ENCOUNTER FOR FAMILY CONFERENCE WITH PT PRESENT - we are going to schedule pt's tylenol and put elastic sleeves in both arms. I spoke with Dr Veda Canning and he did not want to decreased pt's prophylactic ASA 81 mg BID to daily but he did say it was OK to shorten it from 6 weeks to 4 weeks then ASA 81 mg daily for 2 weeks after.    Time spent > 25 min Taneika Choi D. Lyn Hollingshead, MD

## 2016-07-03 ENCOUNTER — Emergency Department (HOSPITAL_COMMUNITY)
Admission: EM | Admit: 2016-07-03 | Discharge: 2016-07-03 | Disposition: A | Discharge status: Home / Self Care | Payer: Medicare Other | Attending: Emergency Medicine | Admitting: Emergency Medicine

## 2016-07-03 ENCOUNTER — Emergency Department (HOSPITAL_COMMUNITY): Payer: Medicare Other

## 2016-07-03 ENCOUNTER — Encounter (HOSPITAL_COMMUNITY): Payer: Self-pay | Admitting: Emergency Medicine

## 2016-07-03 DIAGNOSIS — Z7982 Long term (current) use of aspirin: Secondary | ICD-10-CM | POA: Insufficient documentation

## 2016-07-03 DIAGNOSIS — S0990XA Unspecified injury of head, initial encounter: Secondary | ICD-10-CM | POA: Insufficient documentation

## 2016-07-03 DIAGNOSIS — S0181XA Laceration without foreign body of other part of head, initial encounter: Secondary | ICD-10-CM | POA: Diagnosis not present

## 2016-07-03 DIAGNOSIS — Z96642 Presence of left artificial hip joint: Secondary | ICD-10-CM | POA: Insufficient documentation

## 2016-07-03 DIAGNOSIS — Y999 Unspecified external cause status: Secondary | ICD-10-CM | POA: Insufficient documentation

## 2016-07-03 DIAGNOSIS — E119 Type 2 diabetes mellitus without complications: Secondary | ICD-10-CM | POA: Insufficient documentation

## 2016-07-03 DIAGNOSIS — S01511A Laceration without foreign body of lip, initial encounter: Secondary | ICD-10-CM | POA: Insufficient documentation

## 2016-07-03 DIAGNOSIS — I10 Essential (primary) hypertension: Secondary | ICD-10-CM | POA: Insufficient documentation

## 2016-07-03 DIAGNOSIS — Y929 Unspecified place or not applicable: Secondary | ICD-10-CM | POA: Insufficient documentation

## 2016-07-03 DIAGNOSIS — W06XXXA Fall from bed, initial encounter: Secondary | ICD-10-CM | POA: Insufficient documentation

## 2016-07-03 DIAGNOSIS — S0191XA Laceration without foreign body of unspecified part of head, initial encounter: Secondary | ICD-10-CM | POA: Diagnosis not present

## 2016-07-03 DIAGNOSIS — Y939 Activity, unspecified: Secondary | ICD-10-CM | POA: Insufficient documentation

## 2016-07-03 MED ORDER — AMOXICILLIN 500 MG PO CAPS: 500.0000 mg | ORAL_CAPSULE | Freq: Three times a day (TID) | ORAL | 0 refills | Status: DC

## 2016-07-03 NOTE — ED Triage Notes (Signed)
DNR noted

## 2016-07-03 NOTE — ED Triage Notes (Signed)
Pt comes from [facility], had a unwitness fall at 4 am tonight, hit her chin, and right forehead above eye. Hematoma noted to right forehead and lac 1 inch on chin. Controlled bleeding. Pt has dementia at base line, can walk on her own, with out assist.  allergies noted. No blood thiners.hx of HTN and diabetes. Vs 140/84 hr84, 98 room air.

## 2016-07-03 NOTE — ED Provider Notes (Signed)
[facility]-EMERGENCY DEPT Provider Note   CSN: [id number] Arrival date & time: 07/03/16  4098     History   Chief Complaint Chief Complaint  Patient presents with  . Fall    lac chin and hemtoma to right eye     HPI Nicole Blake is a 82 y.o. female.  The history is provided by the patient. No language interpreter was used.  Fall  This is a new problem. The current episode started 1 to 2 hours ago. The problem occurs constantly. The problem has not changed since onset.Nothing aggravates the symptoms. Nothing relieves the symptoms. She has tried nothing for the symptoms.   Pt is in a nursing home having rehab after falling and breaking her hip.  Pt has a ground level bed.  Daughter reports pt fell out of bed.  She is unsure of what pt hit.  Pt has a knot on her forehead. Pt has a bruise and lav=cerations to her mouth and chin.  Past Medical History:  Diagnosis Date  . Dementia   . Diabetes mellitus without complication (HCC)   . Hypertension     Patient Active Problem List   Diagnosis Date Noted  . Postoperative anemia due to acute blood loss 07/01/2016  . Edema of left lower extremity 07/01/2016  . Left displaced femoral neck fracture (HCC) 06/20/2016  . Fall 06/19/2016  . Displaced fracture of left femoral neck (HCC) 06/19/2016  . Closed left hip fracture (HCC) 06/19/2016  . Skin tear of forearm without complication 06/19/2016  . Protein-calorie malnutrition, moderate (HCC) 06/19/2016  . Dementia without behavioral disturbance 06/19/2016  . Hypertension   . Diabetes mellitus without complication (HCC)   . Essential hypertension   . Aspiration pneumonia (HCC)   . Nausea and vomiting 12/05/2014  . Emesis 12/05/2014  . CAP (community acquired pneumonia) 12/05/2014    Past Surgical History:  Procedure Laterality Date  . ANTERIOR APPROACH HEMI HIP ARTHROPLASTY Left 06/20/2016   Procedure: ANTERIOR APPROACH HEMI HIP ARTHROPLASTY;  Surgeon: Samson Frederic, MD;  Location: [facility]  ORS;  Service: Orthopedics;  Laterality: Left;    OB History    No data available       Home Medications    Prior to Admission medications   Medication Sig Start Date End Date Taking? Authorizing Provider  acetaminophen (TYLENOL) 325 MG tablet Take 650 mg by mouth 3 (three) times daily.    Yes Historical Provider, MD  aspirin EC 81 MG tablet Take 81 mg by mouth 2 (two) times daily with a meal. After 08/03/16  continue taking once daily for 2 weeks   Yes Historical Provider, MD  bisacodyl (DULCOLAX) 5 MG EC tablet Take 1 tablet (5 mg total) by mouth daily as needed for moderate constipation. 06/22/16  Yes Joseph Art, DO  Cholecalciferol (D3-1000) 1000 units tablet Take 2,000 Units by mouth daily. 2 tablets   Yes Historical Provider, MD  GLUCERNA (GLUCERNA) LIQD Take 237 mLs by mouth 2 (two) times daily between meals.   Yes Historical Provider, MD  amoxicillin (AMOXIL) 500 MG capsule Take 1 capsule (500 mg total) by mouth 3 (three) times daily. 07/03/16   Elson Areas, PA-C  mupirocin ointment (BACTROBAN) 2 % Apply 1 application topically daily. Apply to left arm    Historical Provider, MD    Family History No family history on file.  Social History Social History  Substance Use Topics  . Smoking status: Never Smoker  . Smokeless tobacco: Never Used  .  Alcohol use No     Allergies   Doxycycline; Robitussin (alcohol free) [guaifenesin]; Septra [sulfamethoxazole-trimethoprim]; and Sulfa antibiotics   Review of Systems Review of Systems  All other systems reviewed and are negative.    Physical Exam Updated Vital Signs BP 181/74 (BP Location: Right Arm)   Pulse 78   Temp 97.7 F (36.5 C) (Oral)   Resp 15   SpO2 99%   Physical Exam  Constitutional: She appears well-developed and well-nourished. No distress.  HENT:  Head: Normocephalic and atraumatic.  Right Ear: External ear normal.  Left Ear: External ear normal.  Nose: Nose normal.  Mouth/Throat: Oropharynx  is clear and moist.  Eyes: Conjunctivae are normal.  Neck: Neck supple.  Cardiovascular: Normal rate and regular rhythm.   No murmur heard. Pulmonary/Chest: Effort normal and breath sounds normal. No respiratory distress.  Abdominal: Soft. There is no tenderness.  Musculoskeletal: She exhibits no edema.  Neurological: She is alert.  Skin: Skin is warm and dry.  1cm laceration chin,  7mm laceration face,  1cm laceration inner left lower lip  Psychiatric: She has a normal mood and affect.  Nursing note and vitals reviewed.    ED Treatments / Results  Labs (all labs ordered are listed, but only abnormal results are displayed) Labs Reviewed - No data to display  EKG  EKG Interpretation None       Radiology Ct Head Wo Contrast  Result Date: 07/03/2016 CLINICAL DATA:  Unwitnessed fall 0400 hrs at nursing center, 1 inch laceration to chin, injury to right forehead above her eye, hx of dementia, HTN and diabetes, no prev ct's of head nor face EXAM: CT HEAD WITHOUT CONTRAST CT MAXILLOFACIAL WITHOUT CONTRAST TECHNIQUE: Multidetector CT imaging of the head and maxillofacial structures were performed using the standard protocol without intravenous contrast. Multiplanar CT image reconstructions of the maxillofacial structures were also generated. COMPARISON:  None. FINDINGS: CT HEAD FINDINGS Brain: The ventricles are normal in configuration. There is ventricular and sulcal enlargement reflecting moderate atrophy. There are no parenchymal masses or mass effect. There is no evidence of a cortical infarct. Patchy white matter hypoattenuation is noted consistent with moderate chronic microvascular ischemic change. There are no extra-axial masses or abnormal fluid collections. There is no intracranial hemorrhage. Vascular: No hyperdense vessel or unexpected calcification. Skull: Normal. Negative for fracture or focal lesion. Sinuses/Orbits: No acute finding. Other: None CT MAXILLOFACIAL FINDINGS  Osseous: No fracture or mandibular dislocation. No destructive process. Orbits: Negative. No traumatic or inflammatory finding. Sinuses: Clear sinuses, mastoid air cells and middle ear cavities. Soft tissues: No significant contusion and no hematoma. No radiopaque foreign body. IMPRESSION: HEAD CT: No acute intracranial abnormalities. Atrophy and chronic microvascular ischemic change. MAXILLOFACIAL CT:  No fracture or acute abnormality. Electronically Signed   By: Amie Portland M.D.   On: 07/03/2016 07:43   Ct Maxillofacial Wo Contrast  Result Date: 07/03/2016 CLINICAL DATA:  Unwitnessed fall 0400 hrs at nursing center, 1 inch laceration to chin, injury to right forehead above her eye, hx of dementia, HTN and diabetes, no prev ct's of head nor face EXAM: CT HEAD WITHOUT CONTRAST CT MAXILLOFACIAL WITHOUT CONTRAST TECHNIQUE: Multidetector CT imaging of the head and maxillofacial structures were performed using the standard protocol without intravenous contrast. Multiplanar CT image reconstructions of the maxillofacial structures were also generated. COMPARISON:  None. FINDINGS: CT HEAD FINDINGS Brain: The ventricles are normal in configuration. There is ventricular and sulcal enlargement reflecting moderate atrophy. There are no parenchymal masses or mass  effect. There is no evidence of a cortical infarct. Patchy white matter hypoattenuation is noted consistent with moderate chronic microvascular ischemic change. There are no extra-axial masses or abnormal fluid collections. There is no intracranial hemorrhage. Vascular: No hyperdense vessel or unexpected calcification. Skull: Normal. Negative for fracture or focal lesion. Sinuses/Orbits: No acute finding. Other: None CT MAXILLOFACIAL FINDINGS Osseous: No fracture or mandibular dislocation. No destructive process. Orbits: Negative. No traumatic or inflammatory finding. Sinuses: Clear sinuses, mastoid air cells and middle ear cavities. Soft tissues: No significant  contusion and no hematoma. No radiopaque foreign body. IMPRESSION: HEAD CT: No acute intracranial abnormalities. Atrophy and chronic microvascular ischemic change. MAXILLOFACIAL CT:  No fracture or acute abnormality. Electronically Signed   By: Amie Portlandavid  Ormond M.D.   On: 07/03/2016 07:43    Procedures .Marland Kitchen.Laceration Repair Date/Time: 07/03/2016 11:31 AM Performed by: Elson AreasSOFIA, [place] Kretz K Authorized by: Elson AreasSOFIA, Hendrix Yurkovich K   Consent:    Consent obtained:  Verbal   Consent given by:  Patient   Risks discussed:  Infection Anesthesia (see MAR for exact dosages):    Anesthesia method:  None Laceration details:    Location:  Lip   Lip location:  Lower exterior lip   Length (cm):  2.7 Repair type:    Repair type:  Simple Treatment:    Area cleansed with:  Betadine   Irrigation method:  Pressure wash   Visualized foreign bodies/material removed: no   Skin repair:    Repair method:  Tissue adhesive Approximation:    Approximation:  Close Post-procedure details:    Patient tolerance of procedure:  Tolerated well, no immediate complications Comments:     1cm laceration inside mouth,  7mm laceration lower face.  1 cm laceration below chin.   Dermabond to skin wounds,  No sutures to inner mouth   (including critical care time)  Medications Ordered in ED Medications - No data to display   Initial Impression / Assessment and Plan / ED Course  I have reviewed the triage vital signs and the nursing notes.  Pertinent labs & imaging results that were available during my care of the patient were reviewed by me and considered in my medical decision making (see chart for details).  Clinical Course    Daughter plans to start staying with pt at night to make sure she is safe until she goes home.  I will treat with amoxicilian due to oral laceration.  Final Clinical Impressions(s) / ED Diagnoses   Final diagnoses:  Chin laceration, initial encounter    New Prescriptions New Prescriptions    AMOXICILLIN (AMOXIL) 500 MG CAPSULE    Take 1 capsule (500 mg total) by mouth 3 (three) times daily.    An After Visit Summary was printed and given to the patient. Elson AreasLeslie K Janus Vlcek, PA-C 07/03/16 1134    Lonia SkinnerLeslie K LadsonSofia, [place] JerseyPA-C 07/03/16 1136    Derwood KaplanAnkit Nanavati, MD 07/06/16 2350

## 2016-07-03 NOTE — ED Notes (Signed)
Dermabond at bedside for provider 

## 2016-07-06 ENCOUNTER — Telehealth: Payer: Self-pay | Admitting: Podiatry

## 2016-07-06 NOTE — Telephone Encounter (Signed)
Patient's niece [name] called saying that Nicole Blake is being discharged from Rehab Friday and wants to know if she can bring Nicole Blake in that day to pick up her diabetic shoes instead of waiting to bring her back on Oct. 25 when she already has an appointment for diabetic foot and nail care with Dr. Leeanne Deeduchman.

## 2016-07-07 DIAGNOSIS — S72092A Other fracture of head and neck of left femur, initial encounter for closed fracture: Secondary | ICD-10-CM | POA: Diagnosis not present

## 2016-07-08 ENCOUNTER — Ambulatory Visit (INDEPENDENT_AMBULATORY_CARE_PROVIDER_SITE_OTHER): Payer: Medicare Other | Admitting: Podiatry

## 2016-07-08 ENCOUNTER — Encounter: Payer: Self-pay | Admitting: Podiatry

## 2016-07-08 VITALS — BP 154/88 | HR 78

## 2016-07-08 DIAGNOSIS — M79675 Pain in left toe(s): Secondary | ICD-10-CM

## 2016-07-08 DIAGNOSIS — M79674 Pain in right toe(s): Secondary | ICD-10-CM

## 2016-07-08 DIAGNOSIS — B351 Tinea unguium: Secondary | ICD-10-CM

## 2016-07-08 NOTE — Patient Instructions (Addendum)
The diabetic shoes were too small today we will order a larger size notify you when they arrived  Diabetes and Foot Care Diabetes may cause you to have problems because of poor blood supply (circulation) to your feet and legs. This may cause the skin on your feet to become thinner, break easier, and heal more slowly. Your skin may become dry, and the skin may peel and crack. You may also have nerve damage in your legs and feet causing decreased feeling in them. You may not notice minor injuries to your feet that could lead to infections or more serious problems. Taking care of your feet is one of the most important things you can do for yourself.  HOME CARE INSTRUCTIONS  Wear shoes at all times, even in the house. Do not go barefoot. Bare feet are easily injured.  Check your feet daily for blisters, cuts, and redness. If you cannot see the bottom of your feet, use a mirror or ask someone for help.  Wash your feet with warm water (do not use hot water) and mild soap. Then pat your feet and the areas between your toes until they are completely dry. Do not soak your feet as this can dry your skin.  Apply a moisturizing lotion or petroleum jelly (that does not contain alcohol and is unscented) to the skin on your feet and to dry, brittle toenails. Do not apply lotion between your toes.  Trim your toenails straight across. Do not dig under them or around the cuticle. File the edges of your nails with an emery board or nail file.  Do not cut corns or calluses or try to remove them with medicine.  Wear clean socks or stockings every day. Make sure they are not too tight. Do not wear knee-high stockings since they may decrease blood flow to your legs.  Wear shoes that fit properly and have enough cushioning. To break in new shoes, wear them for just a few hours a day. This prevents you from injuring your feet. Always look in your shoes before you put them on to be sure there are no objects  inside.  Do not cross your legs. This may decrease the blood flow to your feet.  If you find a minor scrape, cut, or break in the skin on your feet, keep it and the skin around it clean and dry. These areas may be cleansed with mild soap and water. Do not cleanse the area with peroxide, alcohol, or iodine.  When you remove an adhesive bandage, be sure not to damage the skin around it.  If you have a wound, look at it several times a day to make sure it is healing.  Do not use heating pads or hot water bottles. They may burn your skin. If you have lost feeling in your feet or legs, you may not know it is happening until it is too late.  Make sure your health care provider performs a complete foot exam at least annually or more often if you have foot problems. Report any cuts, sores, or bruises to your health care provider immediately. SEEK MEDICAL CARE IF:   You have an injury that is not healing.  You have cuts or breaks in the skin.  You have an ingrown nail.  You notice redness on your legs or feet.  You feel burning or tingling in your legs or feet.  You have pain or cramps in your legs and feet.  Your legs or feet  are numb.  Your feet always feel cold. SEEK IMMEDIATE MEDICAL CARE IF:   There is increasing redness, swelling, or pain in or around a wound.  There is a red line that goes up your leg.  Pus is coming from a wound.  You develop a fever or as directed by your health care provider.  You notice a bad smell coming from an ulcer or wound.   This information is not intended to replace advice given to you by your health care provider. Make sure you discuss any questions you have with your health care provider.   Document Released: 09/11/2000 Document Revised: 05/17/2013 Document Reviewed: 02/21/2013 Elsevier Interactive Patient Education [employer].

## 2016-07-09 ENCOUNTER — Encounter: Payer: Self-pay | Admitting: Internal Medicine

## 2016-07-09 ENCOUNTER — Non-Acute Institutional Stay (SKILLED_NURSING_FACILITY): Payer: Medicare Other | Admitting: Internal Medicine

## 2016-07-09 DIAGNOSIS — D62 Acute posthemorrhagic anemia: Secondary | ICD-10-CM | POA: Diagnosis not present

## 2016-07-09 DIAGNOSIS — I10 Essential (primary) hypertension: Secondary | ICD-10-CM | POA: Diagnosis not present

## 2016-07-09 DIAGNOSIS — E119 Type 2 diabetes mellitus without complications: Secondary | ICD-10-CM | POA: Diagnosis not present

## 2016-07-09 DIAGNOSIS — E440 Moderate protein-calorie malnutrition: Secondary | ICD-10-CM | POA: Diagnosis not present

## 2016-07-09 DIAGNOSIS — F0280 Dementia in other diseases classified elsewhere without behavioral disturbance: Secondary | ICD-10-CM | POA: Diagnosis not present

## 2016-07-09 DIAGNOSIS — S51812D Laceration without foreign body of left forearm, subsequent encounter: Secondary | ICD-10-CM

## 2016-07-09 DIAGNOSIS — G309 Alzheimer's disease, unspecified: Secondary | ICD-10-CM | POA: Diagnosis not present

## 2016-07-09 DIAGNOSIS — S72002A Fracture of unspecified part of neck of left femur, initial encounter for closed fracture: Secondary | ICD-10-CM | POA: Diagnosis not present

## 2016-07-09 NOTE — Progress Notes (Signed)
Location:  [facility] Room Number: 101P Place of Service:  SNF (31) Randon Goldsmith. Lyn Hollingshead, MD  PCP:  Duane Lope, MD Patient Care Team: Gildardo Cranker, MD as PCP - General (Family Medicine) Gildardo Cranker, MD [facility] Medicine)  Extended Emergency Contact Information Primary Emergency Contact: [facility] Address: [address]          [place], [place] [address] Darden [place] of [place] Home Phone: [phone] Mobile Phone: [phone] Relation: Niece  Allergies  Allergen Reactions  . Doxycycline Other (See Comments)    Gi- Upset  . Robitussin (Alcohol Free) [Guaifenesin] Other (See Comments)    Stomach upset  . Septra [Sulfamethoxazole-Trimethoprim] Other (See Comments)    Gi- upset  . Sulfa Antibiotics     pts niece believes she may not be able to take this    Chief Complaint  Patient presents with  . Discharge Note    Discharged from SNF    HPI:  82 y.o. female  with medical history significant of HTN, diet-controled DM, dementia, who presents with left hip pain after fall. pt had fall 3 weeks ago. Had a negative knee x-ray through [facility] and  was initially diagnosed with a knee ligament injury, but the patient has been unable to put weight on her left leg due to pain since her injury. Pt was seen at [facility] orthopaedic today and was diagnosed with a left hip fracture. Pt was admitted to [facility] from 9/22-25 where she underwent a repair.Pt had post -op anemia but I don't see she had a transfusion. Pt was admitted to SNF for OT/PT and is noe ready to be d/c to home.    Past Medical History:  Diagnosis Date  . Dementia   . Diabetes mellitus without complication (HCC)   . Hypertension     Past Surgical History:  Procedure Laterality Date  . ANTERIOR APPROACH HEMI HIP ARTHROPLASTY Left 06/20/2016   Procedure: ANTERIOR APPROACH HEMI HIP ARTHROPLASTY;  Surgeon: Samson Frederic, MD;  Location: [facility] ORS;  Service: Orthopedics;  Laterality: Left;     reports that she has never smoked. She has never used smokeless tobacco. She reports that she does not drink alcohol. Her drug history is not on file. Social History   Social History  . Marital status: Widowed    Spouse name: N/A  . Number of children: N/A  . Years of education: N/A   Occupational History  . Not on file.   Social History Main Topics  . Smoking status: Never Smoker  . Smokeless tobacco: Never Used  . Alcohol use No  . Drug use: Unknown  . Sexual activity: Not on file   Other Topics Concern  . Not on file   Social History Narrative  . No narrative on file    Pertinent  Health Maintenance Due  Topic Date Due  . FOOT EXAM  05/01/1925  . OPHTHALMOLOGY EXAM  [birth year]01/[birth year]27  . URINE MICROALBUMIN  [birth year]01/[birth year]27  . DEXA SCAN  04/30/1982  . PNA vac Low Risk Adult (1 of 2 - PCV13) 04/30/1982  . INFLUENZA VACCINE  04/28/2016  . HEMOGLOBIN A1C  12/17/2016    Medications:   Medication List       Accurate as of 07/09/16  8:35 AM. Always use your most recent med list.          acetaminophen 325 MG tablet Commonly known as:  TYLENOL Take 650 mg by mouth 3 (three) times daily.   aspirin EC 81 MG tablet  Take 81 mg by mouth 2 (two) times daily with a meal. After 08/03/16  continue taking once daily for 2 weeks   bisacodyl 5 MG EC tablet Commonly known as:  DULCOLAX Take 1 tablet (5 mg total) by mouth daily as needed for moderate constipation.   D3-1000 1000 units tablet Generic drug:  Cholecalciferol Take 2,000 Units by mouth daily. 2 tablets   GLUCERNA Liqd Take 237 mLs by mouth 2 (two) times daily between meals.   mupirocin ointment 2 % Commonly known as:  BACTROBAN Apply 1 application topically daily. Apply to left arm        Vitals:   07/09/16 0830  BP: (!) 141/70  Pulse: 80  Resp: 20  Temp: 99 F (37.2 C)  Weight: 128 lb 12.8 oz (58.4 kg)  Height: 5\' 3"  (1.6 m)   Body mass index is 22.82 kg/m.  Physical Exam  GENERAL APPEARANCE: Alert,  conversant. No acute distress.  HEENT: Unremarkable. RESPIRATORY: Breathing is even, unlabored. Lung sounds are clear   CARDIOVASCULAR: Heart RRR no murmurs, rubs or gallops. No peripheral edema.  GASTROINTESTINAL: Abdomen is soft, non-tender, not distended w/ normal bowel sounds.  NEUROLOGIC: Cranial nerves 2-12 grossly intact. Moves all extremities   Labs reviewed: Basic Metabolic Panel:  Recent Labs  91/47/82 1727  06/21/16 0418 06/22/16 06/22/16 0425 06/29/16  NA 139  < > 138 141 141 134*  K 4.1  < > 4.4  --  3.8 4.3  CL 104  --  107  --  113*  --   CO2 25  --  23  --  23  --   GLUCOSE 146*  --  123*  --  92  --   BUN 23*  < > 26* 24* 24* 21  CREATININE 1.02*  < > 1.15* 1.0 1.01* 1.0  CALCIUM 9.4  --  8.3*  --  8.2*  --   < > = values in this interval not displayed. No results found for: [facility] Liver Function Tests:  Recent Labs  12/19/15 2223 06/21/16 0418  AST 28 18  ALT 15 11*  ALKPHOS 50 68  BILITOT 0.4 0.3  PROT 5.9* 5.7*  ALBUMIN 3.3* 2.8*   No results for input(s): LIPASE, AMYLASE in the last 8760 hours. No results for input(s): AMMONIA in the last 8760 hours. CBC:  Recent Labs  12/19/15 2223  06/19/16 1830  06/21/16 0418 06/22/16 06/22/16 0425 06/29/16  WBC 6.2  < > 10.5  < > 10.5 9.1 9.1 7.9  NEUTROABS 4.3  --  7.8*  --   --   --   --   --   HGB 12.7  < > 13.0  < > 10.5*  --  9.9* 9.8*  HCT 39.2  < > 40.1  < > 31.8*  --  30.6* 31*  MCV 89.1  --  88.7  --  87.4  --  89.2  --   PLT 186  < > 282  < > 221  --  209 243  < > = values in this interval not displayed. Lipid No results for input(s): CHOL, HDL, LDLCALC, TRIG in the last 8760 hours. Cardiac Enzymes: No results for input(s): CKTOTAL, CKMB, CKMBINDEX, TROPONINI in the last 8760 hours. BNP: No results for input(s): BNP in the last 8760 hours. CBG:  Recent Labs  06/21/16 2143 06/22/16 0758 06/22/16 1229  GLUCAP 133* 84 155*    Procedures and Imaging Studies During Stay: Dg  Chest 1 View  Result Date: 06/19/2016 CLINICAL DATA:  Larey Seat 3 weeks ago.  Right hip pain. EXAM: CHEST 1 VIEW COMPARISON:  12/19/2015 FINDINGS: The heart is normal in size. Stable marked tortuosity and ectasia of the thoracic aorta. The lungs are clear. No pleural effusion. Prominent skin fold noted over the left chest. The bony thorax is intact. No obvious rib fractures. IMPRESSION: No acute cardiopulmonary findings. Electronically Signed   By: Rudie Meyer M.D.   On: 06/19/2016 19:09   Ct Head Wo Contrast  Result Date: 07/03/2016 CLINICAL DATA:  Unwitnessed fall 0400 hrs at nursing center, 1 inch laceration to chin, injury to right forehead above her eye, hx of dementia, HTN and diabetes, no prev ct's of head nor face EXAM: CT HEAD WITHOUT CONTRAST CT MAXILLOFACIAL WITHOUT CONTRAST TECHNIQUE: Multidetector CT imaging of the head and maxillofacial structures were performed using the standard protocol without intravenous contrast. Multiplanar CT image reconstructions of the maxillofacial structures were also generated. COMPARISON:  None. FINDINGS: CT HEAD FINDINGS Brain: The ventricles are normal in configuration. There is ventricular and sulcal enlargement reflecting moderate atrophy. There are no parenchymal masses or mass effect. There is no evidence of a cortical infarct. Patchy white matter hypoattenuation is noted consistent with moderate chronic microvascular ischemic change. There are no extra-axial masses or abnormal fluid collections. There is no intracranial hemorrhage. Vascular: No hyperdense vessel or unexpected calcification. Skull: Normal. Negative for fracture or focal lesion. Sinuses/Orbits: No acute finding. Other: None CT MAXILLOFACIAL FINDINGS Osseous: No fracture or mandibular dislocation. No destructive process. Orbits: Negative. No traumatic or inflammatory finding. Sinuses: Clear sinuses, mastoid air cells and middle ear cavities. Soft tissues: No significant contusion and no hematoma.  No radiopaque foreign body. IMPRESSION: HEAD CT: No acute intracranial abnormalities. Atrophy and chronic microvascular ischemic change. MAXILLOFACIAL CT:  No fracture or acute abnormality. Electronically Signed   By: Amie Portland M.D.   On: 07/03/2016 07:43   Pelvis Portable  Result Date: 06/20/2016 CLINICAL DATA:  Left hip replacement. EXAM: PORTABLE PELVIS 1-2 VIEWS COMPARISON:  Earlier today.  Pelvis CT dated 12/05/2014. FINDINGS: Left hip bipolar prosthesis in satisfactory position and alignment. No fracture dislocation seen. Diffuse osteopenia. Lower thoracic spine degenerative changes. Uterine fibroid calcifications in the right pelvis. IMPRESSION: Satisfactory postoperative appearance of a left hip prosthesis. Electronically Signed   By: Beckie Salts M.D.   On: 06/20/2016 15:17   Dg C-arm 1-60 Min-no Report  Result Date: 06/20/2016 CLINICAL DATA: surgery C-ARM 1-60 MINUTES Fluoroscopy was utilized by the requesting physician.  No radiographic interpretation.   Dg Hip Operative Unilat W Or W/o Pelvis Left  Result Date: 06/20/2016 CLINICAL DATA:  Hemiarthroplasty EXAM: OPERATIVE LEFT HIP (WITH PELVIS IF PERFORMED) 3 VIEWS TECHNIQUE: Fluoroscopic spot image(s) were submitted for interpretation post-operatively. COMPARISON:  Preop 06/19/2016 FINDINGS: Findings document interval left hip arthroplasty, components project in expected location without fracture or dislocation. IMPRESSION: Left hip arthroplasty Electronically Signed   By: Corlis Leak M.D.   On: 06/20/2016 14:53   Dg Hip Unilat W Or Wo Pelvis 2-3 Views Left  Result Date: 06/19/2016 CLINICAL DATA:  Larey Seat 3 weeks ago.  Injured left hip. EXAM: DG HIP (WITH OR WITHOUT PELVIS) 2-3V LEFT COMPARISON:  None. FINDINGS: Difficult examination due to the position of the patient. There is a displaced left femoral neck fracture. The right hip is intact. The bony pelvis is grossly intact. IMPRESSION: Displaced left femoral neck fracture. Electronically  Signed   By: Rudie Meyer M.D.   On: 06/19/2016 19:10  Ct Maxillofacial Wo Contrast  Result Date: 07/03/2016 CLINICAL DATA:  Unwitnessed fall 0400 hrs at nursing center, 1 inch laceration to chin, injury to right forehead above her eye, hx of dementia, HTN and diabetes, no prev ct's of head nor face EXAM: CT HEAD WITHOUT CONTRAST CT MAXILLOFACIAL WITHOUT CONTRAST TECHNIQUE: Multidetector CT imaging of the head and maxillofacial structures were performed using the standard protocol without intravenous contrast. Multiplanar CT image reconstructions of the maxillofacial structures were also generated. COMPARISON:  None. FINDINGS: CT HEAD FINDINGS Brain: The ventricles are normal in configuration. There is ventricular and sulcal enlargement reflecting moderate atrophy. There are no parenchymal masses or mass effect. There is no evidence of a cortical infarct. Patchy white matter hypoattenuation is noted consistent with moderate chronic microvascular ischemic change. There are no extra-axial masses or abnormal fluid collections. There is no intracranial hemorrhage. Vascular: No hyperdense vessel or unexpected calcification. Skull: Normal. Negative for fracture or focal lesion. Sinuses/Orbits: No acute finding. Other: None CT MAXILLOFACIAL FINDINGS Osseous: No fracture or mandibular dislocation. No destructive process. Orbits: Negative. No traumatic or inflammatory finding. Sinuses: Clear sinuses, mastoid air cells and middle ear cavities. Soft tissues: No significant contusion and no hematoma. No radiopaque foreign body. IMPRESSION: HEAD CT: No acute intracranial abnormalities. Atrophy and chronic microvascular ischemic change. MAXILLOFACIAL CT:  No fracture or acute abnormality. Electronically Signed   By: Amie Portlandavid  Ormond M.D.   On: 07/03/2016 07:43    Assessment/Plan:   No diagnosis found.   Patient is being discharged with the following home health services:  HH/OT/PT/Nursing   Patient is being discharged  with the following durable medical equipment: Rolling walker   Patient has been advised to f/u with their PCP in 1-2 weeks to bring them up to date on their rehab stay.  Social services at facility was responsible for arranging this appointment.  Pt was provided with a 30 day supply of prescriptions for medications and refills must be obtained from their PCP.  For controlled substances, a more limited supply may be provided adequate until PCP appointment only.   Time spent . 30 min;> 50% of time with patient was spent reviewing records, labs, tests and studies, counseling and developing plan of care  Randon Goldsmithnne D. Lyn HollingsheadAlexander, MD

## 2016-07-09 NOTE — Progress Notes (Signed)
Patient ID: Nicole Blake, female   DOB: 11/28/1914, 82 y.o.   MRN: [id number]   Subjective: This patient presents today with her niece present in the treatment room for dispensing of diabetic shoes with custom insoles as well as requesting debridement of the toenails which she said her uncomfortable she wears: Shoes. Patient has a history of hip replacement resulting in an edematous left lower extremity  Patient is nonresponsive to questioning DP and PT pulses 1/4 bilaterally Peripheral peripheral edema left lower extremity No open skin lesions bilaterally The toenails are hypertrophic, elongated, incurvated and tender to direct palpation HAV deformities bilaterally Hammertoe 2-5 bilaterally Ortho Feet 7.5 wide on satisfactory fit are too small  Assessment: Symptomatic onychomycoses 6-10 Satisfactory fit of diabetic shoes, too small Postoperative edema left lower extremity Diabetic  Plan: Debridement of toenails 6-10 mechanically an electrical without any bleeding Reorder diabetic shoes size a wide and notify patient upon receipt of diabetic shoes  Reappoint 3 months for nail debridement

## 2016-07-13 DIAGNOSIS — M6281 Muscle weakness (generalized): Secondary | ICD-10-CM | POA: Diagnosis not present

## 2016-07-13 DIAGNOSIS — R350 Frequency of micturition: Secondary | ICD-10-CM | POA: Diagnosis not present

## 2016-07-15 DIAGNOSIS — E119 Type 2 diabetes mellitus without complications: Secondary | ICD-10-CM | POA: Diagnosis not present

## 2016-07-15 DIAGNOSIS — W19XXXD Unspecified fall, subsequent encounter: Secondary | ICD-10-CM | POA: Diagnosis not present

## 2016-07-15 DIAGNOSIS — Z96642 Presence of left artificial hip joint: Secondary | ICD-10-CM | POA: Diagnosis not present

## 2016-07-15 DIAGNOSIS — I10 Essential (primary) hypertension: Secondary | ICD-10-CM | POA: Diagnosis not present

## 2016-07-15 DIAGNOSIS — R531 Weakness: Secondary | ICD-10-CM | POA: Diagnosis not present

## 2016-07-15 DIAGNOSIS — F0390 Unspecified dementia without behavioral disturbance: Secondary | ICD-10-CM | POA: Diagnosis not present

## 2016-07-15 DIAGNOSIS — S72002D Fracture of unspecified part of neck of left femur, subsequent encounter for closed fracture with routine healing: Secondary | ICD-10-CM | POA: Diagnosis not present

## 2016-07-17 DIAGNOSIS — I10 Essential (primary) hypertension: Secondary | ICD-10-CM | POA: Diagnosis not present

## 2016-07-17 DIAGNOSIS — Z96642 Presence of left artificial hip joint: Secondary | ICD-10-CM | POA: Diagnosis not present

## 2016-07-17 DIAGNOSIS — S72002D Fracture of unspecified part of neck of left femur, subsequent encounter for closed fracture with routine healing: Secondary | ICD-10-CM | POA: Diagnosis not present

## 2016-07-17 DIAGNOSIS — W19XXXD Unspecified fall, subsequent encounter: Secondary | ICD-10-CM | POA: Diagnosis not present

## 2016-07-17 DIAGNOSIS — E119 Type 2 diabetes mellitus without complications: Secondary | ICD-10-CM | POA: Diagnosis not present

## 2016-07-17 DIAGNOSIS — F0390 Unspecified dementia without behavioral disturbance: Secondary | ICD-10-CM | POA: Diagnosis not present

## 2016-07-18 ENCOUNTER — Encounter: Payer: Self-pay | Admitting: Internal Medicine

## 2016-07-21 DIAGNOSIS — Z96642 Presence of left artificial hip joint: Secondary | ICD-10-CM | POA: Diagnosis not present

## 2016-07-21 DIAGNOSIS — W19XXXD Unspecified fall, subsequent encounter: Secondary | ICD-10-CM | POA: Diagnosis not present

## 2016-07-21 DIAGNOSIS — I10 Essential (primary) hypertension: Secondary | ICD-10-CM | POA: Diagnosis not present

## 2016-07-21 DIAGNOSIS — E119 Type 2 diabetes mellitus without complications: Secondary | ICD-10-CM | POA: Diagnosis not present

## 2016-07-21 DIAGNOSIS — S72002D Fracture of unspecified part of neck of left femur, subsequent encounter for closed fracture with routine healing: Secondary | ICD-10-CM | POA: Diagnosis not present

## 2016-07-21 DIAGNOSIS — F0390 Unspecified dementia without behavioral disturbance: Secondary | ICD-10-CM | POA: Diagnosis not present

## 2016-07-22 ENCOUNTER — Ambulatory Visit: Payer: Medicare Other | Admitting: Podiatry

## 2016-07-23 DIAGNOSIS — E119 Type 2 diabetes mellitus without complications: Secondary | ICD-10-CM | POA: Diagnosis not present

## 2016-07-23 DIAGNOSIS — F0390 Unspecified dementia without behavioral disturbance: Secondary | ICD-10-CM | POA: Diagnosis not present

## 2016-07-23 DIAGNOSIS — Z96642 Presence of left artificial hip joint: Secondary | ICD-10-CM | POA: Diagnosis not present

## 2016-07-23 DIAGNOSIS — S72002D Fracture of unspecified part of neck of left femur, subsequent encounter for closed fracture with routine healing: Secondary | ICD-10-CM | POA: Diagnosis not present

## 2016-07-23 DIAGNOSIS — W19XXXD Unspecified fall, subsequent encounter: Secondary | ICD-10-CM | POA: Diagnosis not present

## 2016-07-23 DIAGNOSIS — I10 Essential (primary) hypertension: Secondary | ICD-10-CM | POA: Diagnosis not present

## 2016-08-02 ENCOUNTER — Encounter: Payer: Self-pay | Admitting: Internal Medicine

## 2016-08-04 DIAGNOSIS — S72002D Fracture of unspecified part of neck of left femur, subsequent encounter for closed fracture with routine healing: Secondary | ICD-10-CM | POA: Diagnosis not present

## 2016-08-05 ENCOUNTER — Ambulatory Visit (INDEPENDENT_AMBULATORY_CARE_PROVIDER_SITE_OTHER): Payer: Medicare Other | Admitting: Podiatry

## 2016-08-05 DIAGNOSIS — E119 Type 2 diabetes mellitus without complications: Secondary | ICD-10-CM

## 2016-08-05 DIAGNOSIS — B351 Tinea unguium: Secondary | ICD-10-CM | POA: Diagnosis not present

## 2016-08-05 DIAGNOSIS — I7389 Other specified peripheral vascular diseases: Secondary | ICD-10-CM

## 2016-08-05 DIAGNOSIS — M2012 Hallux valgus (acquired), left foot: Secondary | ICD-10-CM | POA: Diagnosis not present

## 2016-08-05 DIAGNOSIS — R609 Edema, unspecified: Secondary | ICD-10-CM

## 2016-08-05 DIAGNOSIS — M79675 Pain in left toe(s): Secondary | ICD-10-CM

## 2016-08-05 DIAGNOSIS — M2011 Hallux valgus (acquired), right foot: Secondary | ICD-10-CM

## 2016-08-05 DIAGNOSIS — R0989 Other specified symptoms and signs involving the circulatory and respiratory systems: Secondary | ICD-10-CM

## 2016-08-05 DIAGNOSIS — M79674 Pain in right toe(s): Secondary | ICD-10-CM

## 2016-08-05 NOTE — Progress Notes (Signed)
      Subjective: This patient presents today with her niece present in the treatment room for dispensing of diabetic shoes with custom insoles as well as requesting debridement of the toenails which she said her uncomfortable she wears: Shoes. Patient has a history of hip replacement resulting in an edematous left lower extremity  Patient is nonresponsive to questioning DP and PT pulses 1/4 bilaterally Peripheral peripheral edema left lower extremity No open skin lesions bilaterally The toenails are hypertrophic, elongated, incurvated and tender to direct palpation HAV deformities bilaterally Hammertoe 2-5 bilaterally Ortho Feet 7.5 wide on satisfactory fit are too small  Assessment: Symptomatic onychomycoses 6-10 Satisfactory fit of diabetic shoes, too small Postoperative edema left lower extremity Diabetic  Plan: Debridement of toenails 6-10 mechanically an electrical without any bleeding Reorder diabetic shoes size a wide and notify patient upon receipt of diabetic shoes  Patient presents for diabetic shoe pick up, shoes are tried on for good fit, Ortho Feet he'll have a mild [place] stretch black Spandex 2 and 6 custom molded insoles.Marland Kitchen.and written break in and wear instructions given.  Reappoint 3 months    Reappoint 3 months for nail debridement

## 2016-08-05 NOTE — Patient Instructions (Signed)

## 2016-08-12 DIAGNOSIS — R531 Weakness: Secondary | ICD-10-CM | POA: Diagnosis not present

## 2016-10-02 DIAGNOSIS — E1151 Type 2 diabetes mellitus with diabetic peripheral angiopathy without gangrene: Secondary | ICD-10-CM | POA: Diagnosis not present

## 2016-10-02 DIAGNOSIS — L603 Nail dystrophy: Secondary | ICD-10-CM | POA: Diagnosis not present

## 2016-10-02 DIAGNOSIS — L84 Corns and callosities: Secondary | ICD-10-CM | POA: Diagnosis not present

## 2016-10-02 DIAGNOSIS — I739 Peripheral vascular disease, unspecified: Secondary | ICD-10-CM | POA: Diagnosis not present

## 2016-10-07 ENCOUNTER — Ambulatory Visit (INDEPENDENT_AMBULATORY_CARE_PROVIDER_SITE_OTHER): Payer: Self-pay

## 2016-10-07 ENCOUNTER — Ambulatory Visit: Payer: Medicare Other | Admitting: Podiatry

## 2016-10-07 DIAGNOSIS — M2012 Hallux valgus (acquired), left foot: Secondary | ICD-10-CM

## 2016-10-07 NOTE — Progress Notes (Signed)
Strap on shoes were causing patient pain, new shoes were picked out and other shoes are to be returned back to [facility]. Pt will be advised when shoes are ready and will need an appt to ensure proper fit

## 2016-10-19 DIAGNOSIS — R531 Weakness: Secondary | ICD-10-CM | POA: Diagnosis not present

## 2016-10-26 ENCOUNTER — Telehealth: Payer: Self-pay | Admitting: Podiatry

## 2016-10-26 NOTE — Telephone Encounter (Signed)
Patients POA is calling to see if patients corrected diabetic shoes are in. She was here and the shoes were to tight and were to be sent back

## 2016-10-30 NOTE — Telephone Encounter (Signed)
Shoes arrived today 10/30/16 okay to schedule with Shanda BumpsJessica this is a refit shoes previously billed so any day that is good for patient is okay

## 2016-11-03 ENCOUNTER — Ambulatory Visit: Payer: Medicare Other

## 2016-11-03 DIAGNOSIS — R0989 Other specified symptoms and signs involving the circulatory and respiratory systems: Secondary | ICD-10-CM

## 2016-11-03 DIAGNOSIS — R609 Edema, unspecified: Secondary | ICD-10-CM

## 2016-11-03 DIAGNOSIS — M2012 Hallux valgus (acquired), left foot: Secondary | ICD-10-CM

## 2016-11-03 NOTE — Patient Instructions (Signed)

## 2016-11-03 NOTE — Progress Notes (Signed)
Pt picked up diabetic shoes, instructions were dispensed, with satisfactory fit. These shoes were a re-do from previous shoes ordered. The previous shoes were too tight cross the dorsal aspect of the foot, so a different style was needed to accommodate for comfort.

## 2016-12-09 DIAGNOSIS — S72092D Other fracture of head and neck of left femur, subsequent encounter for closed fracture with routine healing: Secondary | ICD-10-CM | POA: Diagnosis not present

## 2016-12-28 DIAGNOSIS — I739 Peripheral vascular disease, unspecified: Secondary | ICD-10-CM | POA: Diagnosis not present

## 2016-12-28 DIAGNOSIS — E1151 Type 2 diabetes mellitus with diabetic peripheral angiopathy without gangrene: Secondary | ICD-10-CM | POA: Diagnosis not present

## 2016-12-28 DIAGNOSIS — L84 Corns and callosities: Secondary | ICD-10-CM | POA: Diagnosis not present

## 2016-12-28 DIAGNOSIS — L603 Nail dystrophy: Secondary | ICD-10-CM | POA: Diagnosis not present

## 2017-01-17 IMAGING — CR DG CHEST 1V
1 series · 1 of 1 positions shown · non-contrast
Comparison: 12/19/2015

CLINICAL DATA: Fell 3 weeks ago.  Right hip pain.

EXAM:
CHEST 1 VIEW

[x chest ap]
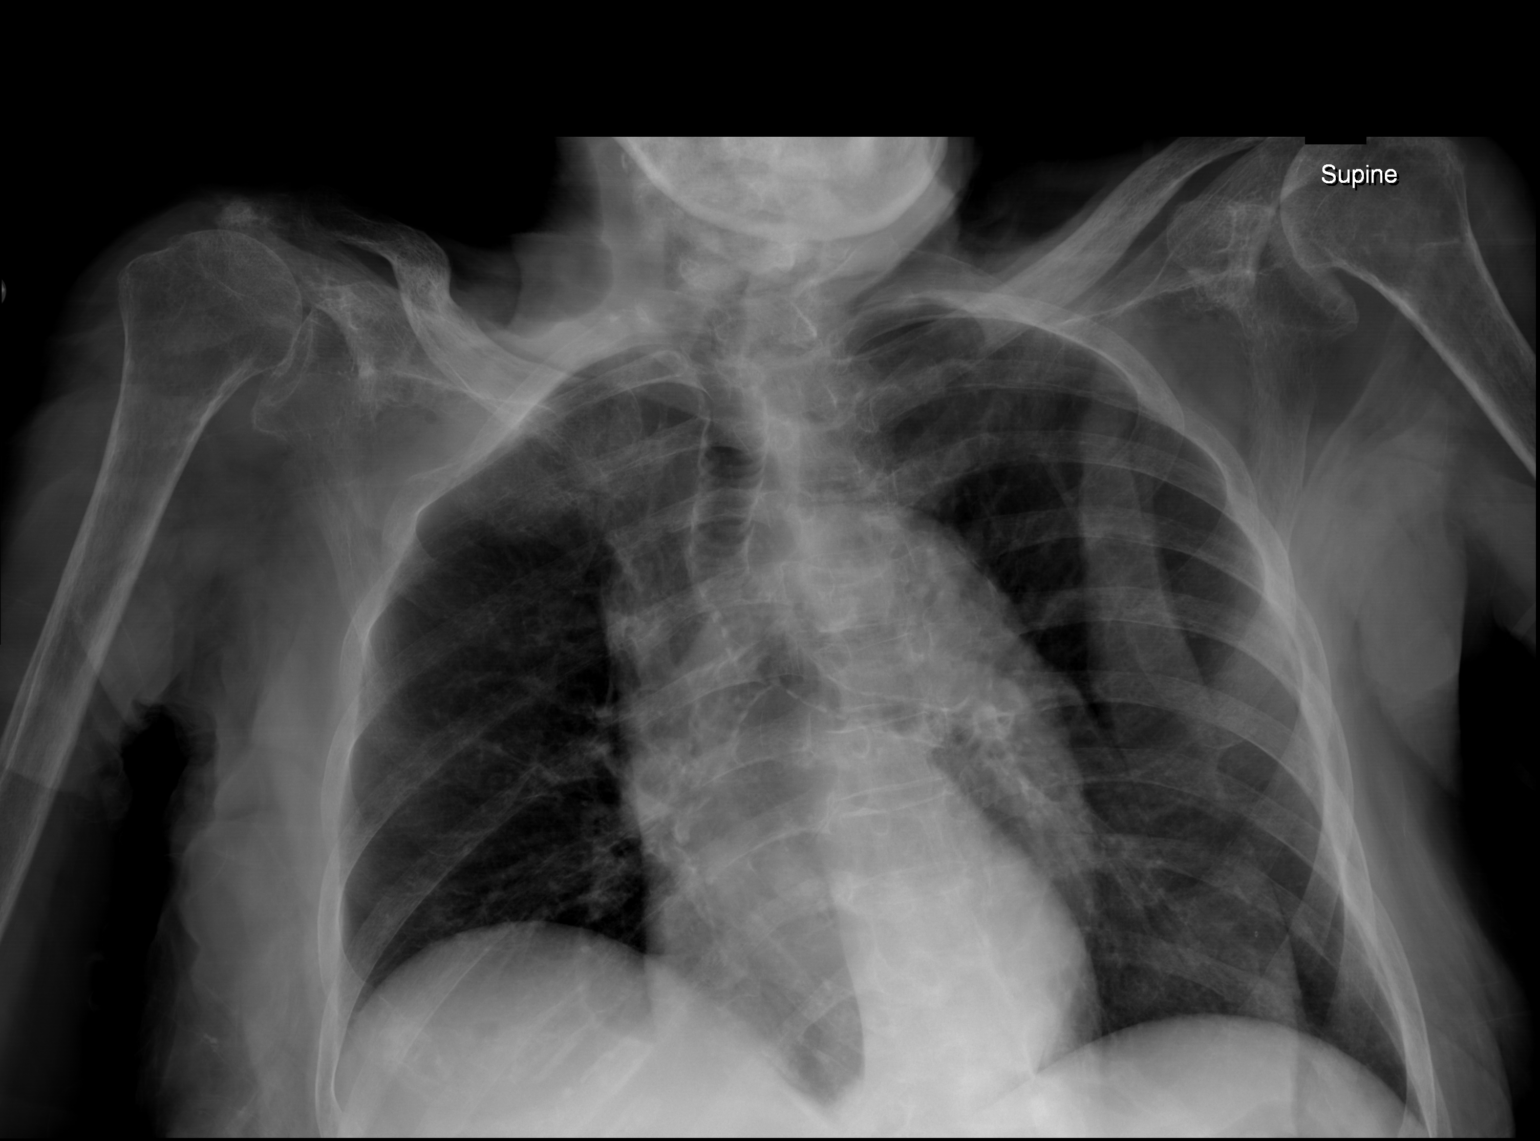

[1 of 1 positions shown; findings below may reference images not displayed]

FINDINGS: The heart is normal in size. Stable marked tortuosity and ectasia of
the thoracic aorta. The lungs are clear. No pleural effusion.
Prominent skin fold noted over the left chest. The bony thorax is
intact. No obvious rib fractures.
IMPRESSION: No acute cardiopulmonary findings.

## 2017-03-15 DIAGNOSIS — I739 Peripheral vascular disease, unspecified: Secondary | ICD-10-CM | POA: Diagnosis not present

## 2017-03-15 DIAGNOSIS — L603 Nail dystrophy: Secondary | ICD-10-CM | POA: Diagnosis not present

## 2017-03-15 DIAGNOSIS — L84 Corns and callosities: Secondary | ICD-10-CM | POA: Diagnosis not present

## 2017-03-15 DIAGNOSIS — E1151 Type 2 diabetes mellitus with diabetic peripheral angiopathy without gangrene: Secondary | ICD-10-CM | POA: Diagnosis not present

## 2017-03-29 DIAGNOSIS — H40013 Open angle with borderline findings, low risk, bilateral: Secondary | ICD-10-CM | POA: Diagnosis not present

## 2017-05-27 DIAGNOSIS — R531 Weakness: Secondary | ICD-10-CM | POA: Diagnosis not present

## 2017-06-28 ENCOUNTER — Emergency Department (HOSPITAL_COMMUNITY): Payer: Medicare Other

## 2017-06-28 ENCOUNTER — Inpatient Hospital Stay (HOSPITAL_COMMUNITY)
Admission: EM | Admit: 2017-06-28 | Discharge: 2017-07-06 | DRG: 871 | Disposition: A | Discharge status: Hospice | Payer: Medicare Other | Attending: Internal Medicine | Admitting: Internal Medicine

## 2017-06-28 ENCOUNTER — Encounter (HOSPITAL_COMMUNITY): Payer: Self-pay | Admitting: Emergency Medicine

## 2017-06-28 DIAGNOSIS — E43 Unspecified severe protein-calorie malnutrition: Secondary | ICD-10-CM | POA: Diagnosis not present

## 2017-06-28 DIAGNOSIS — A414 Sepsis due to anaerobes: Secondary | ICD-10-CM | POA: Diagnosis not present

## 2017-06-28 DIAGNOSIS — Z8701 Personal history of pneumonia (recurrent): Secondary | ICD-10-CM

## 2017-06-28 DIAGNOSIS — A0472 Enterocolitis due to Clostridium difficile, not specified as recurrent: Secondary | ICD-10-CM

## 2017-06-28 DIAGNOSIS — F0391 Unspecified dementia with behavioral disturbance: Secondary | ICD-10-CM | POA: Diagnosis not present

## 2017-06-28 DIAGNOSIS — R740 Nonspecific elevation of levels of transaminase and lactic acid dehydrogenase [LDH]: Secondary | ICD-10-CM | POA: Diagnosis present

## 2017-06-28 DIAGNOSIS — N179 Acute kidney failure, unspecified: Secondary | ICD-10-CM | POA: Diagnosis present

## 2017-06-28 DIAGNOSIS — E872 Acidosis, unspecified: Secondary | ICD-10-CM

## 2017-06-28 DIAGNOSIS — A419 Sepsis, unspecified organism: Secondary | ICD-10-CM

## 2017-06-28 DIAGNOSIS — Z888 Allergy status to other drugs, medicaments and biological substances status: Secondary | ICD-10-CM

## 2017-06-28 DIAGNOSIS — E860 Dehydration: Secondary | ICD-10-CM

## 2017-06-28 DIAGNOSIS — Z7951 Long term (current) use of inhaled steroids: Secondary | ICD-10-CM

## 2017-06-28 DIAGNOSIS — E876 Hypokalemia: Secondary | ICD-10-CM | POA: Diagnosis present

## 2017-06-28 DIAGNOSIS — I10 Essential (primary) hypertension: Secondary | ICD-10-CM | POA: Diagnosis present

## 2017-06-28 DIAGNOSIS — Z515 Encounter for palliative care: Secondary | ICD-10-CM

## 2017-06-28 DIAGNOSIS — R7989 Other specified abnormal findings of blood chemistry: Secondary | ICD-10-CM

## 2017-06-28 DIAGNOSIS — R0602 Shortness of breath: Secondary | ICD-10-CM | POA: Diagnosis not present

## 2017-06-28 DIAGNOSIS — R197 Diarrhea, unspecified: Secondary | ICD-10-CM | POA: Diagnosis not present

## 2017-06-28 DIAGNOSIS — Z6821 Body mass index (BMI) 21.0-21.9, adult: Secondary | ICD-10-CM

## 2017-06-28 DIAGNOSIS — F03918 Unspecified dementia, unspecified severity, with other behavioral disturbance: Secondary | ICD-10-CM | POA: Diagnosis present

## 2017-06-28 DIAGNOSIS — Z882 Allergy status to sulfonamides status: Secondary | ICD-10-CM

## 2017-06-28 DIAGNOSIS — Z66 Do not resuscitate: Secondary | ICD-10-CM | POA: Diagnosis present

## 2017-06-28 DIAGNOSIS — Z7189 Other specified counseling: Secondary | ICD-10-CM

## 2017-06-28 DIAGNOSIS — Z96642 Presence of left artificial hip joint: Secondary | ICD-10-CM | POA: Diagnosis present

## 2017-06-28 DIAGNOSIS — Z881 Allergy status to other antibiotic agents status: Secondary | ICD-10-CM

## 2017-06-28 DIAGNOSIS — E119 Type 2 diabetes mellitus without complications: Secondary | ICD-10-CM | POA: Diagnosis present

## 2017-06-28 LAB — CBC WITH DIFFERENTIAL/PLATELET
Basophils Absolute: 0 10*3/uL (ref 0.0–0.1)
Basophils Relative: 0 %
EOS PCT: 0 %
Eosinophils Absolute: 0 10*3/uL (ref 0.0–0.7)
HCT: 36.6 % (ref 36.0–46.0)
Hemoglobin: 11.8 g/dL — ABNORMAL LOW (ref 12.0–15.0)
LYMPHS ABS: 1 10*3/uL (ref 0.7–4.0)
LYMPHS PCT: 5 %
MCH: 28 pg (ref 26.0–34.0)
MCHC: 32.2 g/dL (ref 30.0–36.0)
MCV: 86.9 fL (ref 78.0–100.0)
MONOS PCT: 10 %
Monocytes Absolute: 1.9 10*3/uL — ABNORMAL HIGH (ref 0.1–1.0)
Neutro Abs: 15.8 10*3/uL — ABNORMAL HIGH (ref 1.7–7.7)
Neutrophils Relative %: 85 %
PLATELETS: 259 10*3/uL (ref 150–400)
RBC: 4.21 MIL/uL (ref 3.87–5.11)
RDW: 15.2 % (ref 11.5–15.5)
WBC: 18.7 10*3/uL — ABNORMAL HIGH (ref 4.0–10.5)

## 2017-06-28 LAB — COMPREHENSIVE METABOLIC PANEL
ALT: 10 U/L — AB (ref 14–54)
AST: 13 U/L — AB (ref 15–41)
Albumin: 2.4 g/dL — ABNORMAL LOW (ref 3.5–5.0)
Alkaline Phosphatase: 86 U/L (ref 38–126)
Anion gap: 12 (ref 5–15)
BUN: 63 mg/dL — AB (ref 6–20)
CHLORIDE: 111 mmol/L (ref 101–111)
CO2: 18 mmol/L — AB (ref 22–32)
Calcium: 8.3 mg/dL — ABNORMAL LOW (ref 8.9–10.3)
Creatinine, Ser: 2.09 mg/dL — ABNORMAL HIGH (ref 0.44–1.00)
GFR calc Af Amer: 21 mL/min — ABNORMAL LOW (ref >60)
GFR calc non Af Amer: 18 mL/min — ABNORMAL LOW (ref >60)
GLUCOSE: 272 mg/dL — AB (ref 65–99)
POTASSIUM: 4.5 mmol/L (ref 3.5–5.1)
Sodium: 141 mmol/L (ref 135–145)
Total Bilirubin: 0.3 mg/dL (ref 0.3–1.2)
Total Protein: 5.4 g/dL — ABNORMAL LOW (ref 6.5–8.1)

## 2017-06-28 LAB — LACTIC ACID, PLASMA: Lactic Acid, Venous: 2.9 mmol/L (ref 0.5–1.9)

## 2017-06-28 MED ORDER — SODIUM CHLORIDE 0.9 % IV BOLUS (SEPSIS)
1000.0000 mL | Freq: Once | INTRAVENOUS | Status: AC
  Administered 2017-06-28: 1000 mL via INTRAVENOUS

## 2017-06-28 MED ORDER — SODIUM CHLORIDE 0.9 % IV SOLN
INTRAVENOUS | Status: DC
  Administered 2017-06-29: 01:00:00 via INTRAVENOUS

## 2017-06-28 MED ORDER — LOPERAMIDE HCL 2 MG PO CAPS
4.0000 mg | ORAL_CAPSULE | Freq: Once | ORAL | Status: DC
  Filled 2017-06-28: qty 2

## 2017-06-28 NOTE — ED Provider Notes (Signed)
[facility]-EMERGENCY DEPT Provider Note   CSN: [id number] Arrival date & time: 06/28/17  2005     History   Chief Complaint Chief Complaint  Patient presents with  . Diarrhea  . Weakness    HPI Nicole Blake is a 83 y.o. female.  The history is provided by a relative. The history is limited by the condition of the patient (Dementia).  She had been visiting relatives in [place] when she started developing diarrhea about 8 days ago. She is having 3-4 watery bowel movements a day. There has been no fever, chills, sweats. There's been no nausea or vomiting. Appetite has been diminished. She has severe dementia, but mental status has not had any significant changes. There've been no known sick contacts. She has not been hospitalized recently and she has not been on antibiotics recently.  Past Medical History:  Diagnosis Date  . Dementia   . Diabetes mellitus without complication (HCC)   . Hypertension     Patient Active Problem List   Diagnosis Date Noted  . Postoperative anemia due to acute blood loss 07/01/2016  . Edema of left lower extremity 07/01/2016  . Left displaced femoral neck fracture (HCC) 06/20/2016  . Fall 06/19/2016  . Displaced fracture of left femoral neck (HCC) 06/19/2016  . Closed left hip fracture (HCC) 06/19/2016  . Skin tear of forearm without complication 06/19/2016  . Protein-calorie malnutrition, moderate (HCC) 06/19/2016  . Dementia without behavioral disturbance 06/19/2016  . Hypertension   . Diabetes mellitus without complication (HCC)   . Essential hypertension   . Aspiration pneumonia (HCC)   . Nausea and vomiting 12/05/2014  . Emesis 12/05/2014  . CAP (community acquired pneumonia) 12/05/2014    Past Surgical History:  Procedure Laterality Date  . ANTERIOR APPROACH HEMI HIP ARTHROPLASTY Left 06/20/2016   Procedure: ANTERIOR APPROACH HEMI HIP ARTHROPLASTY;  Surgeon: Samson Frederic, MD;  Location: [facility] ORS;  Service: Orthopedics;  Laterality:  Left;    OB History    No data available       Home Medications    Prior to Admission medications   Medication Sig Start Date End Date Taking? Authorizing Provider  acetaminophen (TYLENOL) 325 MG tablet Take 650 mg by mouth 3 (three) times daily.     [provider]  aspirin EC 81 MG tablet Take 81 mg by mouth 2 (two) times daily with a meal. After 07/17/16  continue taking once daily for 2 weeks    [provider]  bisacodyl (DULCOLAX) 5 MG EC tablet Take 1 tablet (5 mg total) by mouth daily as needed for moderate constipation. 06/22/16   Joseph Art, DO  Cholecalciferol (D3-1000) 1000 units tablet Take 2,000 Units by mouth daily. 2 tablets    [provider]  GLUCERNA (GLUCERNA) LIQD Take 237 mLs by mouth 2 (two) times daily between meals.    [provider]  mupirocin ointment (BACTROBAN) 2 % Apply 1 application topically daily. Apply to left arm    [provider]    Family History History reviewed. No pertinent family history.  Social History Social History  Substance Use Topics  . Smoking status: Never Smoker  . Smokeless tobacco: Never Used  . Alcohol use No     Allergies   Doxycycline; Robitussin (alcohol free) [guaifenesin]; Septra [sulfamethoxazole-trimethoprim]; and Sulfa antibiotics   Review of Systems Review of Systems  Unable to perform ROS: Dementia     Physical Exam Updated Vital Signs BP (!) 88/62 (BP Location: Right Arm)  Pulse 93   Temp (!) 97.2 F (36.2 C) (Axillary) Comment (Src): unable to get oral temp due to dementia  Resp 16   Ht  (1.549 m)   Wt 56.7 kg (125 lb)   SpO2 97%   BMI 23.62 kg/m   Physical Exam  Nursing note and vitals reviewed.  83 year old female, resting comfortably and in no acute distress. Vital signs are significant for hypotension, but repeat blood pressure is normal. Oxygen saturation is 97%, which is normal. Head is normocephalic and atraumatic. PERRLA, EOMI.  Oropharynx is clear. Eyes appear sunken. Because membranes are dry. Neck is nontender and supple without adenopathy or JVD. Back is nontender and there is no CVA tenderness. Lungs are clear without rales, wheezes, or rhonchi. Chest is nontender. Heart has regular rate and rhythm with 3/6 systolic ejection murmur best heard at the lower left sternal border. Abdomen is soft, flat, nontender without masses or hepatosplenomegaly and peristalsis is normoactive. Extremities have no cyanosis or edema, full range of motion is present. Skin is warm and dry without rash. Decreased skin turgor. Neurologic: She is awake but nonverbal and does not follow commands, cranial nerves are intact, there are no gross motor or sensory deficits.  ED Treatments / Results  Labs (all labs ordered are listed, but only abnormal results are displayed) Labs Reviewed  COMPREHENSIVE METABOLIC PANEL - Abnormal; Notable for the following:       Result Value   CO2 18 (*)    Glucose, Bld 272 (*)    BUN 63 (*)    Creatinine, Ser 2.09 (*)    Calcium 8.3 (*)    Total Protein 5.4 (*)    Albumin 2.4 (*)    AST 13 (*)    ALT 10 (*)    GFR calc non Af Amer 18 (*)    GFR calc Af Amer 21 (*)    All other components within normal limits  CBC WITH DIFFERENTIAL/PLATELET - Abnormal; Notable for the following:    WBC 18.7 (*)    Hemoglobin 11.8 (*)    Neutro Abs 15.8 (*)    Monocytes Absolute 1.9 (*)    All other components within normal limits  LACTIC ACID, PLASMA - Abnormal; Notable for the following:    Lactic Acid, Venous 2.9 (*)    All other components within normal limits  GASTROINTESTINAL PANEL BY PCR, STOOL (REPLACES STOOL CULTURE)  C DIFFICILE QUICK SCREEN W PCR REFLEX  URINALYSIS, ROUTINE W REFLEX MICROSCOPIC  LACTIC ACID, PLASMA    EKG  EKG Interpretation None       Radiology No results found.  Procedures Procedures (including critical care time)  Medications Ordered in ED Medications  0.9 %   sodium chloride infusion (not administered)  sodium chloride 0.9 % bolus 1,000 mL (1,000 mLs Intravenous New Bag/Given 06/28/17 2232)     Initial Impression / Assessment and Plan / ED Course  I have reviewed the triage vital signs and the nursing notes.  Pertinent labs & imaging results that were available during my care of the patient were reviewed by me and considered in my medical decision making (see chart for details).  Diarrhea with physical signs suggesting dehydration. She'll be given IV fluids. Screening labs obtained. Old records are reviewed, and she has no relevant past visits.  Laboratory evaluation does show evidence of acute kidney injury with creatinine 2.09-last value one year ago was 1.0. BUN is 63 compared with 21 suggesting a prerenal azotemia, consistent  with dehydration. Lactic acid level is mildly elevated. This is not from sepsis, but from dehydration. To this point, she has not provided a stool sample for testing. Case is discussed with Dr. Julian Reil of triad hospitalists who agrees to admit the patient.  Final Clinical Impressions(s) / ED Diagnoses   Final diagnoses:  Diarrhea of presumed infectious origin  Dehydration  Acute kidney injury (nontraumatic) (HCC)  Elevated lactic acid level    New Prescriptions New Prescriptions   No medications on file     Dione Booze, MD 06/28/17 2353

## 2017-06-28 NOTE — ED Triage Notes (Signed)
Pt family reports that pt has been having diarrhea for a week and has been having increasing weakness. Hx of dementia and on pallative care. Family reports foul smelling diarrhea and up to 4 episodes today.

## 2017-06-28 NOTE — ED Notes (Signed)
Pt presents to the ED with niece (power of attorney). Per pt's niece, she has been having watery, brown stool "with strong smell" for 1 week and lack of appetite. Denies being on a round of antibiotics recently. Pt is usually ambulatory, but recently has been unsteady on her feet.

## 2017-06-29 DIAGNOSIS — Z881 Allergy status to other antibiotic agents status: Secondary | ICD-10-CM | POA: Diagnosis not present

## 2017-06-29 DIAGNOSIS — E860 Dehydration: Secondary | ICD-10-CM

## 2017-06-29 DIAGNOSIS — Z888 Allergy status to other drugs, medicaments and biological substances status: Secondary | ICD-10-CM | POA: Diagnosis not present

## 2017-06-29 DIAGNOSIS — F0281 Dementia in other diseases classified elsewhere with behavioral disturbance: Secondary | ICD-10-CM | POA: Diagnosis not present

## 2017-06-29 DIAGNOSIS — Z882 Allergy status to sulfonamides status: Secondary | ICD-10-CM | POA: Diagnosis not present

## 2017-06-29 DIAGNOSIS — R1084 Generalized abdominal pain: Secondary | ICD-10-CM | POA: Diagnosis not present

## 2017-06-29 DIAGNOSIS — K529 Noninfective gastroenteritis and colitis, unspecified: Secondary | ICD-10-CM | POA: Diagnosis not present

## 2017-06-29 DIAGNOSIS — R7989 Other specified abnormal findings of blood chemistry: Secondary | ICD-10-CM | POA: Diagnosis not present

## 2017-06-29 DIAGNOSIS — Z8701 Personal history of pneumonia (recurrent): Secondary | ICD-10-CM | POA: Diagnosis not present

## 2017-06-29 DIAGNOSIS — Z66 Do not resuscitate: Secondary | ICD-10-CM | POA: Diagnosis present

## 2017-06-29 DIAGNOSIS — F0280 Dementia in other diseases classified elsewhere without behavioral disturbance: Secondary | ICD-10-CM

## 2017-06-29 DIAGNOSIS — G309 Alzheimer's disease, unspecified: Secondary | ICD-10-CM

## 2017-06-29 DIAGNOSIS — B999 Unspecified infectious disease: Secondary | ICD-10-CM | POA: Diagnosis not present

## 2017-06-29 DIAGNOSIS — R740 Nonspecific elevation of levels of transaminase and lactic acid dehydrogenase [LDH]: Secondary | ICD-10-CM | POA: Diagnosis present

## 2017-06-29 DIAGNOSIS — R6520 Severe sepsis without septic shock: Secondary | ICD-10-CM | POA: Diagnosis not present

## 2017-06-29 DIAGNOSIS — Z96642 Presence of left artificial hip joint: Secondary | ICD-10-CM | POA: Diagnosis present

## 2017-06-29 DIAGNOSIS — E876 Hypokalemia: Secondary | ICD-10-CM | POA: Diagnosis present

## 2017-06-29 DIAGNOSIS — R197 Diarrhea, unspecified: Secondary | ICD-10-CM

## 2017-06-29 DIAGNOSIS — F0391 Unspecified dementia with behavioral disturbance: Secondary | ICD-10-CM | POA: Diagnosis not present

## 2017-06-29 DIAGNOSIS — E872 Acidosis: Secondary | ICD-10-CM | POA: Diagnosis not present

## 2017-06-29 DIAGNOSIS — E46 Unspecified protein-calorie malnutrition: Secondary | ICD-10-CM | POA: Diagnosis not present

## 2017-06-29 DIAGNOSIS — I10 Essential (primary) hypertension: Secondary | ICD-10-CM | POA: Diagnosis present

## 2017-06-29 DIAGNOSIS — N179 Acute kidney failure, unspecified: Secondary | ICD-10-CM | POA: Diagnosis not present

## 2017-06-29 DIAGNOSIS — R1310 Dysphagia, unspecified: Secondary | ICD-10-CM | POA: Diagnosis not present

## 2017-06-29 DIAGNOSIS — A419 Sepsis, unspecified organism: Secondary | ICD-10-CM | POA: Diagnosis not present

## 2017-06-29 DIAGNOSIS — Z7189 Other specified counseling: Secondary | ICD-10-CM | POA: Diagnosis not present

## 2017-06-29 DIAGNOSIS — Z7951 Long term (current) use of inhaled steroids: Secondary | ICD-10-CM | POA: Diagnosis not present

## 2017-06-29 DIAGNOSIS — J690 Pneumonitis due to inhalation of food and vomit: Secondary | ICD-10-CM | POA: Diagnosis not present

## 2017-06-29 DIAGNOSIS — G9340 Encephalopathy, unspecified: Secondary | ICD-10-CM | POA: Diagnosis not present

## 2017-06-29 DIAGNOSIS — E119 Type 2 diabetes mellitus without complications: Secondary | ICD-10-CM | POA: Diagnosis present

## 2017-06-29 DIAGNOSIS — Z515 Encounter for palliative care: Secondary | ICD-10-CM | POA: Diagnosis not present

## 2017-06-29 DIAGNOSIS — A414 Sepsis due to anaerobes: Secondary | ICD-10-CM | POA: Diagnosis present

## 2017-06-29 DIAGNOSIS — K591 Functional diarrhea: Secondary | ICD-10-CM | POA: Diagnosis not present

## 2017-06-29 DIAGNOSIS — A0472 Enterocolitis due to Clostridium difficile, not specified as recurrent: Secondary | ICD-10-CM | POA: Diagnosis not present

## 2017-06-29 DIAGNOSIS — E43 Unspecified severe protein-calorie malnutrition: Secondary | ICD-10-CM | POA: Diagnosis not present

## 2017-06-29 DIAGNOSIS — Z6821 Body mass index (BMI) 21.0-21.9, adult: Secondary | ICD-10-CM | POA: Diagnosis not present

## 2017-06-29 LAB — GASTROINTESTINAL PANEL BY PCR, STOOL (REPLACES STOOL CULTURE)

## 2017-06-29 LAB — BASIC METABOLIC PANEL
Anion gap: 8 (ref 5–15)
BUN: 62 mg/dL — AB (ref 6–20)
CHLORIDE: 114 mmol/L — AB (ref 101–111)
CO2: 21 mmol/L — ABNORMAL LOW (ref 22–32)
Calcium: 7.9 mg/dL — ABNORMAL LOW (ref 8.9–10.3)
Creatinine, Ser: 1.84 mg/dL — ABNORMAL HIGH (ref 0.44–1.00)
GFR calc Af Amer: 25 mL/min — ABNORMAL LOW (ref >60)
GFR calc non Af Amer: 21 mL/min — ABNORMAL LOW (ref >60)
Glucose, Bld: 217 mg/dL — ABNORMAL HIGH (ref 65–99)
POTASSIUM: 4.2 mmol/L (ref 3.5–5.1)
SODIUM: 143 mmol/L (ref 135–145)

## 2017-06-29 LAB — URINALYSIS, ROUTINE W REFLEX MICROSCOPIC
Bilirubin Urine: NEGATIVE
Glucose, UA: NEGATIVE mg/dL
Hgb urine dipstick: NEGATIVE
Ketones, ur: NEGATIVE mg/dL
LEUKOCYTES UA: NEGATIVE
Nitrite: NEGATIVE
PROTEIN: NEGATIVE mg/dL
Specific Gravity, Urine: 1.021 (ref 1.005–1.030)
pH: 5 (ref 5.0–8.0)

## 2017-06-29 LAB — CBC
HEMATOCRIT: 34.6 % — AB (ref 36.0–46.0)
HEMOGLOBIN: 11.1 g/dL — AB (ref 12.0–15.0)
MCH: 27.3 pg (ref 26.0–34.0)
MCHC: 32.1 g/dL (ref 30.0–36.0)
MCV: 85.2 fL (ref 78.0–100.0)
Platelets: 234 10*3/uL (ref 150–400)
RBC: 4.06 MIL/uL (ref 3.87–5.11)
RDW: 15 % (ref 11.5–15.5)
WBC: 14.3 10*3/uL — ABNORMAL HIGH (ref 4.0–10.5)

## 2017-06-29 LAB — C DIFFICILE QUICK SCREEN W PCR REFLEX
C DIFFICILE (CDIFF) TOXIN: NEGATIVE
C DIFFICLE (CDIFF) ANTIGEN: POSITIVE — AB

## 2017-06-29 LAB — CLOSTRIDIUM DIFFICILE BY PCR: CDIFFPCR: POSITIVE — AB

## 2017-06-29 LAB — LACTIC ACID, PLASMA: Lactic Acid, Venous: 2.1 mmol/L (ref 0.5–1.9)

## 2017-06-29 MED ORDER — DEXTROSE 5 % IV SOLN
2.0000 g | Freq: Every day | INTRAVENOUS | Status: DC
  Administered 2017-06-29: 2 g via INTRAVENOUS
  Filled 2017-06-29: qty 2

## 2017-06-29 MED ORDER — ONDANSETRON HCL 4 MG PO TABS: 4.0000 mg | ORAL_TABLET | Freq: Four times a day (QID) | ORAL | Status: DC | PRN

## 2017-06-29 MED ORDER — HEPARIN SODIUM (PORCINE) 5000 UNIT/ML IJ SOLN
5000.0000 [IU] | Freq: Three times a day (TID) | INTRAMUSCULAR | Status: DC
  Administered 2017-06-29 – 2017-07-05 (×21): 5000 [IU] via SUBCUTANEOUS
  Filled 2017-06-29 (×20): qty 1

## 2017-06-29 MED ORDER — ENSURE ENLIVE PO LIQD: 237.0000 mL | Freq: Two times a day (BID) | ORAL | Status: DC

## 2017-06-29 MED ORDER — ACETAMINOPHEN 325 MG PO TABS
650.0000 mg | ORAL_TABLET | Freq: Four times a day (QID) | ORAL | Status: DC | PRN
  Administered 2017-07-02: 650 mg via ORAL
  Filled 2017-06-29: qty 2

## 2017-06-29 MED ORDER — ENOXAPARIN SODIUM 40 MG/0.4ML ~~LOC~~ SOLN: 40.0000 mg | SUBCUTANEOUS | Status: DC

## 2017-06-29 MED ORDER — METRONIDAZOLE IN NACL 5-0.79 MG/ML-% IV SOLN
500.0000 mg | Freq: Three times a day (TID) | INTRAVENOUS | Status: AC
  Administered 2017-06-29 – 2017-07-05 (×21): 500 mg via INTRAVENOUS
  Filled 2017-06-29 (×21): qty 100

## 2017-06-29 MED ORDER — ACETAMINOPHEN 650 MG RE SUPP
650.0000 mg | Freq: Four times a day (QID) | RECTAL | Status: DC | PRN
Start: 2017-06-29 — End: 2017-07-06

## 2017-06-29 MED ORDER — ONDANSETRON HCL 4 MG/2ML IJ SOLN
4.0000 mg | Freq: Four times a day (QID) | INTRAMUSCULAR | Status: DC | PRN
  Administered 2017-07-02: 4 mg via INTRAVENOUS
  Filled 2017-06-29: qty 2

## 2017-06-29 MED ORDER — PLASMA-LYTE 148 IV SOLN
INTRAVENOUS | Status: DC
  Administered 2017-06-29: 12:00:00 via INTRAVENOUS
  Administered 2017-06-29: 1000 mL via INTRAVENOUS
  Administered 2017-06-29: 500 mL via INTRAVENOUS
  Administered 2017-06-30 – 2017-07-01 (×2): via INTRAVENOUS
  Administered 2017-07-01: 75 mL via INTRAVENOUS
  Administered 2017-07-02 – 2017-07-03 (×6): via INTRAVENOUS
  Administered 2017-07-04 (×2): 75 mL via INTRAVENOUS
  Administered 2017-07-05 – 2017-07-06 (×4): via INTRAVENOUS
  Filled 2017-06-29 (×20): qty 500

## 2017-06-29 MED ORDER — GLUCERNA SHAKE PO LIQD
237.0000 mL | Freq: Three times a day (TID) | ORAL | Status: DC
  Administered 2017-06-29 – 2017-07-05 (×18): 237 mL via ORAL
  Filled 2017-06-29 (×23): qty 237

## 2017-06-29 MED ORDER — VANCOMYCIN 50 MG/ML ORAL SOLUTION
125.0000 mg | Freq: Four times a day (QID) | ORAL | Status: DC
  Administered 2017-06-29 – 2017-07-06 (×29): 125 mg via ORAL
  Filled 2017-06-29 (×32): qty 2.5

## 2017-06-29 NOTE — ED Notes (Signed)
This Nurse attempted twice to draw blood for repeat lactic ,unsuccessful. Phlebotomist also attempted ad another Staff, unsuccessful. To notify MD.

## 2017-06-29 NOTE — Progress Notes (Signed)
Notified Dr. Izola Price concerning no urine output from patient today.  Bladder scanned and found 678 ml.  Orders were given for either in and out or foley placement.

## 2017-06-29 NOTE — H&P (Addendum)
History and Physical    Nicole Blake QIO:[id number] DOB: [birth year] DOA: 06/28/2017  PCP: [name], MD  Patient coming from: Home  I have personally briefly reviewed patient's old medical records in [facility]  Chief Complaint: Diarrhea  HPI: Nicole Blake is a 83 y.o. female with medical history significant of Dementia, HTN, on pal care for dementia although not hospice yet since she was still feeding self up until last couple of days.  At baseline can say a few words but not talk in complete sentences.  Patient brought in by daughter for persistent diarrhea for past 8 days.  3-4 foul smelling watery BMs per day.  Is cared for at home by daughter, and has no history of ABx recently (last course was ~1-2 years ago per daughter when she broke her hip).  No N/V.  Appetite diminished.   ED Course: AKI with creat of 2.0.  Initial hypotension with BP in 80s, but repeat BP normal.   Review of Systems: Unable to perform due to dementia.  Past Medical History:  Diagnosis Date  . Dementia   . Diabetes mellitus without complication (HCC)   . Hypertension     Past Surgical History:  Procedure Laterality Date  . ANTERIOR APPROACH HEMI HIP ARTHROPLASTY Left 06/20/2016   Procedure: ANTERIOR APPROACH HEMI HIP ARTHROPLASTY;  Surgeon: Samson Frederic, MD;  Location: [facility];  Service: Orthopedics;  Laterality: Left;     reports that she has never smoked. She has never used smokeless tobacco. She reports that she does not drink alcohol. Her drug history is not on file.  Allergies  Allergen Reactions  . Doxycycline Other (See Comments)    Gi- Upset  . Robitussin (Alcohol Free) [Guaifenesin] Other (See Comments)    Stomach upset  . Septra [Sulfamethoxazole-Trimethoprim] Other (See Comments)    Gi- upset  . Sulfa Antibiotics     pts niece believes she may not be able to take this    History reviewed. No pertinent family history.   Prior to Admission medications     Medication Sig Start Date End Date Taking? Authorizing Provider  fluticasone (FLONASE) 50 MCG/ACT nasal spray Place 2 sprays into both nostrils daily.   Yes [provider]  polyethylene glycol (MIRALAX / GLYCOLAX) packet Take 17 g by mouth daily as needed for mild constipation.   Yes [provider]  bisacodyl (DULCOLAX) 5 MG EC tablet Take 1 tablet (5 mg total) by mouth daily as needed for moderate constipation. Patient not taking: Reported on 06/28/2017 06/22/16   Joseph Art, DO    Physical Exam: Vitals:   06/28/17 2345 06/29/17 0002 06/29/17 0045 06/29/17 0100  BP:  136/66    Pulse: 77 82 80 77  Resp: (!) 21 (!) 24 (!) 21 17  Temp:      TempSrc:      SpO2: 90% 100% 100% 95%  Weight:      Height:        Constitutional: NAD, calm, comfortable Eyes: PERRL, lids and conjunctivae normal ENMT: Mucous membranes are moist. Posterior pharynx clear of any exudate or lesions.Normal dentition.  Neck: normal, supple, no masses, no thyromegaly Respiratory: clear to auscultation bilaterally, no wheezing, no crackles. Normal respiratory effort. No accessory muscle use.  Cardiovascular: 3/6 SEM. No extremity edema. 2+ pedal pulses. No carotid bruits.  Abdomen: no tenderness, no masses palpated. No hepatosplenomegaly. Bowel sounds positive.  Musculoskeletal: no clubbing / cyanosis. No joint deformity upper and lower extremities.  Good ROM, no contractures. Normal muscle tone.  Skin: no rashes, lesions, ulcers. No induration Neurologic: MAE, advanced dementia, non-verbal, doesn't follow commands Psychiatric: Advanced dementia, non-verbal and doesn't follow commands   Labs on Admission: I have personally reviewed following labs and imaging studies  CBC:  Recent Labs Lab 06/28/17 2230  WBC 18.7*  NEUTROABS 15.8*  HGB 11.8*  HCT 36.6  MCV 86.9  PLT 259   Basic Metabolic Panel:  Recent Labs Lab 06/28/17 2230  NA 141  K 4.5  CL 111  CO2 18*  GLUCOSE 272*   BUN 63*  CREATININE 2.09*  CALCIUM 8.3*   GFR: Estimated Creatinine Clearance: 10.8 mL/min (A) (by C-G formula based on SCr of 2.09 mg/dL (H)). Liver Function Tests:  Recent Labs Lab 06/28/17 2230  AST 13*  ALT 10*  ALKPHOS 86  BILITOT 0.3  PROT 5.4*  ALBUMIN 2.4*   No results for input(s): LIPASE, AMYLASE in the last 168 hours. No results for input(s): AMMONIA in the last 168 hours. Coagulation Profile: No results for input(s): INR, PROTIME in the last 168 hours. Cardiac Enzymes: No results for input(s): CKTOTAL, CKMB, CKMBINDEX, TROPONINI in the last 168 hours. BNP (last 3 results) No results for input(s): PROBNP in the last 8760 hours. HbA1C: No results for input(s): HGBA1C in the last 72 hours. CBG: No results for input(s): GLUCAP in the last 168 hours. Lipid Profile: No results for input(s): CHOL, HDL, LDLCALC, TRIG, CHOLHDL, LDLDIRECT in the last 72 hours. Thyroid Function Tests: No results for input(s): TSH, T4TOTAL, FREET4, T3FREE, THYROIDAB in the last 72 hours. Anemia Panel: No results for input(s): VITAMINB12, FOLATE, FERRITIN, TIBC, IRON, RETICCTPCT in the last 72 hours. Urine analysis:    Component Value Date/Time   COLORURINE YELLOW 06/20/2016 0500   APPEARANCEUR CLEAR 06/20/2016 0500   LABSPEC 1.020 06/20/2016 0500   PHURINE 5.5 06/20/2016 0500   GLUCOSEU NEGATIVE 06/20/2016 0500   HGBUR NEGATIVE 06/20/2016 0500   BILIRUBINUR NEGATIVE 06/20/2016 0500   KETONESUR NEGATIVE 06/20/2016 0500   PROTEINUR NEGATIVE 06/20/2016 0500   UROBILINOGEN 1.0 12/24/2008 1113   NITRITE NEGATIVE 06/20/2016 0500   LEUKOCYTESUR NEGATIVE 06/20/2016 0500    Radiological Exams on Admission: Dg Chest 2 View  Result Date: 06/28/2017 CLINICAL DATA:  Shortness of breath on exertion, diarrhea, history of aspiration pneumonia EXAM: CHEST  2 VIEW COMPARISON:  06/19/2016 FINDINGS: Shallow inspiration with linear atelectasis in the lung bases. No airspace disease or  consolidation. No blunting of costophrenic angles. No pneumothorax. Heart size and pulmonary vascularity are normal. Calcified and tortuous aorta. Surgical clips in the right upper quadrant. Degenerative changes in the spine and shoulders. Multiple vertebral compression deformities are poorly visualized due to bone demineralization but appear unchanged. Changes likely represent osteoporosis. IMPRESSION: Shallow inspiration with linear atelectasis in the lung bases. No evidence of active pulmonary disease. Aortic atherosclerosis. Bone demineralization with multiple vertebral compression deformities, likely osteoporosis. Electronically Signed   By: Burman Nieves M.D.   On: 06/28/2017 22:29    EKG: Independently reviewed.  Assessment/Plan Principal Problem:   Diarrhea of presumed infectious origin Active Problems:   Dementia without behavioral disturbance   AKI (acute kidney injury) (HCC)    1. Diarrhea, presumed infectious - 1. Repeat CBC in AM to trend leukocytosis 2. Empiric rocephin / flagyl ordered 3. C.Diff quick scan pending 1. If positive start C.Diff treatment 2. If negative continue rocephin / flagyl 2. AKI - presumably pre-renal from diarrhea and decreased PO intake 1. IVF -  NS 1L in ED and plasmalyte 75 cc/hr 2. Strict intake and output 3. Repeat BMP in AM 3. Dementia - 1. Advanced at baseline 2. Discussed with daughter that we would try treating with ABx, IVF and other reasonable measures. 3. Not doing CT abd/pelvis due to this not likely to change management (even if major intra abdominal catastrophe, this patient who is on palliative care at baseline wouldn't be a candidate for / want major surgery.  DVT prophylaxis: Heparin [place] Code Status: DNR Family Communication: Daughter at bedside Disposition Plan: Home after admit Consults called: None Admission status: Admit to inpatient   Hillary Bow DO [facility] Pager [phone]  If 7AM-7PM, please  contact day team taking care of patient [email or web address] Password [email or web address]  06/29/2017, 1:20 AM

## 2017-06-29 NOTE — Progress Notes (Signed)
Patient tolerated in and out catheterization a 600 ml of urine output was gotten from the pt. We will continue to monitor

## 2017-06-29 NOTE — Progress Notes (Signed)
Pt seen and examined at bedside, resting comfortably. Daughter is at bedside. Pt still with diarrhea. C. Diff toxin negative but antigen positive. Repeat C. Diff pending. Stool studies also pending. Since pt has over 4-5 BM's per day, WBC on admission > 18K, even with antigen only positive screen, pt should be treated for C. Diff. Will discuss with ID on call. Keep on Flagyl, add oral vanc and monitor clinical response. CBC and BMP in AM.  Debbora Presto, MD  [facility] Pager [phone]  If 7PM-7AM, please contact night-coverage [email or web address] Password [email or web address]

## 2017-06-29 NOTE — Progress Notes (Signed)
PHARMACY NOTE:  ANTIMICROBIAL  DOSAGE ADJUSTMENT  Current antimicrobial regimen includes a mismatch between antimicrobial dosage and indication.  As per policy approved by the Pharmacy & Therapeutics and Medical Executive Committees, the antimicrobial dosage will be adjusted accordingly.  Current antimicrobial dosage:  Rocephin 1 Gm IV q24h  Indication: Intra-abdominal infection   Estimated Creatinine Clearance: 10.8 mL/min (A) (by C-G formula based on SCr of 2.09 mg/dL (H)).      On intermittent HD, scheduled:      On CRRT    Antimicrobial dosage has been changed to:  Rocephin 2 Gm IV q24h  Additional comments:   Thank you for allowing pharmacy to be a part of this patient's care.  Lorenza Evangelist, [facility] 06/29/2017 2:32 AM

## 2017-06-29 NOTE — Progress Notes (Signed)
CRITICAL VALUE ALERT  Critical Value:  Lactic acid 2.1  Date & Time Notied:  06/29/2017  0308 am  Provider Notified:  Dr. Julian Reil  Orders Received/Actions taken:  none

## 2017-06-29 NOTE — ED Notes (Signed)
Patient placed on purewick catheter for urinary incontinence.

## 2017-06-29 NOTE — Progress Notes (Signed)
Inpatient Diabetes Program Recommendations  AACE/ADA: New Consensus Statement on Inpatient Glycemic Control (2015)  Target Ranges:  Prepandial:   less than 140 mg/dL      Peak postprandial:   less than 180 mg/dL (1-2 hours)      Critically ill patients:  140 - 180 mg/dL   Results for Nicole Blake, Nicole Blake (MRN [id number]) as of 06/29/2017 08:32  Ref. Range 06/28/2017 22:30 06/29/2017 02:24  Glucose Latest Ref Range: 65 - 99 mg/dL 956 (H) 213 (H)    Admit with: Diarrhea  History: DM, Dementia  Home DM Meds: None  Current Insulin Orders: None     MD- Per H&P, patient has History of DM.  No medications at home.  If within goals of care for this patient, please consider placing orders for Novolog Sensitive Correction Scale/ SSI (0-9 units) TID AC + HS       --Will follow patient during hospitalization--  Ambrose Finland RN, MSN, CDE Diabetes Coordinator Inpatient Glycemic Control Team Team Pager: [phone] (8a-5p)

## 2017-06-29 NOTE — ED Notes (Signed)
I attempted to collect labs and was unsuccessful. 

## 2017-06-29 NOTE — ED Notes (Signed)
Patient is on a purewick catheter but has produced any urine at this time for a specimen. Will recheck for collection within the hour.

## 2017-06-30 LAB — BASIC METABOLIC PANEL
ANION GAP: 10 (ref 5–15)
BUN: 44 mg/dL — ABNORMAL HIGH (ref 6–20)
CHLORIDE: 113 mmol/L — AB (ref 101–111)
CO2: 19 mmol/L — ABNORMAL LOW (ref 22–32)
Calcium: 7.7 mg/dL — ABNORMAL LOW (ref 8.9–10.3)
Creatinine, Ser: 1.24 mg/dL — ABNORMAL HIGH (ref 0.44–1.00)
GFR calc Af Amer: 40 mL/min — ABNORMAL LOW (ref >60)
GFR, EST NON AFRICAN AMERICAN: 34 mL/min — AB (ref >60)
GLUCOSE: 162 mg/dL — AB (ref 65–99)
POTASSIUM: 3.5 mmol/L (ref 3.5–5.1)
Sodium: 142 mmol/L (ref 135–145)

## 2017-06-30 LAB — CBC
HCT: 33.6 % — ABNORMAL LOW (ref 36.0–46.0)
HEMOGLOBIN: 10.8 g/dL — AB (ref 12.0–15.0)
MCH: 28 pg (ref 26.0–34.0)
MCHC: 32.1 g/dL (ref 30.0–36.0)
MCV: 87 fL (ref 78.0–100.0)
PLATELETS: 241 10*3/uL (ref 150–400)
RBC: 3.86 MIL/uL — AB (ref 3.87–5.11)
RDW: 15.3 % (ref 11.5–15.5)
WBC: 12.1 10*3/uL — AB (ref 4.0–10.5)

## 2017-06-30 MED ORDER — PRO-STAT SUGAR FREE PO LIQD
30.0000 mL | Freq: Every day | ORAL | Status: DC
  Administered 2017-06-30 – 2017-07-04 (×4): 30 mL via ORAL
  Filled 2017-06-30 (×6): qty 30

## 2017-06-30 MED ORDER — LIP MEDEX EX OINT
TOPICAL_OINTMENT | CUTANEOUS | Status: AC
  Administered 2017-06-30: 09:00:00
  Filled 2017-06-30: qty 7

## 2017-06-30 NOTE — Progress Notes (Signed)
Patient failed voiding trial and a 14 F foley catheter was successfully inserted. We will continue to monitor.

## 2017-06-30 NOTE — Progress Notes (Addendum)
PROGRESS NOTE    GENEVIENE Blake  AVW:[id number] DOB: 05-May-1915 DOA: 06/28/2017 PCP: Nicole Floro, MD    Brief Narrative: 83 y.o. female with medical history significant of Dementia, HTN, on pal care for dementia although not hospice yet since she was still feeding self up until last couple of days.  At baseline can say a few words but not talk in complete sentences.  Patient brought in by daughter for persistent diarrhea for past 8 days.  3-4 foul smelling watery BMs per day.  Is cared for at home by daughter, and has no history of ABx recently (last course was ~1-2 years ago per daughter when she broke her hip).  No N/V.  Appetite diminished.   Assessment & Plan:   Principal Problem:   Diarrhea of presumed infectious origin Active Problems:   Dementia without behavioral disturbance   AKI (acute kidney injury) (HCC)  Cdiff positive antigen negative toxin -on vanc and flagyl.  Leukocytosis improved.  aki ivf.  Hypokalemia normalized.    DVT prophylaxis: heparin Code Status: dnr Family Communication:  Disposition Plan: tbd   Consultants:   Procedures   Antimicrobials:    vanc/flagyl  Subjective: resting  Objective: Vitals:   06/29/17 1357 06/29/17 2100 06/30/17 0508 06/30/17 1400  BP: 128/68 114/87 122/60 140/70  Pulse: 87 99 84   Resp: Temp: 98.3 F (36.8 C) 97.6 F (36.4 C) 99.1 F (37.3 C)   TempSrc: Oral Oral Axillary   SpO2: 100% 95% 100% 100%  Weight:      Height:        Intake/Output Summary (Last 24 hours) at 06/30/17 1546 Last data filed at 06/30/17 1400  Gross per 24 hour  Intake             2040 ml  Output              850 ml  Net             1190 ml   Filed Weights   06/28/17 2102 06/29/17 0233  Weight: 56.7 kg (125 lb) 52.1 kg (114 lb 13.8 oz)    Examination:  General exam: Appears calm and comfortable  Respiratory system: Clear to auscultation. Respiratory effort normal. Cardiovascular system: S1 &  S2 heard, RRR. No JVD, murmurs, rubs, gallops or clicks. No pedal edema. Gastrointestinal system: Abdomen is nondistended, soft and nontender. No organomegaly or masses felt. Normal bowel sounds heard. Central nervous system: Alert and oriented. No focal neurological deficits. Extremities: Symmetric 5 x 5 power. Skin: No rashes, lesions or ulcers Psychiatry: Judgement and insight appear normal. Mood & affect appropriate.     Data Reviewed: I have personally reviewed following labs and imaging studies  CBC:  Recent Labs Lab 06/28/17 2230 06/29/17 0224 06/30/17 0529  WBC 18.7* 14.3* 12.1*  NEUTROABS 15.8*  --   --   HGB 11.8* 11.1* 10.8*  HCT 36.6 34.6* 33.6*  MCV 86.9 85.2 87.0  PLT 259 234 241   Basic Metabolic Panel:  Recent Labs Lab 06/28/17 2230 06/29/17 0224 06/30/17 0529  NA 141 143 142  K 4.5 4.2 3.5  CL 111 114* 113*  CO2 18* 21* 19*  GLUCOSE 272* 217* 162*  BUN 63* 62* 44*  CREATININE 2.09* 1.84* 1.24*  CALCIUM 8.3* 7.9* 7.7*   GFR: Estimated Creatinine Clearance: 18.2 mL/min (A) (by C-G formula based on SCr of 1.24 mg/dL (H)). Liver Function Tests:  Recent Labs Lab 06/28/17 2230  AST 13*  ALT 10*  ALKPHOS 86  BILITOT 0.3  PROT 5.4*  ALBUMIN 2.4*   No results for input(s): LIPASE, AMYLASE in the last 168 hours. No results for input(s): AMMONIA in the last 168 hours. Coagulation Profile: No results for input(s): INR, PROTIME in the last 168 hours. Cardiac Enzymes: No results for input(s): CKTOTAL, CKMB, CKMBINDEX, TROPONINI in the last 168 hours. BNP (last 3 results) No results for input(s): PROBNP in the last 8760 hours. HbA1C: No results for input(s): HGBA1C in the last 72 hours. CBG: No results for input(s): GLUCAP in the last 168 hours. Lipid Profile: No results for input(s): CHOL, HDL, LDLCALC, TRIG, CHOLHDL, LDLDIRECT in the last 72 hours. Thyroid Function Tests: No results for input(s): TSH, T4TOTAL, FREET4, T3FREE, THYROIDAB in the  last 72 hours. Anemia Panel: No results for input(s): VITAMINB12, FOLATE, FERRITIN, TIBC, IRON, RETICCTPCT in the last 72 hours. Sepsis Labs:  Recent Labs Lab 06/28/17 2248 06/29/17 0224  LATICACIDVEN 2.9* 2.1*    Recent Results (from the past 240 hour(s))  Gastrointestinal Panel by PCR , Stool     Status: None   Collection Time: 06/29/17  5:31 AM  Result Value Ref Range Status   Campylobacter species NOT DETECTED NOT DETECTED Final   Plesimonas shigelloides NOT DETECTED NOT DETECTED Final   Salmonella species NOT DETECTED NOT DETECTED Final   Yersinia enterocolitica NOT DETECTED NOT DETECTED Final   Vibrio species NOT DETECTED NOT DETECTED Final   Vibrio cholerae NOT DETECTED NOT DETECTED Final   Enteroaggregative E coli (EAEC) NOT DETECTED NOT DETECTED Final   Enteropathogenic E coli (EPEC) NOT DETECTED NOT DETECTED Final   Enterotoxigenic E coli (ETEC) NOT DETECTED NOT DETECTED Final   Shiga like toxin producing E coli (STEC) NOT DETECTED NOT DETECTED Final   Shigella/Enteroinvasive E coli (EIEC) NOT DETECTED NOT DETECTED Final   Cryptosporidium NOT DETECTED NOT DETECTED Final   Cyclospora cayetanensis NOT DETECTED NOT DETECTED Final   Entamoeba histolytica NOT DETECTED NOT DETECTED Final   Giardia lamblia NOT DETECTED NOT DETECTED Final   Adenovirus F40/41 NOT DETECTED NOT DETECTED Final   Astrovirus NOT DETECTED NOT DETECTED Final   Norovirus GI/GII NOT DETECTED NOT DETECTED Final   Rotavirus A NOT DETECTED NOT DETECTED Final   Sapovirus (I, II, IV, and V) NOT DETECTED NOT DETECTED Final  C difficile quick scan w PCR reflex     Status: Abnormal   Collection Time: 06/29/17  5:31 AM  Result Value Ref Range Status   C Diff antigen POSITIVE (A) NEGATIVE Final   C Diff toxin NEGATIVE NEGATIVE Final   C Diff interpretation Results are indeterminate. See PCR results.  Final  Clostridium Difficile by PCR     Status: Abnormal   Collection Time: 06/29/17  5:31 AM  Result Value  Ref Range Status   Toxigenic C Difficile by pcr POSITIVE (A) NEGATIVE Final    Comment: Positive for toxigenic C. difficile with little to no toxin production. Only treat if clinical presentation suggests symptomatic illness. Performed at [facility], [address] [place], [place] [address]          Radiology Studies: Dg Chest 2 View  Result Date: 06/28/2017 CLINICAL DATA:  Shortness of breath on exertion, diarrhea, history of aspiration pneumonia EXAM: CHEST  2 VIEW COMPARISON:  06/19/2016 FINDINGS: Shallow inspiration with linear atelectasis in the lung bases. No airspace disease or consolidation. No blunting of costophrenic angles. No pneumothorax. Heart size and pulmonary vascularity  are normal. Calcified and tortuous aorta. Surgical clips in the right upper quadrant. Degenerative changes in the spine and shoulders. Multiple vertebral compression deformities are poorly visualized due to bone demineralization but appear unchanged. Changes likely represent osteoporosis. IMPRESSION: Shallow inspiration with linear atelectasis in the lung bases. No evidence of active pulmonary disease. Aortic atherosclerosis. Bone demineralization with multiple vertebral compression deformities, likely osteoporosis. Electronically Signed   By: Burman Nieves M.D.   On: 06/28/2017 22:29        Scheduled Meds: . feeding supplement (GLUCERNA SHAKE)  237 mL Oral TID BM  . feeding supplement (PRO-STAT SUGAR FREE 64)  30 mL Oral Daily  . heparin  5,000 Units Subcutaneous Q8H  . vancomycin  125 mg Oral Q6H   Continuous Infusions: . electrolyte-148 75 mL/hr at 06/30/17 1110  . metronidazole 500 mg (06/30/17 1538)     LOS: 1 day       Alwyn Ren, MD [facility]   If 7PM-7AM, please contact night-coverage [email or web address] Password [email or web address] 06/30/2017, 3:46 PM

## 2017-06-30 NOTE — Progress Notes (Signed)
Initial Nutrition Assessment  DOCUMENTATION CODES:   Severe malnutrition in context of chronic illness  INTERVENTION:   -Continue Glucerna Shake po TID, each supplement provides 220 kcal and 10 grams of protein -Provide Prostat liquid protein PO 30 ml once with meals, each supplement provides 100 kcal, 15 grams protein. -Family to continue to help feed patient  RD will continue to monitor  NUTRITION DIAGNOSIS:   Malnutrition(severe) related to chronic illness (advanced dementia) as evidenced by energy intake < or equal to 75% for > or equal to 1 month, severe depletion of body fat, severe depletion of muscle mass.  GOAL:   Patient will meet greater than or equal to 90% of their needs  MONITOR:   PO intake, Supplement acceptance, Labs, Weight trends, I & O's  REASON FOR ASSESSMENT:   Malnutrition Screening Tool    ASSESSMENT:   83 y.o. female with medical history significant of Dementia, HTN, on pal care for dementia although not hospice yet since she was still feeding self up until last couple of days.  At baseline can say a few words but not talk in complete sentences.  Patient in room with daughter. Pt not able to give history given dementia. Pt's daughter reports the patient's diarrhea began 9/28 and worsened since. Pt did not eat very well, used to eat breakfast every morning but now doesn't eat any meals without constant encouragement. Pt's daughter currently feeding patient yogurt and applesauce. Pt was sipping on apple juice, informed pt's daughter that apple juice can worsen diarrhea. Reviewed BRAT diet with daughter. Pt drinks Glucerna shake at home. Will trial once prostat daily.  Per chart review, pt has lost 14 lb since October 2017, insignificant for time frame. Nutrition-Focused physical exam completed. Findings are severe fat depletion, severe muscle depletion, and no edema.   Medications reviewed. Labs reviewed: GFR: 40  Diet Order:  DIET SOFT Room service  appropriate? Yes; Fluid consistency: Thin  Skin:  Reviewed, no issues  Last BM:  10/3  Height:   Ht Readings from Last 1 Encounters:  06/28/17  (1.549 m)    Weight:   Wt Readings from Last 1 Encounters:  06/29/17 114 lb 13.8 oz (52.1 kg)    Ideal Body Weight:  47.7 kg  BMI:  Body mass index is 21.7 kg/m.  Estimated Nutritional Needs:   Kcal:  1100-1300  Protein:  50-60g  Fluid:  1.4L/day  EDUCATION NEEDS:   Education needs addressed  Tilda Franco, MS, RD, LDN Pager: [phone] After Hours Pager: [phone]

## 2017-07-01 NOTE — Progress Notes (Signed)
PROGRESS NOTE    Nicole Blake  ZOX:[id number] DOB: 08/28/1915 DOA: 06/28/2017 PCP: [name], MD    Brief Narrative: 83 y.o.femalewith medical history significant of Dementia, HTN, on pal care for dementia although not hospice yet since she was still feeding self up until last couple of days. At baseline can say a few words but not talk in complete sentences.  Patient brought in by neice for persistent diarrhea for past 8 days. 3-4 foul smelling watery BMs per day. Is cared for at home by daughter, and has no history of ABx recently (last course was ~1-2 years ago per daughter when she broke her hip). I had long dw niece.she is asking for an appetitie stimulant.if patient does not get better will get hospice consult. patient continues with no appetiite and not wanting to eat.   Assessment & Plan:   Principal Problem:   Diarrhea of presumed infectious origin Active Problems:   Dementia without behavioral disturbance   AKI (acute kidney injury) (HCC)   cdiff better.stool not runny any more.having fewer bms.continue antibiotics and finish the course.will start megace and see if appetitie improves.  DVT prophylaxis:heparin Code Status: dnr Family Communication:neice/poa Disposition Plan: tbd  Consultants: none  Procedures:  Antimicrobials: vanco/flagyl   Subjective:resting in bed   Objective: Vitals:   06/30/17 0508 06/30/17 1400 06/30/17 2031 07/01/17 0603  BP: 122/60 140/70 (!) 107/52 136/62  Pulse: 84  77   Resp: Temp: 99.1 F (37.3 C)  98.4 F (36.9 C) (!) 97 F (36.1 C)  TempSrc: Axillary  Axillary Axillary  SpO2: 100% 100% 99% 99%  Weight:      Height:        Intake/Output Summary (Last 24 hours) at 07/01/17 1245 Last data filed at 07/01/17 1100  Gross per 24 hour  Intake             3180 ml  Output              700 ml  Net             2480 ml   Filed Weights   06/28/17 2102 06/29/17 0233  Weight: 56.7 kg (125 lb)  52.1 kg (114 lb 13.8 oz)    Examination:  General exam: Appears calm and comfortable  Respiratory system: Clear to auscultation. Respiratory effort normal. Cardiovascular system: S1 & S2 heard, RRR. No JVD, murmurs, rubs, gallops or clicks. No pedal edema. Gastrointestinal system: Abdomen is nondistended, soft and nontender. No organomegaly or masses felt. Normal bowel sounds heard. Central nervous system: Alert and oriented. No focal neurological deficits. Extremities: Symmetric 5 x 5 power. Skin: No rashes, lesions or ulcers Psychiatry: Judgement and insight appear normal. Mood & affect appropriate.     Data Reviewed: I have personally reviewed following labs and imaging studies  CBC:  Recent Labs Lab 06/28/17 2230 06/29/17 0224 06/30/17 0529  WBC 18.7* 14.3* 12.1*  NEUTROABS 15.8*  --   --   HGB 11.8* 11.1* 10.8*  HCT 36.6 34.6* 33.6*  MCV 86.9 85.2 87.0  PLT 259 234 241   Basic Metabolic Panel:  Recent Labs Lab 06/28/17 2230 06/29/17 0224 06/30/17 0529  NA 141 143 142  K 4.5 4.2 3.5  CL 111 114* 113*  CO2 18* 21* 19*  GLUCOSE 272* 217* 162*  BUN 63* 62* 44*  CREATININE 2.09* 1.84* 1.24*  CALCIUM 8.3* 7.9* 7.7*   GFR: Estimated Creatinine Clearance: 18.2 mL/min (A) (by C-G  formula based on SCr of 1.24 mg/dL (H)). Liver Function Tests:  Recent Labs Lab 06/28/17 2230  AST 13*  ALT 10*  ALKPHOS 86  BILITOT 0.3  PROT 5.4*  ALBUMIN 2.4*   No results for input(s): LIPASE, AMYLASE in the last 168 hours. No results for input(s): AMMONIA in the last 168 hours. Coagulation Profile: No results for input(s): INR, PROTIME in the last 168 hours. Cardiac Enzymes: No results for input(s): CKTOTAL, CKMB, CKMBINDEX, TROPONINI in the last 168 hours. BNP (last 3 results) No results for input(s): PROBNP in the last 8760 hours. HbA1C: No results for input(s): HGBA1C in the last 72 hours. CBG: No results for input(s): GLUCAP in the last 168 hours. Lipid  Profile: No results for input(s): CHOL, HDL, LDLCALC, TRIG, CHOLHDL, LDLDIRECT in the last 72 hours. Thyroid Function Tests: No results for input(s): TSH, T4TOTAL, FREET4, T3FREE, THYROIDAB in the last 72 hours. Anemia Panel: No results for input(s): VITAMINB12, FOLATE, FERRITIN, TIBC, IRON, RETICCTPCT in the last 72 hours. Sepsis Labs:  Recent Labs Lab 06/28/17 2248 06/29/17 0224  LATICACIDVEN 2.9* 2.1*    Recent Results (from the past 240 hour(s))  Gastrointestinal Panel by PCR , Stool     Status: None   Collection Time: 06/29/17  5:31 AM  Result Value Ref Range Status   Campylobacter species NOT DETECTED NOT DETECTED Final   Plesimonas shigelloides NOT DETECTED NOT DETECTED Final   Salmonella species NOT DETECTED NOT DETECTED Final   Yersinia enterocolitica NOT DETECTED NOT DETECTED Final   Vibrio species NOT DETECTED NOT DETECTED Final   Vibrio cholerae NOT DETECTED NOT DETECTED Final   Enteroaggregative E coli (EAEC) NOT DETECTED NOT DETECTED Final   Enteropathogenic E coli (EPEC) NOT DETECTED NOT DETECTED Final   Enterotoxigenic E coli (ETEC) NOT DETECTED NOT DETECTED Final   Shiga like toxin producing E coli (STEC) NOT DETECTED NOT DETECTED Final   Shigella/Enteroinvasive E coli (EIEC) NOT DETECTED NOT DETECTED Final   Cryptosporidium NOT DETECTED NOT DETECTED Final   Cyclospora cayetanensis NOT DETECTED NOT DETECTED Final   Entamoeba histolytica NOT DETECTED NOT DETECTED Final   Giardia lamblia NOT DETECTED NOT DETECTED Final   Adenovirus F40/41 NOT DETECTED NOT DETECTED Final   Astrovirus NOT DETECTED NOT DETECTED Final   Norovirus GI/GII NOT DETECTED NOT DETECTED Final   Rotavirus A NOT DETECTED NOT DETECTED Final   Sapovirus (I, II, IV, and V) NOT DETECTED NOT DETECTED Final  C difficile quick scan w PCR reflex     Status: Abnormal   Collection Time: 06/29/17  5:31 AM  Result Value Ref Range Status   C Diff antigen POSITIVE (A) NEGATIVE Final   C Diff toxin  NEGATIVE NEGATIVE Final   C Diff interpretation Results are indeterminate. See PCR results.  Final  Clostridium Difficile by PCR     Status: Abnormal   Collection Time: 06/29/17  5:31 AM  Result Value Ref Range Status   Toxigenic C Difficile by pcr POSITIVE (A) NEGATIVE Final    Comment: Positive for toxigenic C. difficile with little to no toxin production. Only treat if clinical presentation suggests symptomatic illness. Performed at [facility], [address], [place], [place] [address]          Radiology Studies: No results found.      Scheduled Meds: . feeding supplement (GLUCERNA SHAKE)  237 mL Oral TID BM  . feeding supplement (PRO-STAT SUGAR FREE 64)  30 mL Oral Daily  . heparin  5,000 Units Subcutaneous Q8H  . vancomycin  125 mg Oral Q6H   Continuous Infusions: . electrolyte-148 75 mL (07/01/17 0540)  . metronidazole Stopped (07/01/17 1610)     LOS: 2 days     Alwyn Ren, MD [facility]  If 7PM-7AM, please contact night-coverage [email or web address] Password [email or web address] 07/01/2017, 12:45 PM

## 2017-07-02 NOTE — Care Management Important Message (Addendum)
Important Message  Patient Details IM Letter given to Suzanne/Case Manager to present to Patient Name: Nicole Blake MRN: [id number] Date of Birth: 08-Oct-1914   Medicare Important Message Given:  Yes    Caren Macadam 07/02/2017, 10:09 AMImportant Message  Patient Details  Name: Nicole Blake MRN: [id number] Date of Birth: 12/19/1914   Medicare Important Message Given:  Yes    Caren Macadam 07/02/2017, 10:09 AM

## 2017-07-02 NOTE — Progress Notes (Signed)
PROGRESS NOTE    Nicole Blake  VZD:[id number] DOB: 1915/05/29 DOA: 06/28/2017 PCP: [name], MD  Brief Narrative:83 y.o.femalewith medical history significant of Dementia, HTN, on pal care for dementia although not hospice yet since she was still feeding self up until last couple of days. At baseline can say a few words but not talk in complete sentences.  Patient brought in by neice for persistent diarrhea for past 8 days. 3-4 foul smelling watery BMs per day. Is cared for at home by daughter, and has no history of ABx recently (last course was ~1-2 years ago per daughter when she broke her hip). I had long dw niece.she is asking for an appetitie stimulant.if patient does not get better will get hospice consult. patient continues with no appetiite and not wanting to eat    Assessment & Plan:   Principal Problem:   Diarrhea of presumed infectious origin Active Problems:   Dementia without behavioral disturbance   AKI (acute kidney injury) (HCC)  cdiff colitis-continue vanc/flagyl.palliative care consult.  DVT prophylaxis:  Code Status:  Family Communication:  Disposition Plan:    Consultants:   Procedures:    Antimicrobials:    Subjective:  Objective: Vitals:   07/01/17 0603 07/01/17 1300 07/01/17 2200 07/02/17 0531  BP: 136/62 131/72 (!) 133/59 125/63  Pulse:   74 77  Resp: Temp: (!) 97 F (36.1 C) 97.7 F (36.5 C) 98.1 F (36.7 C) 97.8 F (36.6 C)  TempSrc: Axillary Oral Axillary Axillary  SpO2: 99% 98% 100% 99%  Weight:      Height:        Intake/Output Summary (Last 24 hours) at 07/02/17 1213 Last data filed at 07/02/17 1100  Gross per 24 hour  Intake             2610 ml  Output              250 ml  Net             2360 ml   Filed Weights   06/28/17 2102 06/29/17 0233  Weight: 56.7 kg (125 lb) 52.1 kg (114 lb 13.8 oz)    Examination:  General exam: Appears calm and comfortable  Respiratory system: Clear to  auscultation. Respiratory effort normal. Cardiovascular system: S1 & S2 heard, RRR. No JVD, murmurs, rubs, gallops or clicks. No pedal edema. Gastrointestinal system: Abdomen is nondistended, soft and nontender. No organomegaly or masses felt. Normal bowel sounds heard. Central nervous system: Alert and oriented. No focal neurological deficits. Extremities: Symmetric 5 x 5 power. Skin: No rashes, lesions or ulcers Psychiatry: Judgement and insight appear normal. Mood & affect appropriate.     Data Reviewed: I have personally reviewed following labs and imaging studies  CBC:  Recent Labs Lab 06/28/17 2230 06/29/17 0224 06/30/17 0529  WBC 18.7* 14.3* 12.1*  NEUTROABS 15.8*  --   --   HGB 11.8* 11.1* 10.8*  HCT 36.6 34.6* 33.6*  MCV 86.9 85.2 87.0  PLT 259 234 241   Basic Metabolic Panel:  Recent Labs Lab 06/28/17 2230 06/29/17 0224 06/30/17 0529  NA 141 143 142  K 4.5 4.2 3.5  CL 111 114* 113*  CO2 18* 21* 19*  GLUCOSE 272* 217* 162*  BUN 63* 62* 44*  CREATININE 2.09* 1.84* 1.24*  CALCIUM 8.3* 7.9* 7.7*   GFR: Estimated Creatinine Clearance: 18.2 mL/min (A) (by C-G formula based on SCr of 1.24 mg/dL (H)). Liver Function Tests:  Recent  Labs Lab 06/28/17 2230  AST 13*  ALT 10*  ALKPHOS 86  BILITOT 0.3  PROT 5.4*  ALBUMIN 2.4*   No results for input(s): LIPASE, AMYLASE in the last 168 hours. No results for input(s): AMMONIA in the last 168 hours. Coagulation Profile: No results for input(s): INR, PROTIME in the last 168 hours. Cardiac Enzymes: No results for input(s): CKTOTAL, CKMB, CKMBINDEX, TROPONINI in the last 168 hours. BNP (last 3 results) No results for input(s): PROBNP in the last 8760 hours. HbA1C: No results for input(s): HGBA1C in the last 72 hours. CBG: No results for input(s): GLUCAP in the last 168 hours. Lipid Profile: No results for input(s): CHOL, HDL, LDLCALC, TRIG, CHOLHDL, LDLDIRECT in the last 72 hours. Thyroid Function Tests: No  results for input(s): TSH, T4TOTAL, FREET4, T3FREE, THYROIDAB in the last 72 hours. Anemia Panel: No results for input(s): VITAMINB12, FOLATE, FERRITIN, TIBC, IRON, RETICCTPCT in the last 72 hours. Sepsis Labs:  Recent Labs Lab 06/28/17 2248 06/29/17 0224  LATICACIDVEN 2.9* 2.1*    Recent Results (from the past 240 hour(s))  Gastrointestinal Panel by PCR , Stool     Status: None   Collection Time: 06/29/17  5:31 AM  Result Value Ref Range Status   Campylobacter species NOT DETECTED NOT DETECTED Final   Plesimonas shigelloides NOT DETECTED NOT DETECTED Final   Salmonella species NOT DETECTED NOT DETECTED Final   Yersinia enterocolitica NOT DETECTED NOT DETECTED Final   Vibrio species NOT DETECTED NOT DETECTED Final   Vibrio cholerae NOT DETECTED NOT DETECTED Final   Enteroaggregative E coli (EAEC) NOT DETECTED NOT DETECTED Final   Enteropathogenic E coli (EPEC) NOT DETECTED NOT DETECTED Final   Enterotoxigenic E coli (ETEC) NOT DETECTED NOT DETECTED Final   Shiga like toxin producing E coli (STEC) NOT DETECTED NOT DETECTED Final   Shigella/Enteroinvasive E coli (EIEC) NOT DETECTED NOT DETECTED Final   Cryptosporidium NOT DETECTED NOT DETECTED Final   Cyclospora cayetanensis NOT DETECTED NOT DETECTED Final   Entamoeba histolytica NOT DETECTED NOT DETECTED Final   Giardia lamblia NOT DETECTED NOT DETECTED Final   Adenovirus F40/41 NOT DETECTED NOT DETECTED Final   Astrovirus NOT DETECTED NOT DETECTED Final   Norovirus GI/GII NOT DETECTED NOT DETECTED Final   Rotavirus A NOT DETECTED NOT DETECTED Final   Sapovirus (I, II, IV, and V) NOT DETECTED NOT DETECTED Final  C difficile quick scan w PCR reflex     Status: Abnormal   Collection Time: 06/29/17  5:31 AM  Result Value Ref Range Status   C Diff antigen POSITIVE (A) NEGATIVE Final   C Diff toxin NEGATIVE NEGATIVE Final   C Diff interpretation Results are indeterminate. See PCR results.  Final  Clostridium Difficile by PCR      Status: Abnormal   Collection Time: 06/29/17  5:31 AM  Result Value Ref Range Status   Toxigenic C Difficile by pcr POSITIVE (A) NEGATIVE Final    Comment: Positive for toxigenic C. difficile with little to no toxin production. Only treat if clinical presentation suggests symptomatic illness. Performed at [facility], [address], [place], [place] [address]          Radiology Studies: No results found.      Scheduled Meds: . feeding supplement (GLUCERNA SHAKE)  237 mL Oral TID BM  . feeding supplement (PRO-STAT SUGAR FREE 64)  30 mL Oral Daily  . heparin  5,000 Units Subcutaneous Q8H  . vancomycin  125 mg Oral Q6H  Continuous Infusions: . electrolyte-148 75 mL/hr at 07/02/17 0726  . metronidazole Stopped (07/02/17 0743)     LOS: 3 days     Alwyn Ren, MD [facility]  If 7PM-7AM, please contact night-coverage [email or web address] Password [email or web address] 07/02/2017, 12:13 PM

## 2017-07-02 NOTE — Progress Notes (Signed)
Rec'd call from lab personnel that stated that "stool specimen previously sent for c-diff testing is too thick and needs to be almost like water to have that testing performed."

## 2017-07-03 DIAGNOSIS — E43 Unspecified severe protein-calorie malnutrition: Secondary | ICD-10-CM

## 2017-07-03 DIAGNOSIS — Z515 Encounter for palliative care: Secondary | ICD-10-CM

## 2017-07-03 DIAGNOSIS — E872 Acidosis: Secondary | ICD-10-CM

## 2017-07-03 DIAGNOSIS — Z7189 Other specified counseling: Secondary | ICD-10-CM

## 2017-07-03 NOTE — Progress Notes (Signed)
  Progress Note   Date: 07/03/2017  Patient Name: Nicole Blake        MRN#: [id number]  Review of the patient's clinical findings supports the diagnosis of  } Yes patient has severe malnutrition.  Alwyn Ren, MD

## 2017-07-03 NOTE — Progress Notes (Signed)
[facility] PROGRESS NOTE  ARLEY GARANT ZOX:[id number] DOB: [birth year] DOA: 06/28/2017 PCP: [name], MD  Interim summary and HPI 83 y.o.femalewith medical history significant of Dementia, HTN, on pal care for dementia although not hospice yet since she was still feeding self up until last couple of days. At baseline can say a few words but not talk in complete sentences.  Patient brought in by neicefor persistent diarrhea for past 8 days. 3-4 foul smelling watery BMs per day. Is cared for at home by daughter, and has no history of ABx recently. Found to be positive for C. Diff infection (community acquired)  Assessment/Plan: 1-Sepsis due to C. Diff colitis: -no fever, WBC's trending down -patient not eating  -will continue IV flagyl and PO vancomycin  2-metabolic acidosis -due to diarrhea -has improved with IVF's and electrolytes repletion -will monitor  3-aki -pre-renal in nature -improved with fluid resuscitation -will monitor trend  4-protein calorie malnutrition: severe -will continue encouraging PO intake and use feeding supplements  5-dementia w/o behavioral disturbances -continue supportive care  Code Status: DNR/DNI Family Communication: no family at bedside  Disposition Plan: will continue current IV and oral antibiotics, family meeting on Monday. Continue supportive care.   Consultants:  palliative care   Procedures:  See below for x-ray reports   Antibiotics:  Vancomycin  Flagyl   HPI/Subjective: Afebrile, no CP, no SOB, no nausea, no vomiting.  Objective: Vitals:   07/03/17 0658 07/03/17 1444  BP: (!) 129/53   Pulse: 80 73  Resp: 18 (!) 22  Temp: (!) 97.2 F (36.2 C) (!) 97.5 F (36.4 C)  SpO2: 98% 100%    Intake/Output Summary (Last 24 hours) at 07/03/17 1912 Last data filed at 07/03/17 1800  Gross per 24 hour  Intake             2640 ml  Output              400 ml  Net             2240 ml   Filed Weights    06/28/17 2102 06/29/17 0233  Weight: 56.7 kg (125 lb) 52.1 kg (114 lb 13.8 oz)    Exam:   General:  Afebrile, patient alert awake oriented 1 with difficulty communicating herself (due to underlying dementia). No chest pain, no shortness of breath. Nursing staff reported improvement in amount of loose stools.no nausea or vomiting  Cardiovascular: .S1 and S2, no polyps, no gallops, positive systolic ejection murmur, no JVD.  Respiratory: good air movement bilaterally, no wheezing, no crackles  Abdomen: soft, ND, positive BS, no tenderness  Musculoskeletal: no edema, no cyanosis  Data Reviewed: Basic Metabolic Panel:  Recent Labs Lab 06/28/17 2230 06/29/17 0224 06/30/17 0529  NA 141 143 142  K 4.5 4.2 3.5  CL 111 114* 113*  CO2 18* 21* 19*  GLUCOSE 272* 217* 162*  BUN 63* 62* 44*  CREATININE 2.09* 1.84* 1.24*  CALCIUM 8.3* 7.9* 7.7*   Liver Function Tests:  Recent Labs Lab 06/28/17 2230  AST 13*  ALT 10*  ALKPHOS 86  BILITOT 0.3  PROT 5.4*  ALBUMIN 2.4*   CBC:  Recent Labs Lab 06/28/17 2230 06/29/17 0224 06/30/17 0529  WBC 18.7* 14.3* 12.1*  NEUTROABS 15.8*  --   --   HGB 11.8* 11.1* 10.8*  HCT 36.6 34.6* 33.6*  MCV 86.9 85.2 87.0  PLT 259 234 241    CBG: No results for input(s): GLUCAP in the last 168  hours.  Recent Results (from the past 240 hour(s))  Gastrointestinal Panel by PCR , Stool     Status: None   Collection Time: 06/29/17  5:31 AM  Result Value Ref Range Status   Campylobacter species NOT DETECTED NOT DETECTED Final   Plesimonas shigelloides NOT DETECTED NOT DETECTED Final   Salmonella species NOT DETECTED NOT DETECTED Final   Yersinia enterocolitica NOT DETECTED NOT DETECTED Final   Vibrio species NOT DETECTED NOT DETECTED Final   Vibrio cholerae NOT DETECTED NOT DETECTED Final   Enteroaggregative E coli (EAEC) NOT DETECTED NOT DETECTED Final   Enteropathogenic E coli (EPEC) NOT DETECTED NOT DETECTED Final   Enterotoxigenic E  coli (ETEC) NOT DETECTED NOT DETECTED Final   Shiga like toxin producing E coli (STEC) NOT DETECTED NOT DETECTED Final   Shigella/Enteroinvasive E coli (EIEC) NOT DETECTED NOT DETECTED Final   Cryptosporidium NOT DETECTED NOT DETECTED Final   Cyclospora cayetanensis NOT DETECTED NOT DETECTED Final   Entamoeba histolytica NOT DETECTED NOT DETECTED Final   Giardia lamblia NOT DETECTED NOT DETECTED Final   Adenovirus F40/41 NOT DETECTED NOT DETECTED Final   Astrovirus NOT DETECTED NOT DETECTED Final   Norovirus GI/GII NOT DETECTED NOT DETECTED Final   Rotavirus A NOT DETECTED NOT DETECTED Final   Sapovirus (I, II, IV, and V) NOT DETECTED NOT DETECTED Final  C difficile quick scan w PCR reflex     Status: Abnormal   Collection Time: 06/29/17  5:31 AM  Result Value Ref Range Status   C Diff antigen POSITIVE (A) NEGATIVE Final   C Diff toxin NEGATIVE NEGATIVE Final   C Diff interpretation Results are indeterminate. See PCR results.  Final  Clostridium Difficile by PCR     Status: Abnormal   Collection Time: 06/29/17  5:31 AM  Result Value Ref Range Status   Toxigenic C Difficile by pcr POSITIVE (A) NEGATIVE Final    Comment: Positive for toxigenic C. difficile with little to no toxin production. Only treat if clinical presentation suggests symptomatic illness. Performed at [facility], [address], [place], [place] [address]      Studies: No results found.  Scheduled Meds: . feeding supplement (GLUCERNA SHAKE)  237 mL Oral TID BM  . feeding supplement (PRO-STAT SUGAR FREE 64)  30 mL Oral Daily  . heparin  5,000 Units Subcutaneous Q8H  . vancomycin  125 mg Oral Q6H   Continuous Infusions: . electrolyte-148 75 mL/hr at 07/03/17 1253  . metronidazole Stopped (07/03/17 1443)    Principal Problem:   Diarrhea of presumed infectious origin Active Problems:   Dementia with aggressive behavior   AKI (acute kidney injury) (HCC)   Palliative care by specialist   Goals of care,  counseling/discussion    Time spent: 25 minutes    Vassie Loll  [facility] Pager [phone]. If 7PM-7AM, please contact night-coverage at [email or web address], password [facility] 07/03/2017, 7:12 PM  LOS: 4 days

## 2017-07-03 NOTE — Consult Note (Signed)
Consultation Note Date: 07/03/2017   Patient Name: Nicole Blake  DOB: Feb 23, 1915  MRN: [id number]  Age / Sex: 82 y.o., female  PCP: [name], MD Referring Physician: Vassie Loll, MD  Reason for Consultation: Establishing goals of care  HPI/Patient Profile: 83 y.o. female  admitted on 06/28/2017    Clinical Assessment and Goals of Care:  Patient is 83 year old lady with a past medical history significant for dementia, hypertension, admitted with diarrhea presumed infectious in origin-being treated for Clostridium difficile infection also noted to have a component of acute kidney injury, hospital course also complicated by dementia with behavioral disturbances, episodic agitation, declining oral intake.  The patient appears to be an elderly lady resting in bed. She is awake and alert but completely nonverbal. At present she is not agitated. She mumbles incoherently.   Call placed and discussed with niece. I introduced myself and palliative care as follows: Palliative medicine is specialized medical care for people living with serious illness. It focuses on providing relief from the symptoms and stress of a serious illness. The goal is to improve quality of life for both the patient and the family.    Patient lives at home with her niece and has been living with her niece for the past 10 years. Niece states that the patient has a palliative care nurse practitioner was been following the patient in the outpatient setting. The patient has had a subacute. Up decline to where over the course of the past few weeks, the patient stopped eating stop drinking stop walking. She was getting more weak. Hence, she was brought into the hospital. Patient's nieces states that the patient does not even have it is for Glucerna anymore and that previously she used to enjoy drinking Glucerna.  We discussed about  dementia disease trajectory in detail. We discussed about appropriate symptom management, appropriate disposition options. Patient's niece becomes tearful over the phone and states that she does not want to place the patient in skilled nursing facility. We discussed about prognosis becoming limited as the patient continues to have minimal-nil oral intake and ongoing dementia with agitation might signal the beginning of the dying process.  See recommendations below. Thank you for the consult.  HCPOA  Niece Nicole Blake [phone] his healthcare power of attorney  SUMMARY OF RECOMMENDATIONS    Agree with DO NOT RESUSCITATE/DO NOT INTUBATE Gentle, safe, hand-assisted oral feeding Monitor disease trajectory Additional family meeting for extensive goals of care discussions scheduled with healthcare power of attorney agent-niece Nicole Blake 01-02-17 between 8:30-9 AM. Further recommendations to follow. Code Status/Advance Care Planning:  DNR    Symptom Management:    Continue current mode of care  Palliative Prophylaxis:   Delirium Protocol   Psycho-social/Spiritual:   Desire for further Chaplaincy support:yes  Additional Recommendations: Education on Hospice  Prognosis:   < 2 weeks  Discharge Planning: To Be Determined      Primary Diagnoses: Present on Admission: . Diarrhea of presumed infectious origin . AKI (acute kidney injury) (HCC) . Dementia without behavioral  disturbance   I have reviewed the medical record, interviewed the patient and family, and examined the patient. The following aspects are pertinent.  Past Medical History:  Diagnosis Date  . Dementia   . Diabetes mellitus without complication (HCC)   . Hypertension    Social History   Social History  . Marital status: Widowed    Spouse name: N/A  . Number of children: N/A  . Years of education: N/A   Social History Main Topics  . Smoking status: Never Smoker  . Smokeless tobacco: Never Used  . Alcohol  use No  . Drug use: Unknown  . Sexual activity: Not Asked   Other Topics Concern  . None   Social History Narrative  . None   History reviewed. No pertinent family history. Scheduled Meds: . feeding supplement (GLUCERNA SHAKE)  237 mL Oral TID BM  . feeding supplement (PRO-STAT SUGAR FREE 64)  30 mL Oral Daily  . heparin  5,000 Units Subcutaneous Q8H  . vancomycin  125 mg Oral Q6H   Continuous Infusions: . electrolyte-148 75 mL/hr at 07/03/17 1253  . metronidazole Stopped (07/03/17 1443)   PRN Meds:.acetaminophen **OR** acetaminophen, ondansetron **OR** ondansetron (ZOFRAN) IV Medications Prior to Admission:  Prior to Admission medications   Medication Sig Start Date End Date Taking? Authorizing Provider  fluticasone (FLONASE) 50 MCG/ACT nasal spray Place 2 sprays into both nostrils daily.   Yes [provider]  polyethylene glycol (MIRALAX / GLYCOLAX) packet Take 17 g by mouth daily as needed for mild constipation.   Yes [provider]  bisacodyl (DULCOLAX) 5 MG EC tablet Take 1 tablet (5 mg total) by mouth daily as needed for moderate constipation. Patient not taking: Reported on 06/28/2017 06/22/16   Joseph Art, DO   Allergies  Allergen Reactions  . Doxycycline Other (See Comments)    Gi- Upset  . Robitussin (Alcohol Free) [Guaifenesin] Other (See Comments)    Stomach upset  . Septra [Sulfamethoxazole-Trimethoprim] Other (See Comments)    Gi- upset  . Sulfa Antibiotics     pts niece believes she may not be able to take this   Review of Systems + agitation At times  Physical Exam Weak frail elderly lady resting in bed S1-S2 Shallow clear breath sounds Abdomen soft Thin, muscle wasting Patient is awake and alert. Does not verbalize at all. Does not appear to be agitated currently  Vital Signs: BP (!) 129/53 (BP Location: Left Arm)   Pulse 73   Temp (!) 97.5 F (36.4 C) (Oral)   Resp (!) 22   Ht  (1.549 m)   Wt 52.1 kg (114 lb 13.8  oz)   SpO2 100%   BMI 21.70 kg/m  Pain Assessment: PAINAD   Pain Score: Asleep   SpO2: SpO2: 100 % O2 Device:SpO2: 100 % O2 Flow Rate: .   IO: Intake/output summary:  Intake/Output Summary (Last 24 hours) at 07/03/17 1747 Last data filed at 07/03/17 1444  Gross per 24 hour  Intake             2340 ml  Output              670 ml  Net             1670 ml    LBM: Last BM Date: 07/02/17 Baseline Weight: Weight: 56.7 kg (125 lb) Most recent weight: Weight: 52.1 kg (114 lb 13.8 oz)     Palliative Assessment/Data:    palliative performance scale  30%  Time In:  1630 Time Out:  1740 Time Total:  70 min  Greater than 50%  of this time was spent counseling and coordinating care related to the above assessment and plan.  Signed by: Rosalin Hawking, MD  [phone]  Please contact Palliative Medicine Team phone at [phone] for questions and concerns.  For individual provider: See Loretha Stapler

## 2017-07-04 DIAGNOSIS — R7989 Other specified abnormal findings of blood chemistry: Secondary | ICD-10-CM

## 2017-07-04 DIAGNOSIS — A419 Sepsis, unspecified organism: Secondary | ICD-10-CM

## 2017-07-04 DIAGNOSIS — R6520 Severe sepsis without septic shock: Secondary | ICD-10-CM

## 2017-07-04 DIAGNOSIS — A0472 Enterocolitis due to Clostridium difficile, not specified as recurrent: Secondary | ICD-10-CM

## 2017-07-04 LAB — BASIC METABOLIC PANEL
Anion gap: 8 (ref 5–15)
BUN: 16 mg/dL (ref 6–20)
CALCIUM: 7.6 mg/dL — AB (ref 8.9–10.3)
CO2: 23 mmol/L (ref 22–32)
CREATININE: 0.82 mg/dL (ref 0.44–1.00)
Chloride: 111 mmol/L (ref 101–111)
GFR calc non Af Amer: 57 mL/min — ABNORMAL LOW (ref >60)
Glucose, Bld: 166 mg/dL — ABNORMAL HIGH (ref 65–99)
Potassium: 3.3 mmol/L — ABNORMAL LOW (ref 3.5–5.1)
SODIUM: 142 mmol/L (ref 135–145)

## 2017-07-04 LAB — CBC
HCT: 35.2 % — ABNORMAL LOW (ref 36.0–46.0)
Hemoglobin: 11.1 g/dL — ABNORMAL LOW (ref 12.0–15.0)
MCH: 27.6 pg (ref 26.0–34.0)
MCHC: 31.5 g/dL (ref 30.0–36.0)
MCV: 87.6 fL (ref 78.0–100.0)
PLATELETS: 269 10*3/uL (ref 150–400)
RBC: 4.02 MIL/uL (ref 3.87–5.11)
RDW: 15.4 % (ref 11.5–15.5)
WBC: 9.3 10*3/uL (ref 4.0–10.5)

## 2017-07-04 LAB — MAGNESIUM: MAGNESIUM: 2.2 mg/dL (ref 1.7–2.4)

## 2017-07-04 MED ORDER — POTASSIUM CHLORIDE CRYS ER 20 MEQ PO TBCR
40.0000 meq | EXTENDED_RELEASE_TABLET | ORAL | Status: AC
  Administered 2017-07-04 (×2): 40 meq via ORAL
  Filled 2017-07-04 (×2): qty 2

## 2017-07-04 NOTE — Progress Notes (Signed)
Daily Progress Note   Patient Name: Nicole Blake       Date: 07/04/2017 DOB: 10/28/1914  Age: 83 y.o. MRN#: [id number] Attending Physician: Barton Dubois, MD Primary Care Physician: Lawerance Cruel, MD Admit Date: 06/28/2017  Reason for Consultation/Follow-up: Establishing goals of care  Subjective:  awake alert Sitting up in bed Not agitated See below:   Length of Stay: 5  Current Medications: Scheduled Meds:  . feeding supplement (GLUCERNA SHAKE)  237 mL Oral TID BM  . feeding supplement (PRO-STAT SUGAR FREE 64)  30 mL Oral Daily  . heparin  5,000 Units Subcutaneous Q8H  . potassium chloride  40 mEq Oral Q4H  . vancomycin  125 mg Oral Q6H    Continuous Infusions: . electrolyte-148 75 mL/hr at 07/03/17 2200  . metronidazole Stopped (07/04/17 0729)    PRN Meds: acetaminophen **OR** acetaminophen, ondansetron **OR** ondansetron (ZOFRAN) IV  Physical Exam         Awake alert Mostly non verbal Recognizes niece in the room Regular Clear Abdomen soft No edema Dementia  Vital Signs: BP (!) 154/74 (BP Location: Left Arm)   Pulse 85   Temp 98.2 F (36.8 C) (Axillary)   Resp 18   Ht 5' 1" (1.549 m)   Wt 52.1 kg (114 lb 13.8 oz)   SpO2 100%   BMI 21.70 kg/m  SpO2: SpO2: 100 % O2 Device: O2 Device: Not Delivered O2 Flow Rate:    Intake/output summary:  Intake/Output Summary (Last 24 hours) at 07/04/17 0954 Last data filed at 07/04/17 0600  Gross per 24 hour  Intake             2200 ml  Output              500 ml  Net             1700 ml   LBM: Last BM Date: 07/03/17 Baseline Weight: Weight: 56.7 kg (125 lb) Most recent weight: Weight: 52.1 kg (114 lb 13.8 oz)      PPS 30% Palliative Assessment/Data:      Patient Active Problem List   Diagnosis  Date Noted  . Palliative care by specialist   . Goals of care, counseling/discussion   . Diarrhea of presumed infectious origin 06/28/2017  . AKI (acute kidney injury) ([place]) 06/28/2017  .  Postoperative anemia due to acute blood loss 07/01/2016  . Edema of left lower extremity 07/01/2016  . Left displaced femoral neck fracture ([place]) 06/20/2016  . Fall 06/19/2016  . Displaced fracture of left femoral neck ([place]) 06/19/2016  . Closed left hip fracture ([place]) 06/19/2016  . Skin tear of forearm without complication 67/08/4579  . Protein-calorie malnutrition, moderate ([place]) 06/19/2016  . Dementia with aggressive behavior 06/19/2016  . Hypertension   . Diabetes mellitus without complication ([place])   . Essential hypertension   . Aspiration pneumonia ([place])   . Nausea and vomiting 12/05/2014  . Emesis 12/05/2014  . CAP (community acquired pneumonia) 12/05/2014    Palliative Care Assessment & Plan   Patient Profile:  Patient is 83 year old lady with a past medical history significant for dementia, hypertension, admitted with diarrhea presumed infectious in origin-being treated for Clostridium difficile infection also noted to have a component of acute kidney injury, hospital course also complicated by dementia with behavioral disturbances, episodic agitation, declining oral intake.   Assessment:  dementia with sub acute decline Declining PO intake Now with new non ambulatory status Agitation at times Recent C Diff Recent AKI  Recommendations/Plan:   Family meeting:  I met with the patient's HCPOA agent niece Lovey Newcomer, who is the patient's primary caregiver, the patient has lived with [place] for more than 10 years, along with a family friend who is an Therapist, sports.   We discussed about dementia stages and trajectory of illness, concepts specific to artificial nutrition and hydration also discussed. Goals wishes and values discussed. All of their questions answered to the best of my ability.    PLAN: DNR DNI Home with hospice Needs hospital bed DME No PEG    Code Status:    Code Status Orders        Start     Ordered   06/29/17 0104  Do not attempt resuscitation (DNR)  Continuous    Question Answer Comment  In the event of cardiac or respiratory ARREST Do not call a "code blue"   In the event of cardiac or respiratory ARREST Do not perform Intubation, CPR, defibrillation or ACLS   In the event of cardiac or respiratory ARREST Use medication by any route, position, wound care, and other measures to relive pain and suffering. May use oxygen, suction and manual treatment of airway obstruction as needed for comfort.      06/29/17 0119    Code Status History    Date Active Date Inactive Code Status Order ID Comments User Context   06/19/2016  8:32 PM 06/22/2016  6:47 PM DNR [id number]  Ivor Costa, MD Inpatient   06/19/2016  7:37 PM 06/19/2016  8:32 PM Full Code [id number]  Ivor Costa, MD ED   12/05/2014  3:54 AM 12/09/2014  5:24 PM Full Code [id number]  Deneise Lever, MD ED    Advance Directive Documentation     Most Recent Value  Type of Advance Directive  Healthcare Power of [place], Living will  Pre-existing out of facility DNR order (yellow form or pink MOST form)  -  "MOST" Form in Place?  -       Prognosis:   < 6 months  Discharge Planning:  Home with Hospice  Care plan was discussed with  Patient's niece [name], patient's friend, Therapist, sports.   Thank you for allowing the Palliative Medicine Team to assist in the care of this patient.   Time In:  9 Time Out: 10 Total Time 60 Prolonged Time Billed  yes       Greater than 50%  of this time was spent counseling and coordinating care related to the above assessment and plan.  Loistine Chance, MD [phone]  Please contact Palliative Medicine Team phone at [phone] for questions and concerns.

## 2017-07-04 NOTE — Progress Notes (Signed)
[facility] PROGRESS NOTE  Nicole Blake NUU:[id number] DOB: 12-21-1914 DOA: 06/28/2017 PCP: [name], MD  Interim summary and HPI 83 y.o.femalewith medical history significant of Dementia, HTN, on pal care for dementia although not hospice yet since she was still feeding self up until last couple of days. At baseline can say a few words but not talk in complete sentences.  Patient brought in by neicefor persistent diarrhea for past 8 days. 3-4 foul smelling watery BMs per day. Is cared for at home by daughter, and has no history of ABx recently. Found to be positive for C. Diff infection (community acquired)  Assessment/Plan: 1-Sepsis due to C. Diff colitis: -no fever, WBC's now WNL -non-toxic in appearance -will give another 24 hours of IV flagyl and then complete therapy with oral vancomycin. -GOC discussion with POA resulted in hospice care at home. -patient will needs home equipment prior to discharge  -continue supportive care and after completing antibiotic therapy will focus on symptomatic management  2-metabolic acidosis -due to diarrhea -has improved/resolved with IVF's and electrolytes repletion -will monitor intermittently -patient encourage to increase PO intake  3-aki -pre-renal in nature -improved/resolved with fluid resuscitation -will monitor trend intermittently  4-protein calorie malnutrition: severe -will continue encouraging PO intake and continue using feeding supplements  5-dementia w/o behavioral disturbances -continue supportive care  Code Status: DNR/DNI Family Communication: no family at bedside  Disposition Plan: will continue current IV and oral antibiotics, family meeting happened today and the decision was made for hospice care at home. Continue supportive care and finish current antibiotic therapy.    Consultants:  palliative care   Procedures:  See below for x-ray reports   Antibiotics:  Vancomycin  Flagyl    HPI/Subjective: No fever, no CP, no SOB. Appears comfortable overall and no overnight events. Patient continue to have loose stools, but less episodes daily.   Objective: Vitals:   07/04/17 0631 07/04/17 1400  BP: (!) 154/74 (!) 145/73  Pulse: 85 82  Resp: 18 17  Temp: 98.2 F (36.8 C) 98 F (36.7 C)  SpO2: 100% 98%    Intake/Output Summary (Last 24 hours) at 07/04/17 1741 Last data filed at 07/04/17 1728  Gross per 24 hour  Intake             1300 ml  Output              400 ml  Net              900 ml   Filed Weights   06/28/17 2102 06/29/17 0233  Weight: 56.7 kg (125 lb) 52.1 kg (114 lb 13.8 oz)    Exam:   General: afebrile, oriented x1, no CP, no abd pain, no nausea, no vomiting. still with very poor intake. Still with loose stools, but less episodes per day and more form according to niece at bedside.  Cardiovascular: S1 and S2, positive SEM, no rubs, no gallops.  Respiratory: CTA bilaterally, normal resp effort.   Abdomen: soft, NT, ND, positive BS.   Musculoskeletal: no edema, no cyanosis   Data Reviewed: Basic Metabolic Panel:  Recent Labs Lab 06/28/17 2230 06/29/17 0224 06/30/17 0529 07/04/17 0604  NA 141 143 142 142  K 4.5 4.2 3.5 3.3*  CL 111 114* 113* 111  CO2 18* 21* 19* 23  GLUCOSE 272* 217* 162* 166*  BUN 63* 62* 44* 16  CREATININE 2.09* 1.84* 1.24* 0.82  CALCIUM 8.3* 7.9* 7.7* 7.6*  MG  --   --   --  2.2   Liver Function Tests:  Recent Labs Lab 06/28/17 2230  AST 13*  ALT 10*  ALKPHOS 86  BILITOT 0.3  PROT 5.4*  ALBUMIN 2.4*   CBC:  Recent Labs Lab 06/28/17 2230 06/29/17 0224 06/30/17 0529 07/04/17 0604  WBC 18.7* 14.3* 12.1* 9.3  NEUTROABS 15.8*  --   --   --   HGB 11.8* 11.1* 10.8* 11.1*  HCT 36.6 34.6* 33.6* 35.2*  MCV 86.9 85.2 87.0 87.6  PLT 259 234 241 269    CBG: No results for input(s): GLUCAP in the last 168 hours.  Recent Results (from the past 240 hour(s))  Gastrointestinal Panel by PCR , Stool      Status: None   Collection Time: 06/29/17  5:31 AM  Result Value Ref Range Status   Campylobacter species NOT DETECTED NOT DETECTED Final   Plesimonas shigelloides NOT DETECTED NOT DETECTED Final   Salmonella species NOT DETECTED NOT DETECTED Final   Yersinia enterocolitica NOT DETECTED NOT DETECTED Final   Vibrio species NOT DETECTED NOT DETECTED Final   Vibrio cholerae NOT DETECTED NOT DETECTED Final   Enteroaggregative E coli (EAEC) NOT DETECTED NOT DETECTED Final   Enteropathogenic E coli (EPEC) NOT DETECTED NOT DETECTED Final   Enterotoxigenic E coli (ETEC) NOT DETECTED NOT DETECTED Final   Shiga like toxin producing E coli (STEC) NOT DETECTED NOT DETECTED Final   Shigella/Enteroinvasive E coli (EIEC) NOT DETECTED NOT DETECTED Final   Cryptosporidium NOT DETECTED NOT DETECTED Final   Cyclospora cayetanensis NOT DETECTED NOT DETECTED Final   Entamoeba histolytica NOT DETECTED NOT DETECTED Final   Giardia lamblia NOT DETECTED NOT DETECTED Final   Adenovirus F40/41 NOT DETECTED NOT DETECTED Final   Astrovirus NOT DETECTED NOT DETECTED Final   Norovirus GI/GII NOT DETECTED NOT DETECTED Final   Rotavirus A NOT DETECTED NOT DETECTED Final   Sapovirus (I, II, IV, and V) NOT DETECTED NOT DETECTED Final  C difficile quick scan w PCR reflex     Status: Abnormal   Collection Time: 06/29/17  5:31 AM  Result Value Ref Range Status   C Diff antigen POSITIVE (A) NEGATIVE Final   C Diff toxin NEGATIVE NEGATIVE Final   C Diff interpretation Results are indeterminate. See PCR results.  Final  Clostridium Difficile by PCR     Status: Abnormal   Collection Time: 06/29/17  5:31 AM  Result Value Ref Range Status   Toxigenic C Difficile by pcr POSITIVE (A) NEGATIVE Final    Comment: Positive for toxigenic C. difficile with little to no toxin production. Only treat if clinical presentation suggests symptomatic illness. Performed at [facility], [address], [place], [place] [address]       Studies: No results found.  Scheduled Meds: . feeding supplement (GLUCERNA SHAKE)  237 mL Oral TID BM  . feeding supplement (PRO-STAT SUGAR FREE 64)  30 mL Oral Daily  . heparin  5,000 Units Subcutaneous Q8H  . vancomycin  125 mg Oral Q6H   Continuous Infusions: . electrolyte-148 75 mL (07/04/17 1722)  . metronidazole Stopped (07/04/17 1527)    Principal Problem:   Diarrhea of presumed infectious origin Active Problems:   Dementia with aggressive behavior   AKI (acute kidney injury) [facility])   Palliative care by specialist   Goals of care, counseling/discussion    Time spent: 25 minutes    Vassie Loll  [facility] Pager [phone]. If 7PM-7AM, please contact night-coverage at [email or web address], password [facility] 07/04/2017, 5:41 PM  LOS: 5 days

## 2017-07-05 DIAGNOSIS — F0391 Unspecified dementia with behavioral disturbance: Secondary | ICD-10-CM

## 2017-07-05 NOTE — Progress Notes (Signed)
[facility] PROGRESS NOTE  Nicole Blake:[id number] DOB: 29-Nov-1914 DOA: 06/28/2017 PCP: [name], MD  Interim summary and HPI 83 y.o.femalewith medical history significant of Dementia, HTN, on pal care for dementia although not hospice yet since she was still feeding self up until last couple of days. At baseline can say a few words but not talk in complete sentences.  Patient brought in by neicefor persistent diarrhea for past 8 days. 3-4 foul smelling watery BMs per day. Is cared for at home by daughter, and has no history of ABx recently. Found to be positive for C. Diff infection (community acquired)  Assessment/Plan: 1-Sepsis due to C. Diff colitis: -no fever, WBC's now WNL -non-toxic in appearance -will conclude IV flagyl therapy and continue oral vancomycin to complete therapy.  -GOC discussion with POA resulted in hospice care at home. -patient will need home equipment prior to discharge; CM on board and aware  -continue supportive care and after completing antibiotic therapy plan is to focus mainly on symptomatic management  2-metabolic acidosis -due to diarrhea -has improved/resolved with IVF's and electrolytes repletion -patient encourage to increase PO intake -loose stools continue slowing down now.  3-aki -pre-renal in nature -resolved with fluid resuscitation -will d/c IVF's today  4-protein calorie malnutrition: severe -continue to encourage PO intake and will follow feeding supplement.   5-dementia w/o behavioral disturbances -continue supportive care -stable overall and no agitation.  Code Status: DNR/DNI Family Communication: no family at bedside  Disposition Plan: will provide extra 24 hours of flagyl and the discharge home with hospice (as determined by [facility] meeting) tomorrow morning. CM arranging home hospice services and equipment.   Consultants:  palliative care   Procedures:  See below for x-ray reports    Antibiotics:  Vancomycin  Flagyl   HPI/Subjective: No fever, no CP, no SOB, no nausea, no vomiting. Patient still with poor PO intake.  Objective: Vitals:   07/04/17 2200 07/05/17 0558  BP: (!) 127/91 (!) 164/68  Pulse: 74 72  Resp: 18 17  Temp: 97.7 F (36.5 C) 98.1 F (36.7 C)  SpO2: 97% 100%    Intake/Output Summary (Last 24 hours) at 07/05/17 1333 Last data filed at 07/05/17 1114  Gross per 24 hour  Intake             2300 ml  Output             1225 ml  Net             1075 ml   Filed Weights   06/28/17 2102 06/29/17 0233  Weight: 56.7 kg (125 lb) 52.1 kg (114 lb 13.8 oz)    Exam:   General: no fever, no CP, no SOB, no nausea, no vomiting. Intake continue to be poor. Patient continue to have intermittent episodes of loose stools.   Rest of physical exam unchanged from 07/04/17; please se below for details:   Cardiovascular: S1 and S2, positive SEM, no rubs, no gallops.  Respiratory: CTA bilaterally, normal resp effort.   Abdomen: soft, NT, ND, positive BS.   Musculoskeletal: no edema, no cyanosis   Data Reviewed: Basic Metabolic Panel:  Recent Labs Lab 06/28/17 2230 06/29/17 0224 06/30/17 0529 07/04/17 0604  NA 141 143 142 142  K 4.5 4.2 3.5 3.3*  CL 111 114* 113* 111  CO2 18* 21* 19* 23  GLUCOSE 272* 217* 162* 166*  BUN 63* 62* 44* 16  CREATININE 2.09* 1.84* 1.24* 0.82  CALCIUM 8.3* 7.9* 7.7*  7.6*  MG  --   --   --  2.2   Liver Function Tests:  Recent Labs Lab 06/28/17 2230  AST 13*  ALT 10*  ALKPHOS 86  BILITOT 0.3  PROT 5.4*  ALBUMIN 2.4*   CBC:  Recent Labs Lab 06/28/17 2230 06/29/17 0224 06/30/17 0529 07/04/17 0604  WBC 18.7* 14.3* 12.1* 9.3  NEUTROABS 15.8*  --   --   --   HGB 11.8* 11.1* 10.8* 11.1*  HCT 36.6 34.6* 33.6* 35.2*  MCV 86.9 85.2 87.0 87.6  PLT 259 234 241 269    CBG: No results for input(s): GLUCAP in the last 168 hours.  Recent Results (from the past 240 hour(s))  Gastrointestinal Panel by  PCR , Stool     Status: None   Collection Time: 06/29/17  5:31 AM  Result Value Ref Range Status   Campylobacter species NOT DETECTED NOT DETECTED Final   Plesimonas shigelloides NOT DETECTED NOT DETECTED Final   Salmonella species NOT DETECTED NOT DETECTED Final   Yersinia enterocolitica NOT DETECTED NOT DETECTED Final   Vibrio species NOT DETECTED NOT DETECTED Final   Vibrio cholerae NOT DETECTED NOT DETECTED Final   Enteroaggregative E coli (EAEC) NOT DETECTED NOT DETECTED Final   Enteropathogenic E coli (EPEC) NOT DETECTED NOT DETECTED Final   Enterotoxigenic E coli (ETEC) NOT DETECTED NOT DETECTED Final   Shiga like toxin producing E coli (STEC) NOT DETECTED NOT DETECTED Final   Shigella/Enteroinvasive E coli (EIEC) NOT DETECTED NOT DETECTED Final   Cryptosporidium NOT DETECTED NOT DETECTED Final   Cyclospora cayetanensis NOT DETECTED NOT DETECTED Final   Entamoeba histolytica NOT DETECTED NOT DETECTED Final   Giardia lamblia NOT DETECTED NOT DETECTED Final   Adenovirus F40/41 NOT DETECTED NOT DETECTED Final   Astrovirus NOT DETECTED NOT DETECTED Final   Norovirus GI/GII NOT DETECTED NOT DETECTED Final   Rotavirus A NOT DETECTED NOT DETECTED Final   Sapovirus (I, II, IV, and V) NOT DETECTED NOT DETECTED Final  C difficile quick scan w PCR reflex     Status: Abnormal   Collection Time: 06/29/17  5:31 AM  Result Value Ref Range Status   C Diff antigen POSITIVE (A) NEGATIVE Final   C Diff toxin NEGATIVE NEGATIVE Final   C Diff interpretation Results are indeterminate. See PCR results.  Final  Clostridium Difficile by PCR     Status: Abnormal   Collection Time: 06/29/17  5:31 AM  Result Value Ref Range Status   Toxigenic C Difficile by pcr POSITIVE (A) NEGATIVE Final    Comment: Positive for toxigenic C. difficile with little to no toxin production. Only treat if clinical presentation suggests symptomatic illness. Performed at [facility], [address], [place],  [place] [address]      Studies: No results found.  Scheduled Meds: . feeding supplement (GLUCERNA SHAKE)  237 mL Oral TID BM  . feeding supplement (PRO-STAT SUGAR FREE 64)  30 mL Oral Daily  . heparin  5,000 Units Subcutaneous Q8H  . vancomycin  125 mg Oral Q6H   Continuous Infusions: . electrolyte-148 75 mL/hr at 07/05/17 0658  . metronidazole Stopped (07/05/17 0710)    Principal Problem:   Diarrhea of presumed infectious origin Active Problems:   Dementia with aggressive behavior   AKI (acute kidney injury) [facility])   Palliative care by specialist   Goals of care, counseling/discussion    Time spent: 25 minutes    Vassie Loll  [facility] Pager [phone].  If 7PM-7AM, please contact night-coverage at [email or web address], password [facility] 07/05/2017, 1:33 PM  LOS: 6 days

## 2017-07-05 NOTE — Progress Notes (Signed)
[facility])  Received request from [facility] Arna Medici for family interest is [facility] services at home after discharge. Chart reviewed and hospice eligibility has been confirmed my [facility] medical director.   Spoke with daughter Andrey Campanile by phone to initiate education related to hospice philosophy, services and team approach to care. Sandy expressed understanding of information provided. Per [facility] and confirmed by Andrey Campanile, plan is to return home tomorrow via [facility].  DME needs discussed. Sandy requested hospital bed with overbed table which has been ordered and will be delivered by [facility] this evening. [facility] has already contacted [facility].   Please send signed and completed out of facility DNR with patient.   Please send scripts for any medication patient does not already have.   [facility] is aware of the above. They have already contacted Sandy to schedule Admission Visit at home after discharge tomorrow.   Please call with hospice related questions.   Thank you,  Forrestine Him, LCSW [phone]  [facility] hospital liaisons are listed on AMION.

## 2017-07-05 NOTE — Care Management Important Message (Signed)
Important Message  Patient Details IM Letter given to [name] to present to Patient Name: Nicole Blake MRN: [id number] Date of Birth: 1915-01-08   Medicare Important Message Given:  Yes    Caren Macadam 07/05/2017, 10:21 AM

## 2017-07-05 NOTE — Progress Notes (Signed)
CM consult for home with hospice. This CM spoke with pt niece Andrey Campanile [facility]) to confirm plan for home with hospice. Pt was receiving home palliative care services from [facility] and would like to switch to full hospice at home. [facility] contacted and given home hospice referral. Andrey Campanile requesting a hospital bed, and over bed table and ambulance for home. CM will continue to follow and assist as able. Sandford Craze RN,BSN,NCM [phone]

## 2017-07-06 DIAGNOSIS — E872 Acidosis, unspecified: Secondary | ICD-10-CM

## 2017-07-06 DIAGNOSIS — A419 Sepsis, unspecified organism: Secondary | ICD-10-CM

## 2017-07-06 DIAGNOSIS — R7989 Other specified abnormal findings of blood chemistry: Secondary | ICD-10-CM

## 2017-07-06 DIAGNOSIS — A0472 Enterocolitis due to Clostridium difficile, not specified as recurrent: Secondary | ICD-10-CM

## 2017-07-06 DIAGNOSIS — E860 Dehydration: Secondary | ICD-10-CM

## 2017-07-06 MED ORDER — GLUCERNA SHAKE PO LIQD: 237.0000 mL | Freq: Three times a day (TID) | ORAL | 0 refills | Status: AC

## 2017-07-06 MED ORDER — ACETAMINOPHEN 325 MG PO TABS: 650.0000 mg | ORAL_TABLET | Freq: Four times a day (QID) | ORAL | 0 refills | Status: AC | PRN

## 2017-07-06 MED ORDER — ONDANSETRON 8 MG PO TBDP: 8.0000 mg | ORAL_TABLET | Freq: Three times a day (TID) | ORAL | 0 refills | Status: AC | PRN

## 2017-07-06 MED ORDER — VANCOMYCIN 50 MG/ML ORAL SOLUTION: 125.0000 mg | Freq: Four times a day (QID) | ORAL | 0 refills | Status: AC

## 2017-07-06 MED ORDER — MORPHINE SULFATE 10 MG/5ML PO SOLN: 2.5000 mg | Freq: Four times a day (QID) | ORAL | 0 refills | Status: AC | PRN

## 2017-07-06 MED FILL — VANCOMYCIN 50MG/ML-ORAL SOL: 7 days supply | Qty: 80 | Fill #0

## 2017-07-06 NOTE — Progress Notes (Signed)
This CM filled out Medical Necessity Form and printed it along with Demographic sheet for [facility]. Staff RN to call [facility] when pt ready for DC. Sandford Craze RN,BSN,NCM [phone]

## 2017-07-06 NOTE — Progress Notes (Signed)
Discharge instructions reviewed with patients niece Andrey Campanile, [place] called for transport.  Quintavious Rinck RN

## 2017-07-06 NOTE — Discharge Summary (Signed)
Physician Discharge Summary  Nicole Blake:[id number] DOB: Jul 02, 1915 DOA: 06/28/2017  PCP: [name], MD  Admit date: 06/28/2017 Discharge date: 07/06/2017  Time spent: 35 minutes  Recommendations for Outpatient Follow-up:  Complete antibiotic by mouth as instructed Follow recommendations from hospice care group Maintain adequate hydration and encourage PO intake/use of feeding supplement  Main goal is to keep patient comfortable Avoid future hospitalizations and transition to full comfort care as needed.  Discharge Diagnoses:  Principal Problem:   Diarrhea of presumed infectious origin Active Problems:   Dementia with aggressive behavior   AKI (acute kidney injury) (HCC)   Palliative care by specialist   Goals of care, counseling/discussion   Dehydration   Elevated lactic acid level   Metabolic acidosis   Sepsis (HCC)   C. difficile enteritis   Discharge Condition: stable and comfortable. Discharge home with hospice care.  Diet recommendation: regular diet/comfort feeding   Filed Weights   06/28/17 2102 06/29/17 0233  Weight: 56.7 kg (125 lb) 52.1 kg (114 lb 13.8 oz)    History of present illness:  83 y.o.femalewith medical history significant of Dementia, HTN, on pal care for dementia although not hospice yet since she was still feeding herself up until last couple of days. At baseline can say a few words but not talk in complete sentences. Patient brought in by neicefor persistent diarrhea for past 8 days. 3-4 foul smelling watery BMs per day. Is cared for at home by Niece, and has no history of recent US of antibiotics. Found to be positive for C. Diff infection (community acquired).  Hospital Course:  1-Sepsis due to C. Diff colitis: -no fever, WBC's now WNL -non-toxic in appearance at discharge -patient finish IV flagyl while inpatient and has been discharge with oral vancomycin prescription to complete 7 more days of therapy. -GOC discussion  with POA resulted in decision to pursuit hospice care at home. -patient's home equipment needs fulfilled prior to discharge  -continue supportive care and after completing antibiotic therapy plan is to focus mainly on symptomatic management.  -avoid future hospitalizations.  2-metabolic acidosis -due to diarrhea -has improved/resolved with IVF's and electrolytes repletion -patient encourage to increase PO intake -loose stools continue improving and are less more frequent now.  3-aki -pre-renal in nature -resolved with fluid resuscitation -will encourage patient and family to assist maintaining adequate hydration   4-protein calorie malnutrition: severe -continue to encourage PO intake and will follow feeding supplement.   5-dementia w/o behavioral disturbances -continue supportive care -stable overall and no agitation.  Procedures:  See below for x-ray reports   Consultations:  Palliative care   Discharge Exam: Vitals:   07/05/17 2153 07/06/17 0632  BP: (!) 156/91 (!) 149/72  Pulse: 98 82  Resp: 16 18  Temp: 97.7 F (36.5 C) 97.9 F (36.6 C)  SpO2:  100%    General: no fever, no CP, no SOB, no nausea, no vomiting. Intake continue to be poor. Patient continue to have intermittent episodes of loose stools.   Cardiovascular: S1 and S2, positive SEM, no rubs, no gallops.  Respiratory: CTA bilaterally, normal resp effort.   Abdomen: soft, NT, ND, positive BS.   Musculoskeletal: no edema, no cyanosis    Discharge Instructions   Discharge Instructions    Discharge instructions    Complete by:  As directed    Complete antibiotic by mouth as instructed Follow recommendations from hospice care group Maintain adequate hydration and encourage PO intake/use of feeding supplement  Main goal  is to keep patient comfortable     Current Discharge Medication List    START taking these medications   Details  acetaminophen (TYLENOL) 325 MG tablet Take 2 tablets  (650 mg total) by mouth every 6 (six) hours as needed for mild pain (or Fever >/= 101). Qty: 40 tablet, Refills: 0    feeding supplement, GLUCERNA SHAKE, (GLUCERNA SHAKE) LIQD Take 237 mLs by mouth 3 (three) times daily between meals. Refills: 0    morphine 10 MG/5ML solution Take 1.3-2.5 mLs (2.6-5 mg total) by mouth every 6 (six) hours as needed for severe pain (comfort). Qty: 30 mL, Refills: 0    ondansetron (ZOFRAN ODT) 8 MG disintegrating tablet Take 1 tablet (8 mg total) by mouth every 8 (eight) hours as needed for nausea or vomiting. Qty: 20 tablet, Refills: 0    vancomycin (VANCOCIN) 50 mg/mL oral solution Take 2.5 mLs (125 mg total) by mouth every 6 (six) hours. Qty: 70 mL, Refills: 0      CONTINUE these medications which have NOT CHANGED   Details  fluticasone (FLONASE) 50 MCG/ACT nasal spray Place 2 sprays into both nostrils daily.      STOP taking these medications     polyethylene glycol (MIRALAX / GLYCOLAX) packet      bisacodyl (DULCOLAX) 5 MG EC tablet        Allergies  Allergen Reactions  . Doxycycline Other (See Comments)    Gi- Upset  . Robitussin (Alcohol Free) [Guaifenesin] Other (See Comments)    Stomach upset  . Septra [Sulfamethoxazole-Trimethoprim] Other (See Comments)    Gi- upset  . Sulfa Antibiotics     pts niece believes she may not be able to take this   Follow-up Information    [name], MD. Schedule an appointment as soon as possible for a visit in 2 week(s).   Specialty:  Family Medicine Contact information: [address] [place] [address] [phone]            The results of significant diagnostics from this hospitalization (including imaging, microbiology, ancillary and laboratory) are listed below for reference.    Significant Diagnostic Studies: Dg Chest 2 View  Result Date: 06/28/2017 CLINICAL DATA:  Shortness of breath on exertion, diarrhea, history of aspiration pneumonia EXAM: CHEST  2 VIEW  COMPARISON:  06/19/2016 FINDINGS: Shallow inspiration with linear atelectasis in the lung bases. No airspace disease or consolidation. No blunting of costophrenic angles. No pneumothorax. Heart size and pulmonary vascularity are normal. Calcified and tortuous aorta. Surgical clips in the right upper quadrant. Degenerative changes in the spine and shoulders. Multiple vertebral compression deformities are poorly visualized due to bone demineralization but appear unchanged. Changes likely represent osteoporosis. IMPRESSION: Shallow inspiration with linear atelectasis in the lung bases. No evidence of active pulmonary disease. Aortic atherosclerosis. Bone demineralization with multiple vertebral compression deformities, likely osteoporosis. Electronically Signed   By: Burman Nieves M.D.   On: 06/28/2017 22:29    Microbiology: Recent Results (from the past 240 hour(s))  Gastrointestinal Panel by PCR , Stool     Status: None   Collection Time: 06/29/17  5:31 AM  Result Value Ref Range Status   Campylobacter species NOT DETECTED NOT DETECTED Final   Plesimonas shigelloides NOT DETECTED NOT DETECTED Final   Salmonella species NOT DETECTED NOT DETECTED Final   Yersinia enterocolitica NOT DETECTED NOT DETECTED Final   Vibrio species NOT DETECTED NOT DETECTED Final   Vibrio cholerae NOT DETECTED NOT DETECTED Final  Enteroaggregative E coli (EAEC) NOT DETECTED NOT DETECTED Final   Enteropathogenic E coli (EPEC) NOT DETECTED NOT DETECTED Final   Enterotoxigenic E coli (ETEC) NOT DETECTED NOT DETECTED Final   Shiga like toxin producing E coli (STEC) NOT DETECTED NOT DETECTED Final   Shigella/Enteroinvasive E coli (EIEC) NOT DETECTED NOT DETECTED Final   Cryptosporidium NOT DETECTED NOT DETECTED Final   Cyclospora cayetanensis NOT DETECTED NOT DETECTED Final   Entamoeba histolytica NOT DETECTED NOT DETECTED Final   Giardia lamblia NOT DETECTED NOT DETECTED Final   Adenovirus F40/41 NOT DETECTED NOT  DETECTED Final   Astrovirus NOT DETECTED NOT DETECTED Final   Norovirus GI/GII NOT DETECTED NOT DETECTED Final   Rotavirus A NOT DETECTED NOT DETECTED Final   Sapovirus (I, II, IV, and V) NOT DETECTED NOT DETECTED Final  C difficile quick scan w PCR reflex     Status: Abnormal   Collection Time: 06/29/17  5:31 AM  Result Value Ref Range Status   C Diff antigen POSITIVE (A) NEGATIVE Final   C Diff toxin NEGATIVE NEGATIVE Final   C Diff interpretation Results are indeterminate. See PCR results.  Final  Clostridium Difficile by PCR     Status: Abnormal   Collection Time: 06/29/17  5:31 AM  Result Value Ref Range Status   Toxigenic C Difficile by pcr POSITIVE (A) NEGATIVE Final    Comment: Positive for toxigenic C. difficile with little to no toxin production. Only treat if clinical presentation suggests symptomatic illness. Performed at [facility], [address], [place], [place] [address]      Labs: Basic Metabolic Panel:  Recent Labs Lab 06/30/17 0529 07/04/17 0604  NA 142 142  K 3.5 3.3*  CL 113* 111  CO2 19* 23  GLUCOSE 162* 166*  BUN 44* 16  CREATININE 1.24* 0.82  CALCIUM 7.7* 7.6*  MG  --  2.2   CBC:  Recent Labs Lab 06/30/17 0529 07/04/17 0604  WBC 12.1* 9.3  HGB 10.8* 11.1*  HCT 33.6* 35.2*  MCV 87.0 87.6  PLT 241 269    Signed:  Vassie Loll MD.  [facility] 07/06/2017, 8:56 AM

## 2017-10-29 DIAGNOSIS — G9340 Encephalopathy, unspecified: Secondary | ICD-10-CM | POA: Diagnosis not present

## 2017-10-29 DIAGNOSIS — E46 Unspecified protein-calorie malnutrition: Secondary | ICD-10-CM | POA: Diagnosis not present

## 2017-10-29 DIAGNOSIS — I10 Essential (primary) hypertension: Secondary | ICD-10-CM | POA: Diagnosis not present

## 2017-10-29 DIAGNOSIS — K529 Noninfective gastroenteritis and colitis, unspecified: Secondary | ICD-10-CM | POA: Diagnosis not present

## 2017-10-29 DIAGNOSIS — G309 Alzheimer's disease, unspecified: Secondary | ICD-10-CM | POA: Diagnosis not present

## 2017-10-29 DIAGNOSIS — E119 Type 2 diabetes mellitus without complications: Secondary | ICD-10-CM | POA: Diagnosis not present

## 2017-10-29 DIAGNOSIS — A419 Sepsis, unspecified organism: Secondary | ICD-10-CM | POA: Diagnosis not present

## 2017-10-29 DIAGNOSIS — J690 Pneumonitis due to inhalation of food and vomit: Secondary | ICD-10-CM | POA: Diagnosis not present

## 2017-10-29 DIAGNOSIS — F0281 Dementia in other diseases classified elsewhere with behavioral disturbance: Secondary | ICD-10-CM | POA: Diagnosis not present

## 2017-10-29 DIAGNOSIS — R1310 Dysphagia, unspecified: Secondary | ICD-10-CM | POA: Diagnosis not present

## 2017-11-01 DIAGNOSIS — G309 Alzheimer's disease, unspecified: Secondary | ICD-10-CM | POA: Diagnosis not present

## 2017-11-01 DIAGNOSIS — R1310 Dysphagia, unspecified: Secondary | ICD-10-CM | POA: Diagnosis not present

## 2017-11-01 DIAGNOSIS — J690 Pneumonitis due to inhalation of food and vomit: Secondary | ICD-10-CM | POA: Diagnosis not present

## 2017-11-01 DIAGNOSIS — A419 Sepsis, unspecified organism: Secondary | ICD-10-CM | POA: Diagnosis not present

## 2017-11-01 DIAGNOSIS — G9340 Encephalopathy, unspecified: Secondary | ICD-10-CM | POA: Diagnosis not present

## 2017-11-01 DIAGNOSIS — K529 Noninfective gastroenteritis and colitis, unspecified: Secondary | ICD-10-CM | POA: Diagnosis not present

## 2017-11-02 DIAGNOSIS — A419 Sepsis, unspecified organism: Secondary | ICD-10-CM | POA: Diagnosis not present

## 2017-11-02 DIAGNOSIS — R1310 Dysphagia, unspecified: Secondary | ICD-10-CM | POA: Diagnosis not present

## 2017-11-02 DIAGNOSIS — G309 Alzheimer's disease, unspecified: Secondary | ICD-10-CM | POA: Diagnosis not present

## 2017-11-02 DIAGNOSIS — G9340 Encephalopathy, unspecified: Secondary | ICD-10-CM | POA: Diagnosis not present

## 2017-11-02 DIAGNOSIS — J690 Pneumonitis due to inhalation of food and vomit: Secondary | ICD-10-CM | POA: Diagnosis not present

## 2017-11-02 DIAGNOSIS — K529 Noninfective gastroenteritis and colitis, unspecified: Secondary | ICD-10-CM | POA: Diagnosis not present

## 2017-11-03 DIAGNOSIS — G309 Alzheimer's disease, unspecified: Secondary | ICD-10-CM | POA: Diagnosis not present

## 2017-11-03 DIAGNOSIS — K529 Noninfective gastroenteritis and colitis, unspecified: Secondary | ICD-10-CM | POA: Diagnosis not present

## 2017-11-03 DIAGNOSIS — R1310 Dysphagia, unspecified: Secondary | ICD-10-CM | POA: Diagnosis not present

## 2017-11-03 DIAGNOSIS — J690 Pneumonitis due to inhalation of food and vomit: Secondary | ICD-10-CM | POA: Diagnosis not present

## 2017-11-03 DIAGNOSIS — A419 Sepsis, unspecified organism: Secondary | ICD-10-CM | POA: Diagnosis not present

## 2017-11-03 DIAGNOSIS — G9340 Encephalopathy, unspecified: Secondary | ICD-10-CM | POA: Diagnosis not present

## 2017-11-04 DIAGNOSIS — G9340 Encephalopathy, unspecified: Secondary | ICD-10-CM | POA: Diagnosis not present

## 2017-11-04 DIAGNOSIS — J690 Pneumonitis due to inhalation of food and vomit: Secondary | ICD-10-CM | POA: Diagnosis not present

## 2017-11-04 DIAGNOSIS — A419 Sepsis, unspecified organism: Secondary | ICD-10-CM | POA: Diagnosis not present

## 2017-11-04 DIAGNOSIS — K529 Noninfective gastroenteritis and colitis, unspecified: Secondary | ICD-10-CM | POA: Diagnosis not present

## 2017-11-04 DIAGNOSIS — G309 Alzheimer's disease, unspecified: Secondary | ICD-10-CM | POA: Diagnosis not present

## 2017-11-04 DIAGNOSIS — R1310 Dysphagia, unspecified: Secondary | ICD-10-CM | POA: Diagnosis not present

## 2017-11-09 DIAGNOSIS — R1310 Dysphagia, unspecified: Secondary | ICD-10-CM | POA: Diagnosis not present

## 2017-11-09 DIAGNOSIS — A419 Sepsis, unspecified organism: Secondary | ICD-10-CM | POA: Diagnosis not present

## 2017-11-09 DIAGNOSIS — K529 Noninfective gastroenteritis and colitis, unspecified: Secondary | ICD-10-CM | POA: Diagnosis not present

## 2017-11-09 DIAGNOSIS — J690 Pneumonitis due to inhalation of food and vomit: Secondary | ICD-10-CM | POA: Diagnosis not present

## 2017-11-09 DIAGNOSIS — G9340 Encephalopathy, unspecified: Secondary | ICD-10-CM | POA: Diagnosis not present

## 2017-11-09 DIAGNOSIS — G309 Alzheimer's disease, unspecified: Secondary | ICD-10-CM | POA: Diagnosis not present

## 2017-11-10 DIAGNOSIS — G9340 Encephalopathy, unspecified: Secondary | ICD-10-CM | POA: Diagnosis not present

## 2017-11-10 DIAGNOSIS — A419 Sepsis, unspecified organism: Secondary | ICD-10-CM | POA: Diagnosis not present

## 2017-11-10 DIAGNOSIS — K529 Noninfective gastroenteritis and colitis, unspecified: Secondary | ICD-10-CM | POA: Diagnosis not present

## 2017-11-10 DIAGNOSIS — G309 Alzheimer's disease, unspecified: Secondary | ICD-10-CM | POA: Diagnosis not present

## 2017-11-10 DIAGNOSIS — J690 Pneumonitis due to inhalation of food and vomit: Secondary | ICD-10-CM | POA: Diagnosis not present

## 2017-11-10 DIAGNOSIS — R1310 Dysphagia, unspecified: Secondary | ICD-10-CM | POA: Diagnosis not present

## 2017-11-16 DIAGNOSIS — R1310 Dysphagia, unspecified: Secondary | ICD-10-CM | POA: Diagnosis not present

## 2017-11-16 DIAGNOSIS — A419 Sepsis, unspecified organism: Secondary | ICD-10-CM | POA: Diagnosis not present

## 2017-11-16 DIAGNOSIS — K529 Noninfective gastroenteritis and colitis, unspecified: Secondary | ICD-10-CM | POA: Diagnosis not present

## 2017-11-16 DIAGNOSIS — J690 Pneumonitis due to inhalation of food and vomit: Secondary | ICD-10-CM | POA: Diagnosis not present

## 2017-11-16 DIAGNOSIS — G309 Alzheimer's disease, unspecified: Secondary | ICD-10-CM | POA: Diagnosis not present

## 2017-11-16 DIAGNOSIS — G9340 Encephalopathy, unspecified: Secondary | ICD-10-CM | POA: Diagnosis not present

## 2017-11-17 DIAGNOSIS — R1310 Dysphagia, unspecified: Secondary | ICD-10-CM | POA: Diagnosis not present

## 2017-11-17 DIAGNOSIS — A419 Sepsis, unspecified organism: Secondary | ICD-10-CM | POA: Diagnosis not present

## 2017-11-17 DIAGNOSIS — J690 Pneumonitis due to inhalation of food and vomit: Secondary | ICD-10-CM | POA: Diagnosis not present

## 2017-11-17 DIAGNOSIS — G9340 Encephalopathy, unspecified: Secondary | ICD-10-CM | POA: Diagnosis not present

## 2017-11-17 DIAGNOSIS — G309 Alzheimer's disease, unspecified: Secondary | ICD-10-CM | POA: Diagnosis not present

## 2017-11-17 DIAGNOSIS — K529 Noninfective gastroenteritis and colitis, unspecified: Secondary | ICD-10-CM | POA: Diagnosis not present

## 2017-11-19 DIAGNOSIS — A419 Sepsis, unspecified organism: Secondary | ICD-10-CM | POA: Diagnosis not present

## 2017-11-19 DIAGNOSIS — K529 Noninfective gastroenteritis and colitis, unspecified: Secondary | ICD-10-CM | POA: Diagnosis not present

## 2017-11-19 DIAGNOSIS — G9340 Encephalopathy, unspecified: Secondary | ICD-10-CM | POA: Diagnosis not present

## 2017-11-19 DIAGNOSIS — G309 Alzheimer's disease, unspecified: Secondary | ICD-10-CM | POA: Diagnosis not present

## 2017-11-19 DIAGNOSIS — R1310 Dysphagia, unspecified: Secondary | ICD-10-CM | POA: Diagnosis not present

## 2017-11-19 DIAGNOSIS — J690 Pneumonitis due to inhalation of food and vomit: Secondary | ICD-10-CM | POA: Diagnosis not present

## 2017-11-22 DIAGNOSIS — G309 Alzheimer's disease, unspecified: Secondary | ICD-10-CM | POA: Diagnosis not present

## 2017-11-22 DIAGNOSIS — J690 Pneumonitis due to inhalation of food and vomit: Secondary | ICD-10-CM | POA: Diagnosis not present

## 2017-11-22 DIAGNOSIS — G9340 Encephalopathy, unspecified: Secondary | ICD-10-CM | POA: Diagnosis not present

## 2017-11-22 DIAGNOSIS — K529 Noninfective gastroenteritis and colitis, unspecified: Secondary | ICD-10-CM | POA: Diagnosis not present

## 2017-11-22 DIAGNOSIS — A419 Sepsis, unspecified organism: Secondary | ICD-10-CM | POA: Diagnosis not present

## 2017-11-22 DIAGNOSIS — R1310 Dysphagia, unspecified: Secondary | ICD-10-CM | POA: Diagnosis not present

## 2017-11-24 DIAGNOSIS — G309 Alzheimer's disease, unspecified: Secondary | ICD-10-CM | POA: Diagnosis not present

## 2017-11-24 DIAGNOSIS — A419 Sepsis, unspecified organism: Secondary | ICD-10-CM | POA: Diagnosis not present

## 2017-11-24 DIAGNOSIS — K529 Noninfective gastroenteritis and colitis, unspecified: Secondary | ICD-10-CM | POA: Diagnosis not present

## 2017-11-24 DIAGNOSIS — R1310 Dysphagia, unspecified: Secondary | ICD-10-CM | POA: Diagnosis not present

## 2017-11-24 DIAGNOSIS — J690 Pneumonitis due to inhalation of food and vomit: Secondary | ICD-10-CM | POA: Diagnosis not present

## 2017-11-24 DIAGNOSIS — G9340 Encephalopathy, unspecified: Secondary | ICD-10-CM | POA: Diagnosis not present

## 2017-11-26 DIAGNOSIS — A419 Sepsis, unspecified organism: Secondary | ICD-10-CM | POA: Diagnosis not present

## 2017-11-26 DIAGNOSIS — G9340 Encephalopathy, unspecified: Secondary | ICD-10-CM | POA: Diagnosis not present

## 2017-11-26 DIAGNOSIS — E46 Unspecified protein-calorie malnutrition: Secondary | ICD-10-CM | POA: Diagnosis not present

## 2017-11-26 DIAGNOSIS — F0281 Dementia in other diseases classified elsewhere with behavioral disturbance: Secondary | ICD-10-CM | POA: Diagnosis not present

## 2017-11-26 DIAGNOSIS — K529 Noninfective gastroenteritis and colitis, unspecified: Secondary | ICD-10-CM | POA: Diagnosis not present

## 2017-11-26 DIAGNOSIS — G309 Alzheimer's disease, unspecified: Secondary | ICD-10-CM | POA: Diagnosis not present

## 2017-11-26 DIAGNOSIS — E119 Type 2 diabetes mellitus without complications: Secondary | ICD-10-CM | POA: Diagnosis not present

## 2017-11-26 DIAGNOSIS — I10 Essential (primary) hypertension: Secondary | ICD-10-CM | POA: Diagnosis not present

## 2017-11-26 DIAGNOSIS — J690 Pneumonitis due to inhalation of food and vomit: Secondary | ICD-10-CM | POA: Diagnosis not present

## 2017-11-26 DIAGNOSIS — R1310 Dysphagia, unspecified: Secondary | ICD-10-CM | POA: Diagnosis not present

## 2017-11-29 DIAGNOSIS — A419 Sepsis, unspecified organism: Secondary | ICD-10-CM | POA: Diagnosis not present

## 2017-11-29 DIAGNOSIS — K529 Noninfective gastroenteritis and colitis, unspecified: Secondary | ICD-10-CM | POA: Diagnosis not present

## 2017-11-29 DIAGNOSIS — G9340 Encephalopathy, unspecified: Secondary | ICD-10-CM | POA: Diagnosis not present

## 2017-11-29 DIAGNOSIS — R1310 Dysphagia, unspecified: Secondary | ICD-10-CM | POA: Diagnosis not present

## 2017-11-29 DIAGNOSIS — G309 Alzheimer's disease, unspecified: Secondary | ICD-10-CM | POA: Diagnosis not present

## 2017-11-29 DIAGNOSIS — J690 Pneumonitis due to inhalation of food and vomit: Secondary | ICD-10-CM | POA: Diagnosis not present

## 2017-11-30 DIAGNOSIS — R1310 Dysphagia, unspecified: Secondary | ICD-10-CM | POA: Diagnosis not present

## 2017-11-30 DIAGNOSIS — A419 Sepsis, unspecified organism: Secondary | ICD-10-CM | POA: Diagnosis not present

## 2017-11-30 DIAGNOSIS — J690 Pneumonitis due to inhalation of food and vomit: Secondary | ICD-10-CM | POA: Diagnosis not present

## 2017-11-30 DIAGNOSIS — G9340 Encephalopathy, unspecified: Secondary | ICD-10-CM | POA: Diagnosis not present

## 2017-11-30 DIAGNOSIS — K529 Noninfective gastroenteritis and colitis, unspecified: Secondary | ICD-10-CM | POA: Diagnosis not present

## 2017-11-30 DIAGNOSIS — G309 Alzheimer's disease, unspecified: Secondary | ICD-10-CM | POA: Diagnosis not present

## 2017-12-01 DIAGNOSIS — G309 Alzheimer's disease, unspecified: Secondary | ICD-10-CM | POA: Diagnosis not present

## 2017-12-01 DIAGNOSIS — K529 Noninfective gastroenteritis and colitis, unspecified: Secondary | ICD-10-CM | POA: Diagnosis not present

## 2017-12-01 DIAGNOSIS — G9340 Encephalopathy, unspecified: Secondary | ICD-10-CM | POA: Diagnosis not present

## 2017-12-01 DIAGNOSIS — A419 Sepsis, unspecified organism: Secondary | ICD-10-CM | POA: Diagnosis not present

## 2017-12-01 DIAGNOSIS — R1310 Dysphagia, unspecified: Secondary | ICD-10-CM | POA: Diagnosis not present

## 2017-12-01 DIAGNOSIS — J690 Pneumonitis due to inhalation of food and vomit: Secondary | ICD-10-CM | POA: Diagnosis not present

## 2017-12-06 DIAGNOSIS — G9340 Encephalopathy, unspecified: Secondary | ICD-10-CM | POA: Diagnosis not present

## 2017-12-06 DIAGNOSIS — R1310 Dysphagia, unspecified: Secondary | ICD-10-CM | POA: Diagnosis not present

## 2017-12-06 DIAGNOSIS — K529 Noninfective gastroenteritis and colitis, unspecified: Secondary | ICD-10-CM | POA: Diagnosis not present

## 2017-12-06 DIAGNOSIS — J690 Pneumonitis due to inhalation of food and vomit: Secondary | ICD-10-CM | POA: Diagnosis not present

## 2017-12-06 DIAGNOSIS — A419 Sepsis, unspecified organism: Secondary | ICD-10-CM | POA: Diagnosis not present

## 2017-12-06 DIAGNOSIS — G309 Alzheimer's disease, unspecified: Secondary | ICD-10-CM | POA: Diagnosis not present

## 2017-12-08 DIAGNOSIS — G9340 Encephalopathy, unspecified: Secondary | ICD-10-CM | POA: Diagnosis not present

## 2017-12-08 DIAGNOSIS — R1310 Dysphagia, unspecified: Secondary | ICD-10-CM | POA: Diagnosis not present

## 2017-12-08 DIAGNOSIS — G309 Alzheimer's disease, unspecified: Secondary | ICD-10-CM | POA: Diagnosis not present

## 2017-12-08 DIAGNOSIS — A419 Sepsis, unspecified organism: Secondary | ICD-10-CM | POA: Diagnosis not present

## 2017-12-08 DIAGNOSIS — J690 Pneumonitis due to inhalation of food and vomit: Secondary | ICD-10-CM | POA: Diagnosis not present

## 2017-12-08 DIAGNOSIS — K529 Noninfective gastroenteritis and colitis, unspecified: Secondary | ICD-10-CM | POA: Diagnosis not present

## 2017-12-13 DIAGNOSIS — J690 Pneumonitis due to inhalation of food and vomit: Secondary | ICD-10-CM | POA: Diagnosis not present

## 2017-12-13 DIAGNOSIS — G309 Alzheimer's disease, unspecified: Secondary | ICD-10-CM | POA: Diagnosis not present

## 2017-12-13 DIAGNOSIS — R1310 Dysphagia, unspecified: Secondary | ICD-10-CM | POA: Diagnosis not present

## 2017-12-13 DIAGNOSIS — A419 Sepsis, unspecified organism: Secondary | ICD-10-CM | POA: Diagnosis not present

## 2017-12-13 DIAGNOSIS — K529 Noninfective gastroenteritis and colitis, unspecified: Secondary | ICD-10-CM | POA: Diagnosis not present

## 2017-12-13 DIAGNOSIS — G9340 Encephalopathy, unspecified: Secondary | ICD-10-CM | POA: Diagnosis not present

## 2017-12-15 DIAGNOSIS — K529 Noninfective gastroenteritis and colitis, unspecified: Secondary | ICD-10-CM | POA: Diagnosis not present

## 2017-12-15 DIAGNOSIS — A419 Sepsis, unspecified organism: Secondary | ICD-10-CM | POA: Diagnosis not present

## 2017-12-15 DIAGNOSIS — G309 Alzheimer's disease, unspecified: Secondary | ICD-10-CM | POA: Diagnosis not present

## 2017-12-15 DIAGNOSIS — J690 Pneumonitis due to inhalation of food and vomit: Secondary | ICD-10-CM | POA: Diagnosis not present

## 2017-12-15 DIAGNOSIS — G9340 Encephalopathy, unspecified: Secondary | ICD-10-CM | POA: Diagnosis not present

## 2017-12-15 DIAGNOSIS — R1310 Dysphagia, unspecified: Secondary | ICD-10-CM | POA: Diagnosis not present

## 2017-12-19 DIAGNOSIS — R1310 Dysphagia, unspecified: Secondary | ICD-10-CM | POA: Diagnosis not present

## 2017-12-19 DIAGNOSIS — G309 Alzheimer's disease, unspecified: Secondary | ICD-10-CM | POA: Diagnosis not present

## 2017-12-19 DIAGNOSIS — K529 Noninfective gastroenteritis and colitis, unspecified: Secondary | ICD-10-CM | POA: Diagnosis not present

## 2017-12-19 DIAGNOSIS — J690 Pneumonitis due to inhalation of food and vomit: Secondary | ICD-10-CM | POA: Diagnosis not present

## 2017-12-19 DIAGNOSIS — G9340 Encephalopathy, unspecified: Secondary | ICD-10-CM | POA: Diagnosis not present

## 2017-12-19 DIAGNOSIS — A419 Sepsis, unspecified organism: Secondary | ICD-10-CM | POA: Diagnosis not present

## 2017-12-20 DIAGNOSIS — G9340 Encephalopathy, unspecified: Secondary | ICD-10-CM | POA: Diagnosis not present

## 2017-12-20 DIAGNOSIS — K529 Noninfective gastroenteritis and colitis, unspecified: Secondary | ICD-10-CM | POA: Diagnosis not present

## 2017-12-20 DIAGNOSIS — J690 Pneumonitis due to inhalation of food and vomit: Secondary | ICD-10-CM | POA: Diagnosis not present

## 2017-12-20 DIAGNOSIS — G309 Alzheimer's disease, unspecified: Secondary | ICD-10-CM | POA: Diagnosis not present

## 2017-12-20 DIAGNOSIS — R1310 Dysphagia, unspecified: Secondary | ICD-10-CM | POA: Diagnosis not present

## 2017-12-20 DIAGNOSIS — A419 Sepsis, unspecified organism: Secondary | ICD-10-CM | POA: Diagnosis not present

## 2017-12-22 DIAGNOSIS — K529 Noninfective gastroenteritis and colitis, unspecified: Secondary | ICD-10-CM | POA: Diagnosis not present

## 2017-12-22 DIAGNOSIS — G309 Alzheimer's disease, unspecified: Secondary | ICD-10-CM | POA: Diagnosis not present

## 2017-12-22 DIAGNOSIS — R1310 Dysphagia, unspecified: Secondary | ICD-10-CM | POA: Diagnosis not present

## 2017-12-22 DIAGNOSIS — A419 Sepsis, unspecified organism: Secondary | ICD-10-CM | POA: Diagnosis not present

## 2017-12-22 DIAGNOSIS — G9340 Encephalopathy, unspecified: Secondary | ICD-10-CM | POA: Diagnosis not present

## 2017-12-22 DIAGNOSIS — J690 Pneumonitis due to inhalation of food and vomit: Secondary | ICD-10-CM | POA: Diagnosis not present

## 2017-12-27 DIAGNOSIS — R1310 Dysphagia, unspecified: Secondary | ICD-10-CM | POA: Diagnosis not present

## 2017-12-27 DIAGNOSIS — G309 Alzheimer's disease, unspecified: Secondary | ICD-10-CM | POA: Diagnosis not present

## 2017-12-27 DIAGNOSIS — G9340 Encephalopathy, unspecified: Secondary | ICD-10-CM | POA: Diagnosis not present

## 2017-12-27 DIAGNOSIS — A419 Sepsis, unspecified organism: Secondary | ICD-10-CM | POA: Diagnosis not present

## 2017-12-27 DIAGNOSIS — I10 Essential (primary) hypertension: Secondary | ICD-10-CM | POA: Diagnosis not present

## 2017-12-27 DIAGNOSIS — E119 Type 2 diabetes mellitus without complications: Secondary | ICD-10-CM | POA: Diagnosis not present

## 2017-12-27 DIAGNOSIS — J690 Pneumonitis due to inhalation of food and vomit: Secondary | ICD-10-CM | POA: Diagnosis not present

## 2017-12-27 DIAGNOSIS — F0281 Dementia in other diseases classified elsewhere with behavioral disturbance: Secondary | ICD-10-CM | POA: Diagnosis not present

## 2017-12-27 DIAGNOSIS — K529 Noninfective gastroenteritis and colitis, unspecified: Secondary | ICD-10-CM | POA: Diagnosis not present

## 2017-12-27 DIAGNOSIS — E46 Unspecified protein-calorie malnutrition: Secondary | ICD-10-CM | POA: Diagnosis not present

## 2017-12-28 DIAGNOSIS — G9340 Encephalopathy, unspecified: Secondary | ICD-10-CM | POA: Diagnosis not present

## 2017-12-28 DIAGNOSIS — A419 Sepsis, unspecified organism: Secondary | ICD-10-CM | POA: Diagnosis not present

## 2017-12-28 DIAGNOSIS — K529 Noninfective gastroenteritis and colitis, unspecified: Secondary | ICD-10-CM | POA: Diagnosis not present

## 2017-12-28 DIAGNOSIS — J690 Pneumonitis due to inhalation of food and vomit: Secondary | ICD-10-CM | POA: Diagnosis not present

## 2017-12-28 DIAGNOSIS — R1310 Dysphagia, unspecified: Secondary | ICD-10-CM | POA: Diagnosis not present

## 2017-12-28 DIAGNOSIS — G309 Alzheimer's disease, unspecified: Secondary | ICD-10-CM | POA: Diagnosis not present

## 2017-12-29 DIAGNOSIS — A419 Sepsis, unspecified organism: Secondary | ICD-10-CM | POA: Diagnosis not present

## 2017-12-29 DIAGNOSIS — R1310 Dysphagia, unspecified: Secondary | ICD-10-CM | POA: Diagnosis not present

## 2017-12-29 DIAGNOSIS — G309 Alzheimer's disease, unspecified: Secondary | ICD-10-CM | POA: Diagnosis not present

## 2017-12-29 DIAGNOSIS — G9340 Encephalopathy, unspecified: Secondary | ICD-10-CM | POA: Diagnosis not present

## 2017-12-29 DIAGNOSIS — K529 Noninfective gastroenteritis and colitis, unspecified: Secondary | ICD-10-CM | POA: Diagnosis not present

## 2017-12-29 DIAGNOSIS — J690 Pneumonitis due to inhalation of food and vomit: Secondary | ICD-10-CM | POA: Diagnosis not present

## 2018-01-03 DIAGNOSIS — K529 Noninfective gastroenteritis and colitis, unspecified: Secondary | ICD-10-CM | POA: Diagnosis not present

## 2018-01-03 DIAGNOSIS — G309 Alzheimer's disease, unspecified: Secondary | ICD-10-CM | POA: Diagnosis not present

## 2018-01-03 DIAGNOSIS — J690 Pneumonitis due to inhalation of food and vomit: Secondary | ICD-10-CM | POA: Diagnosis not present

## 2018-01-03 DIAGNOSIS — G9340 Encephalopathy, unspecified: Secondary | ICD-10-CM | POA: Diagnosis not present

## 2018-01-03 DIAGNOSIS — A419 Sepsis, unspecified organism: Secondary | ICD-10-CM | POA: Diagnosis not present

## 2018-01-03 DIAGNOSIS — R1310 Dysphagia, unspecified: Secondary | ICD-10-CM | POA: Diagnosis not present

## 2018-01-05 DIAGNOSIS — G9340 Encephalopathy, unspecified: Secondary | ICD-10-CM | POA: Diagnosis not present

## 2018-01-05 DIAGNOSIS — A419 Sepsis, unspecified organism: Secondary | ICD-10-CM | POA: Diagnosis not present

## 2018-01-05 DIAGNOSIS — R1310 Dysphagia, unspecified: Secondary | ICD-10-CM | POA: Diagnosis not present

## 2018-01-05 DIAGNOSIS — J690 Pneumonitis due to inhalation of food and vomit: Secondary | ICD-10-CM | POA: Diagnosis not present

## 2018-01-05 DIAGNOSIS — K529 Noninfective gastroenteritis and colitis, unspecified: Secondary | ICD-10-CM | POA: Diagnosis not present

## 2018-01-05 DIAGNOSIS — G309 Alzheimer's disease, unspecified: Secondary | ICD-10-CM | POA: Diagnosis not present

## 2018-01-07 DIAGNOSIS — G9340 Encephalopathy, unspecified: Secondary | ICD-10-CM | POA: Diagnosis not present

## 2018-01-07 DIAGNOSIS — K529 Noninfective gastroenteritis and colitis, unspecified: Secondary | ICD-10-CM | POA: Diagnosis not present

## 2018-01-07 DIAGNOSIS — R1310 Dysphagia, unspecified: Secondary | ICD-10-CM | POA: Diagnosis not present

## 2018-01-07 DIAGNOSIS — J690 Pneumonitis due to inhalation of food and vomit: Secondary | ICD-10-CM | POA: Diagnosis not present

## 2018-01-07 DIAGNOSIS — G309 Alzheimer's disease, unspecified: Secondary | ICD-10-CM | POA: Diagnosis not present

## 2018-01-07 DIAGNOSIS — A419 Sepsis, unspecified organism: Secondary | ICD-10-CM | POA: Diagnosis not present

## 2018-01-16 DIAGNOSIS — J690 Pneumonitis due to inhalation of food and vomit: Secondary | ICD-10-CM | POA: Diagnosis not present

## 2018-01-16 DIAGNOSIS — G9340 Encephalopathy, unspecified: Secondary | ICD-10-CM | POA: Diagnosis not present

## 2018-01-16 DIAGNOSIS — G309 Alzheimer's disease, unspecified: Secondary | ICD-10-CM | POA: Diagnosis not present

## 2018-01-16 DIAGNOSIS — K529 Noninfective gastroenteritis and colitis, unspecified: Secondary | ICD-10-CM | POA: Diagnosis not present

## 2018-01-16 DIAGNOSIS — R1310 Dysphagia, unspecified: Secondary | ICD-10-CM | POA: Diagnosis not present

## 2018-01-16 DIAGNOSIS — A419 Sepsis, unspecified organism: Secondary | ICD-10-CM | POA: Diagnosis not present

## 2018-01-17 DIAGNOSIS — R1310 Dysphagia, unspecified: Secondary | ICD-10-CM | POA: Diagnosis not present

## 2018-01-17 DIAGNOSIS — J690 Pneumonitis due to inhalation of food and vomit: Secondary | ICD-10-CM | POA: Diagnosis not present

## 2018-01-17 DIAGNOSIS — G309 Alzheimer's disease, unspecified: Secondary | ICD-10-CM | POA: Diagnosis not present

## 2018-01-17 DIAGNOSIS — A419 Sepsis, unspecified organism: Secondary | ICD-10-CM | POA: Diagnosis not present

## 2018-01-17 DIAGNOSIS — G9340 Encephalopathy, unspecified: Secondary | ICD-10-CM | POA: Diagnosis not present

## 2018-01-17 DIAGNOSIS — K529 Noninfective gastroenteritis and colitis, unspecified: Secondary | ICD-10-CM | POA: Diagnosis not present

## 2018-01-18 DIAGNOSIS — K529 Noninfective gastroenteritis and colitis, unspecified: Secondary | ICD-10-CM | POA: Diagnosis not present

## 2018-01-18 DIAGNOSIS — R1310 Dysphagia, unspecified: Secondary | ICD-10-CM | POA: Diagnosis not present

## 2018-01-18 DIAGNOSIS — J690 Pneumonitis due to inhalation of food and vomit: Secondary | ICD-10-CM | POA: Diagnosis not present

## 2018-01-18 DIAGNOSIS — A419 Sepsis, unspecified organism: Secondary | ICD-10-CM | POA: Diagnosis not present

## 2018-01-18 DIAGNOSIS — G9340 Encephalopathy, unspecified: Secondary | ICD-10-CM | POA: Diagnosis not present

## 2018-01-18 DIAGNOSIS — G309 Alzheimer's disease, unspecified: Secondary | ICD-10-CM | POA: Diagnosis not present

## 2018-01-19 DIAGNOSIS — J690 Pneumonitis due to inhalation of food and vomit: Secondary | ICD-10-CM | POA: Diagnosis not present

## 2018-01-19 DIAGNOSIS — R1310 Dysphagia, unspecified: Secondary | ICD-10-CM | POA: Diagnosis not present

## 2018-01-19 DIAGNOSIS — K529 Noninfective gastroenteritis and colitis, unspecified: Secondary | ICD-10-CM | POA: Diagnosis not present

## 2018-01-19 DIAGNOSIS — A419 Sepsis, unspecified organism: Secondary | ICD-10-CM | POA: Diagnosis not present

## 2018-01-19 DIAGNOSIS — G309 Alzheimer's disease, unspecified: Secondary | ICD-10-CM | POA: Diagnosis not present

## 2018-01-19 DIAGNOSIS — G9340 Encephalopathy, unspecified: Secondary | ICD-10-CM | POA: Diagnosis not present

## 2018-01-24 DIAGNOSIS — K529 Noninfective gastroenteritis and colitis, unspecified: Secondary | ICD-10-CM | POA: Diagnosis not present

## 2018-01-24 DIAGNOSIS — A419 Sepsis, unspecified organism: Secondary | ICD-10-CM | POA: Diagnosis not present

## 2018-01-24 DIAGNOSIS — J690 Pneumonitis due to inhalation of food and vomit: Secondary | ICD-10-CM | POA: Diagnosis not present

## 2018-01-24 DIAGNOSIS — G309 Alzheimer's disease, unspecified: Secondary | ICD-10-CM | POA: Diagnosis not present

## 2018-01-24 DIAGNOSIS — G9340 Encephalopathy, unspecified: Secondary | ICD-10-CM | POA: Diagnosis not present

## 2018-01-24 DIAGNOSIS — R1310 Dysphagia, unspecified: Secondary | ICD-10-CM | POA: Diagnosis not present

## 2018-01-26 DIAGNOSIS — R1310 Dysphagia, unspecified: Secondary | ICD-10-CM | POA: Diagnosis not present

## 2018-01-26 DIAGNOSIS — G309 Alzheimer's disease, unspecified: Secondary | ICD-10-CM | POA: Diagnosis not present

## 2018-01-26 DIAGNOSIS — F0281 Dementia in other diseases classified elsewhere with behavioral disturbance: Secondary | ICD-10-CM | POA: Diagnosis not present

## 2018-01-26 DIAGNOSIS — J690 Pneumonitis due to inhalation of food and vomit: Secondary | ICD-10-CM | POA: Diagnosis not present

## 2018-01-26 DIAGNOSIS — E119 Type 2 diabetes mellitus without complications: Secondary | ICD-10-CM | POA: Diagnosis not present

## 2018-01-26 DIAGNOSIS — I10 Essential (primary) hypertension: Secondary | ICD-10-CM | POA: Diagnosis not present

## 2018-01-26 DIAGNOSIS — G9340 Encephalopathy, unspecified: Secondary | ICD-10-CM | POA: Diagnosis not present

## 2018-01-26 DIAGNOSIS — A419 Sepsis, unspecified organism: Secondary | ICD-10-CM | POA: Diagnosis not present

## 2018-01-26 DIAGNOSIS — E46 Unspecified protein-calorie malnutrition: Secondary | ICD-10-CM | POA: Diagnosis not present

## 2018-01-26 DIAGNOSIS — K529 Noninfective gastroenteritis and colitis, unspecified: Secondary | ICD-10-CM | POA: Diagnosis not present

## 2018-01-26 IMAGING — CR DG CHEST 2V
2 series · 2 of 2 positions shown · non-contrast
Comparison: 06/19/2016

CLINICAL DATA: Shortness of breath on exertion, diarrhea, history
of aspiration pneumonia

EXAM:
CHEST  2 VIEW

[w chest lat]
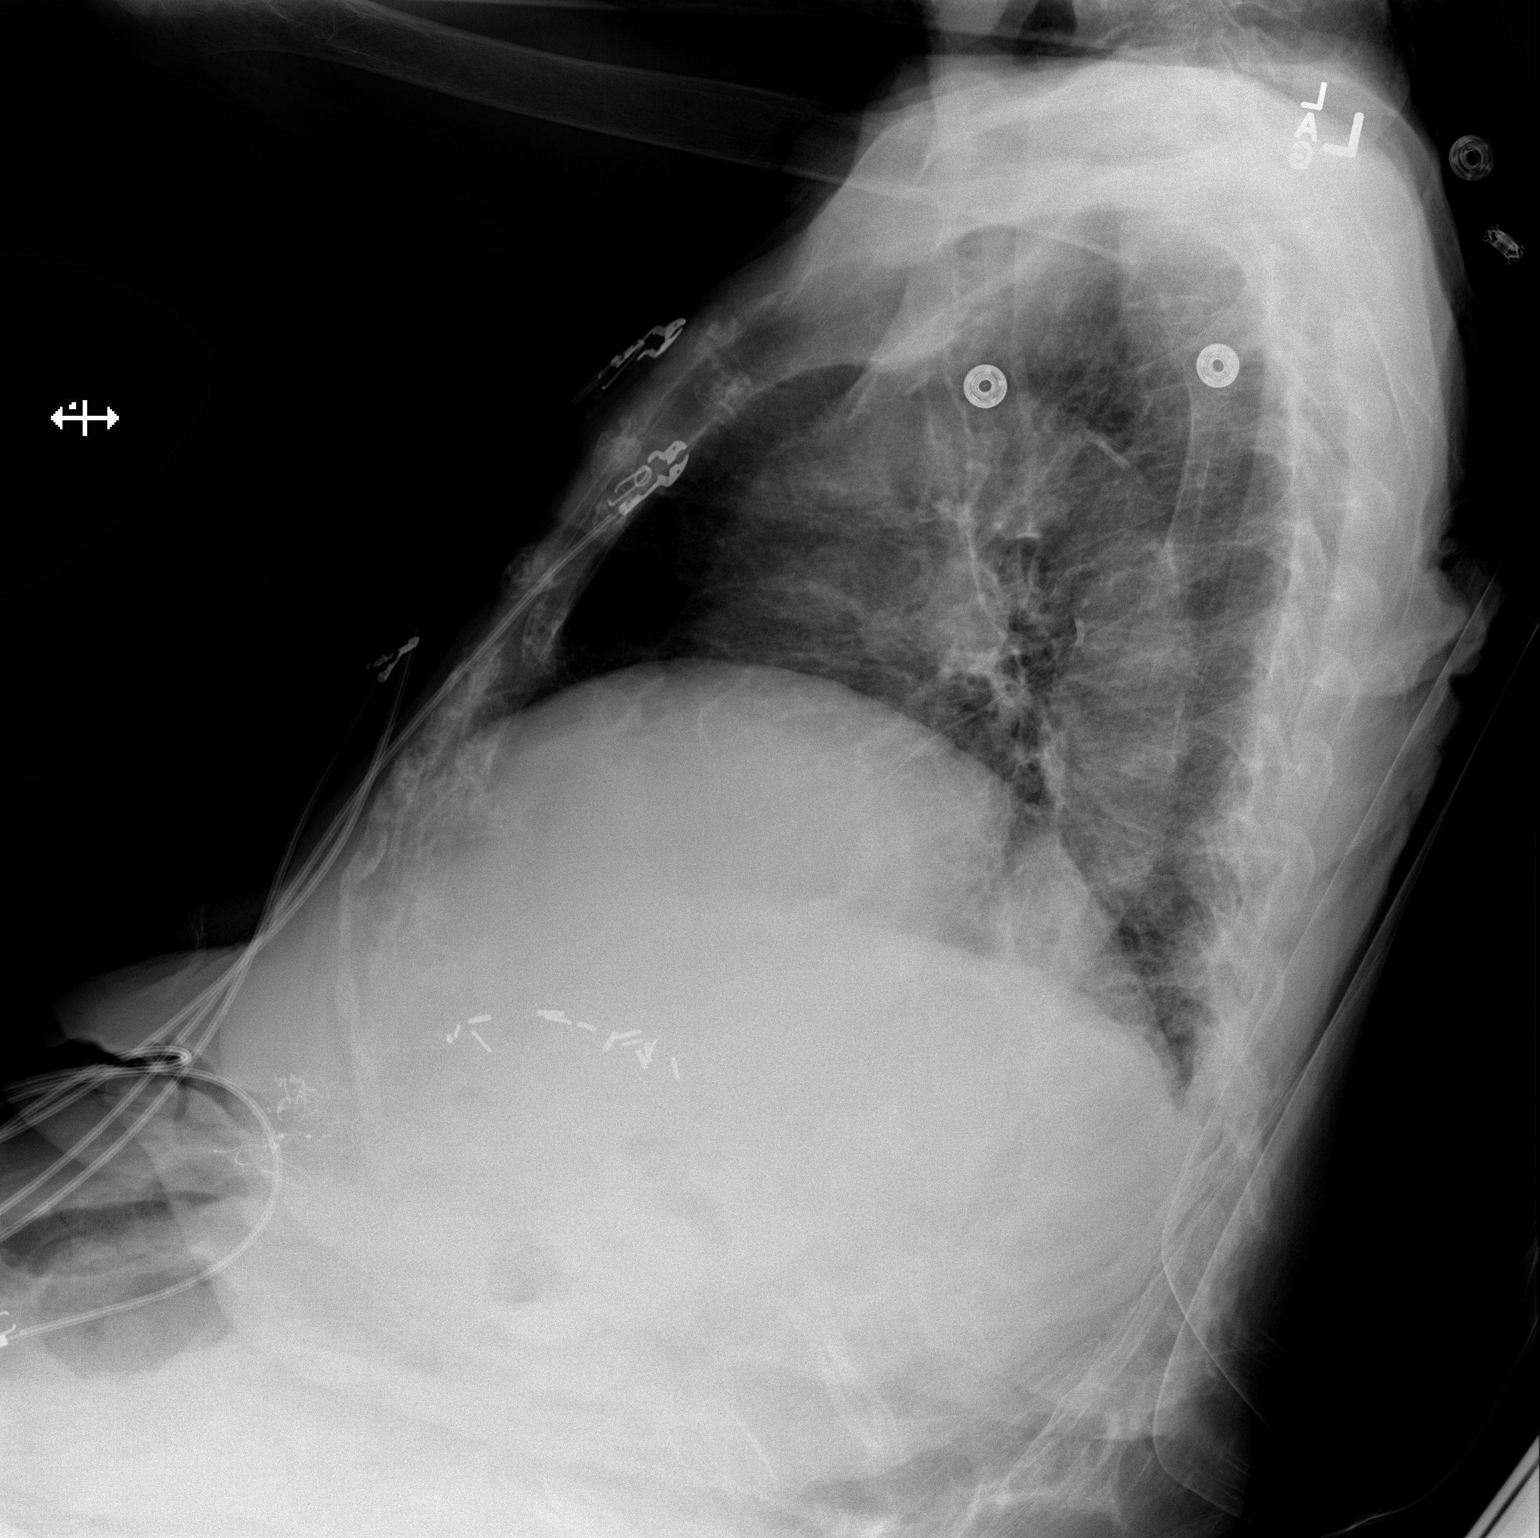

[x chest ap]
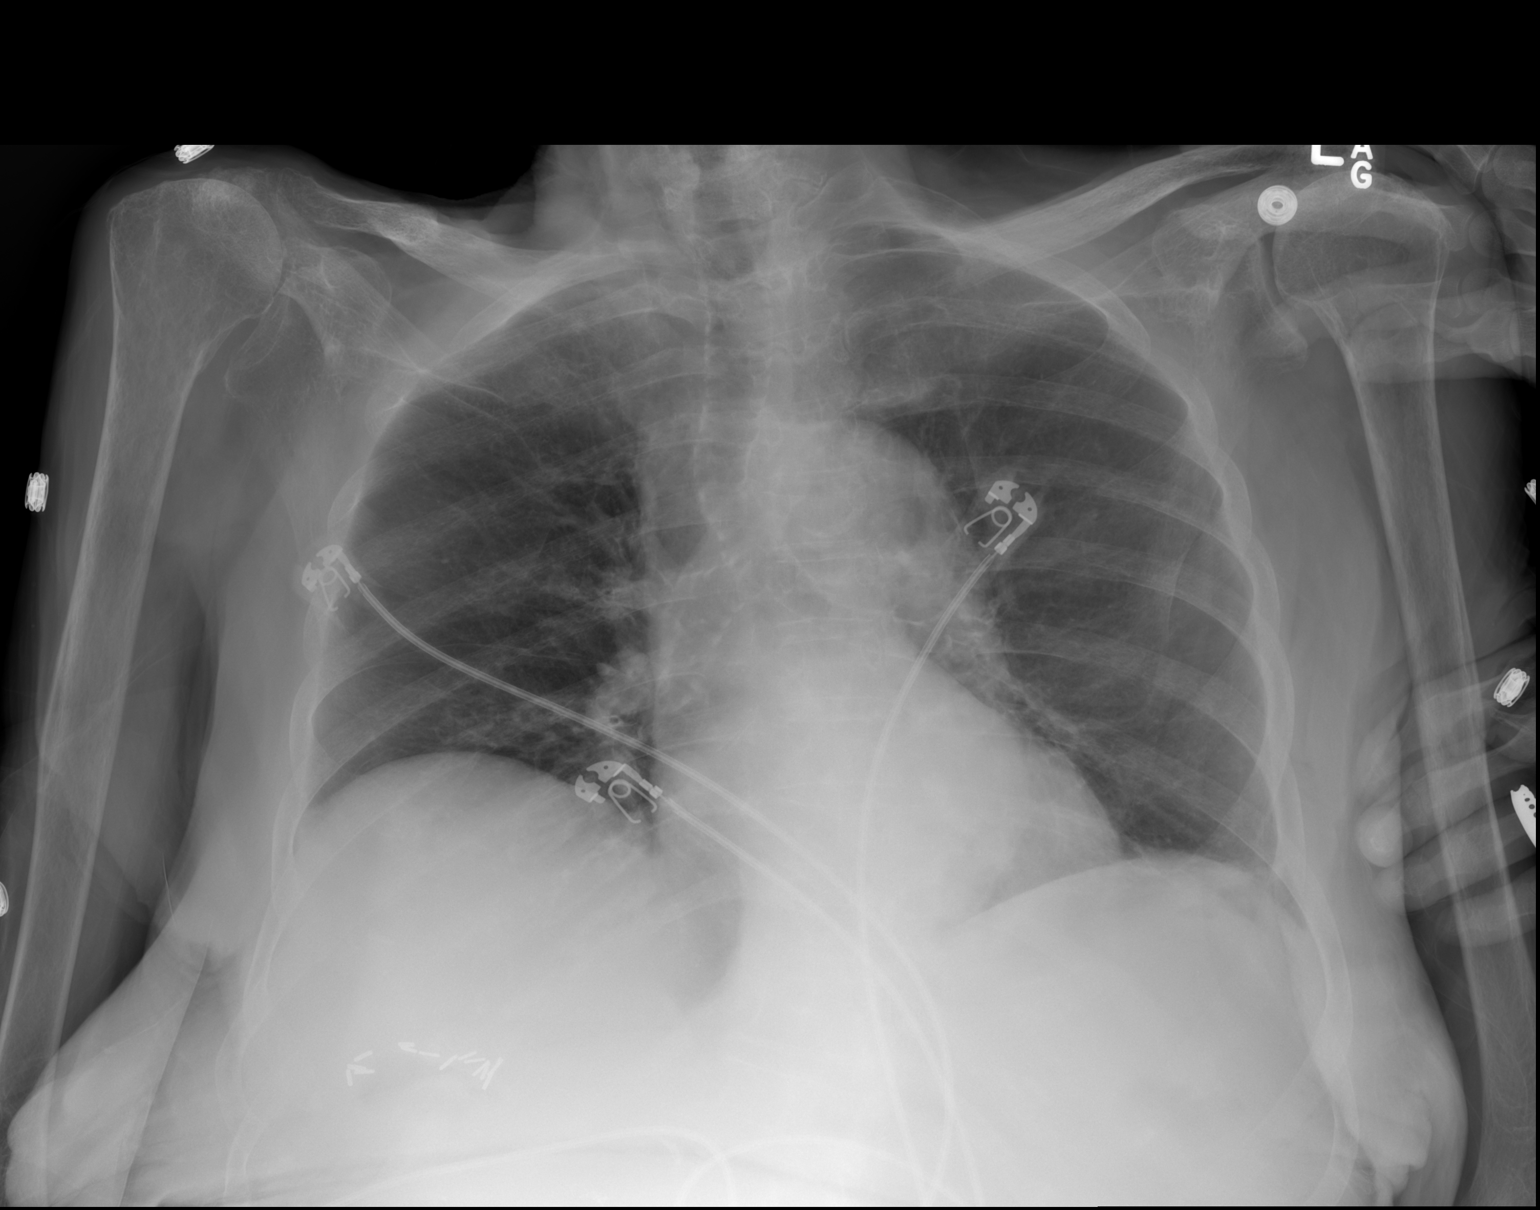

[2 of 2 positions shown; findings below may reference images not displayed]

FINDINGS: Shallow inspiration with linear atelectasis in the lung bases. No
airspace disease or consolidation. No blunting of costophrenic
angles. No pneumothorax. Heart size and pulmonary vascularity are
normal. Calcified and tortuous aorta. Surgical clips in the right
upper quadrant. Degenerative changes in the spine and shoulders.
Multiple vertebral compression deformities are poorly visualized due
to bone demineralization but appear unchanged. Changes likely
represent osteoporosis.
IMPRESSION: Shallow inspiration with linear atelectasis in the lung bases. No
evidence of active pulmonary disease. Aortic atherosclerosis. Bone
demineralization with multiple vertebral compression deformities,
likely osteoporosis.

## 2018-01-31 DIAGNOSIS — J690 Pneumonitis due to inhalation of food and vomit: Secondary | ICD-10-CM | POA: Diagnosis not present

## 2018-01-31 DIAGNOSIS — G9340 Encephalopathy, unspecified: Secondary | ICD-10-CM | POA: Diagnosis not present

## 2018-01-31 DIAGNOSIS — G309 Alzheimer's disease, unspecified: Secondary | ICD-10-CM | POA: Diagnosis not present

## 2018-01-31 DIAGNOSIS — K529 Noninfective gastroenteritis and colitis, unspecified: Secondary | ICD-10-CM | POA: Diagnosis not present

## 2018-01-31 DIAGNOSIS — R1310 Dysphagia, unspecified: Secondary | ICD-10-CM | POA: Diagnosis not present

## 2018-01-31 DIAGNOSIS — A419 Sepsis, unspecified organism: Secondary | ICD-10-CM | POA: Diagnosis not present

## 2018-02-03 DIAGNOSIS — G9340 Encephalopathy, unspecified: Secondary | ICD-10-CM | POA: Diagnosis not present

## 2018-02-03 DIAGNOSIS — K529 Noninfective gastroenteritis and colitis, unspecified: Secondary | ICD-10-CM | POA: Diagnosis not present

## 2018-02-03 DIAGNOSIS — R1310 Dysphagia, unspecified: Secondary | ICD-10-CM | POA: Diagnosis not present

## 2018-02-03 DIAGNOSIS — J690 Pneumonitis due to inhalation of food and vomit: Secondary | ICD-10-CM | POA: Diagnosis not present

## 2018-02-03 DIAGNOSIS — A419 Sepsis, unspecified organism: Secondary | ICD-10-CM | POA: Diagnosis not present

## 2018-02-03 DIAGNOSIS — G309 Alzheimer's disease, unspecified: Secondary | ICD-10-CM | POA: Diagnosis not present

## 2018-02-07 DIAGNOSIS — G309 Alzheimer's disease, unspecified: Secondary | ICD-10-CM | POA: Diagnosis not present

## 2018-02-07 DIAGNOSIS — R1310 Dysphagia, unspecified: Secondary | ICD-10-CM | POA: Diagnosis not present

## 2018-02-07 DIAGNOSIS — J690 Pneumonitis due to inhalation of food and vomit: Secondary | ICD-10-CM | POA: Diagnosis not present

## 2018-02-07 DIAGNOSIS — K529 Noninfective gastroenteritis and colitis, unspecified: Secondary | ICD-10-CM | POA: Diagnosis not present

## 2018-02-07 DIAGNOSIS — G9340 Encephalopathy, unspecified: Secondary | ICD-10-CM | POA: Diagnosis not present

## 2018-02-07 DIAGNOSIS — A419 Sepsis, unspecified organism: Secondary | ICD-10-CM | POA: Diagnosis not present

## 2018-02-09 DIAGNOSIS — R1310 Dysphagia, unspecified: Secondary | ICD-10-CM | POA: Diagnosis not present

## 2018-02-09 DIAGNOSIS — G309 Alzheimer's disease, unspecified: Secondary | ICD-10-CM | POA: Diagnosis not present

## 2018-02-09 DIAGNOSIS — J690 Pneumonitis due to inhalation of food and vomit: Secondary | ICD-10-CM | POA: Diagnosis not present

## 2018-02-09 DIAGNOSIS — K529 Noninfective gastroenteritis and colitis, unspecified: Secondary | ICD-10-CM | POA: Diagnosis not present

## 2018-02-09 DIAGNOSIS — G9340 Encephalopathy, unspecified: Secondary | ICD-10-CM | POA: Diagnosis not present

## 2018-02-09 DIAGNOSIS — A419 Sepsis, unspecified organism: Secondary | ICD-10-CM | POA: Diagnosis not present

## 2018-02-10 DIAGNOSIS — R1310 Dysphagia, unspecified: Secondary | ICD-10-CM | POA: Diagnosis not present

## 2018-02-10 DIAGNOSIS — J690 Pneumonitis due to inhalation of food and vomit: Secondary | ICD-10-CM | POA: Diagnosis not present

## 2018-02-10 DIAGNOSIS — K529 Noninfective gastroenteritis and colitis, unspecified: Secondary | ICD-10-CM | POA: Diagnosis not present

## 2018-02-10 DIAGNOSIS — G9340 Encephalopathy, unspecified: Secondary | ICD-10-CM | POA: Diagnosis not present

## 2018-02-10 DIAGNOSIS — A419 Sepsis, unspecified organism: Secondary | ICD-10-CM | POA: Diagnosis not present

## 2018-02-10 DIAGNOSIS — G309 Alzheimer's disease, unspecified: Secondary | ICD-10-CM | POA: Diagnosis not present

## 2018-02-14 DIAGNOSIS — J690 Pneumonitis due to inhalation of food and vomit: Secondary | ICD-10-CM | POA: Diagnosis not present

## 2018-02-14 DIAGNOSIS — A419 Sepsis, unspecified organism: Secondary | ICD-10-CM | POA: Diagnosis not present

## 2018-02-14 DIAGNOSIS — R1310 Dysphagia, unspecified: Secondary | ICD-10-CM | POA: Diagnosis not present

## 2018-02-14 DIAGNOSIS — G309 Alzheimer's disease, unspecified: Secondary | ICD-10-CM | POA: Diagnosis not present

## 2018-02-14 DIAGNOSIS — K529 Noninfective gastroenteritis and colitis, unspecified: Secondary | ICD-10-CM | POA: Diagnosis not present

## 2018-02-14 DIAGNOSIS — G9340 Encephalopathy, unspecified: Secondary | ICD-10-CM | POA: Diagnosis not present

## 2018-02-16 DIAGNOSIS — K529 Noninfective gastroenteritis and colitis, unspecified: Secondary | ICD-10-CM | POA: Diagnosis not present

## 2018-02-16 DIAGNOSIS — R1310 Dysphagia, unspecified: Secondary | ICD-10-CM | POA: Diagnosis not present

## 2018-02-16 DIAGNOSIS — G309 Alzheimer's disease, unspecified: Secondary | ICD-10-CM | POA: Diagnosis not present

## 2018-02-16 DIAGNOSIS — G9340 Encephalopathy, unspecified: Secondary | ICD-10-CM | POA: Diagnosis not present

## 2018-02-16 DIAGNOSIS — A419 Sepsis, unspecified organism: Secondary | ICD-10-CM | POA: Diagnosis not present

## 2018-02-16 DIAGNOSIS — J690 Pneumonitis due to inhalation of food and vomit: Secondary | ICD-10-CM | POA: Diagnosis not present

## 2018-02-23 DIAGNOSIS — G9340 Encephalopathy, unspecified: Secondary | ICD-10-CM | POA: Diagnosis not present

## 2018-02-23 DIAGNOSIS — J690 Pneumonitis due to inhalation of food and vomit: Secondary | ICD-10-CM | POA: Diagnosis not present

## 2018-02-23 DIAGNOSIS — R1310 Dysphagia, unspecified: Secondary | ICD-10-CM | POA: Diagnosis not present

## 2018-02-23 DIAGNOSIS — A419 Sepsis, unspecified organism: Secondary | ICD-10-CM | POA: Diagnosis not present

## 2018-02-23 DIAGNOSIS — G309 Alzheimer's disease, unspecified: Secondary | ICD-10-CM | POA: Diagnosis not present

## 2018-02-23 DIAGNOSIS — K529 Noninfective gastroenteritis and colitis, unspecified: Secondary | ICD-10-CM | POA: Diagnosis not present

## 2018-02-26 DIAGNOSIS — G9340 Encephalopathy, unspecified: Secondary | ICD-10-CM | POA: Diagnosis not present

## 2018-02-26 DIAGNOSIS — F0281 Dementia in other diseases classified elsewhere with behavioral disturbance: Secondary | ICD-10-CM | POA: Diagnosis not present

## 2018-02-26 DIAGNOSIS — J690 Pneumonitis due to inhalation of food and vomit: Secondary | ICD-10-CM | POA: Diagnosis not present

## 2018-02-26 DIAGNOSIS — R1310 Dysphagia, unspecified: Secondary | ICD-10-CM | POA: Diagnosis not present

## 2018-02-26 DIAGNOSIS — E119 Type 2 diabetes mellitus without complications: Secondary | ICD-10-CM | POA: Diagnosis not present

## 2018-02-26 DIAGNOSIS — K529 Noninfective gastroenteritis and colitis, unspecified: Secondary | ICD-10-CM | POA: Diagnosis not present

## 2018-02-26 DIAGNOSIS — E46 Unspecified protein-calorie malnutrition: Secondary | ICD-10-CM | POA: Diagnosis not present

## 2018-02-26 DIAGNOSIS — A419 Sepsis, unspecified organism: Secondary | ICD-10-CM | POA: Diagnosis not present

## 2018-02-26 DIAGNOSIS — I10 Essential (primary) hypertension: Secondary | ICD-10-CM | POA: Diagnosis not present

## 2018-02-26 DIAGNOSIS — G309 Alzheimer's disease, unspecified: Secondary | ICD-10-CM | POA: Diagnosis not present

## 2018-02-28 DIAGNOSIS — G9340 Encephalopathy, unspecified: Secondary | ICD-10-CM | POA: Diagnosis not present

## 2018-02-28 DIAGNOSIS — A419 Sepsis, unspecified organism: Secondary | ICD-10-CM | POA: Diagnosis not present

## 2018-02-28 DIAGNOSIS — J690 Pneumonitis due to inhalation of food and vomit: Secondary | ICD-10-CM | POA: Diagnosis not present

## 2018-02-28 DIAGNOSIS — G309 Alzheimer's disease, unspecified: Secondary | ICD-10-CM | POA: Diagnosis not present

## 2018-02-28 DIAGNOSIS — K529 Noninfective gastroenteritis and colitis, unspecified: Secondary | ICD-10-CM | POA: Diagnosis not present

## 2018-02-28 DIAGNOSIS — R1310 Dysphagia, unspecified: Secondary | ICD-10-CM | POA: Diagnosis not present

## 2018-03-01 DIAGNOSIS — A419 Sepsis, unspecified organism: Secondary | ICD-10-CM | POA: Diagnosis not present

## 2018-03-01 DIAGNOSIS — R1310 Dysphagia, unspecified: Secondary | ICD-10-CM | POA: Diagnosis not present

## 2018-03-01 DIAGNOSIS — G309 Alzheimer's disease, unspecified: Secondary | ICD-10-CM | POA: Diagnosis not present

## 2018-03-01 DIAGNOSIS — K529 Noninfective gastroenteritis and colitis, unspecified: Secondary | ICD-10-CM | POA: Diagnosis not present

## 2018-03-01 DIAGNOSIS — G9340 Encephalopathy, unspecified: Secondary | ICD-10-CM | POA: Diagnosis not present

## 2018-03-01 DIAGNOSIS — J690 Pneumonitis due to inhalation of food and vomit: Secondary | ICD-10-CM | POA: Diagnosis not present

## 2018-03-08 DIAGNOSIS — J690 Pneumonitis due to inhalation of food and vomit: Secondary | ICD-10-CM | POA: Diagnosis not present

## 2018-03-08 DIAGNOSIS — A419 Sepsis, unspecified organism: Secondary | ICD-10-CM | POA: Diagnosis not present

## 2018-03-08 DIAGNOSIS — G9340 Encephalopathy, unspecified: Secondary | ICD-10-CM | POA: Diagnosis not present

## 2018-03-08 DIAGNOSIS — K529 Noninfective gastroenteritis and colitis, unspecified: Secondary | ICD-10-CM | POA: Diagnosis not present

## 2018-03-08 DIAGNOSIS — G309 Alzheimer's disease, unspecified: Secondary | ICD-10-CM | POA: Diagnosis not present

## 2018-03-08 DIAGNOSIS — R1310 Dysphagia, unspecified: Secondary | ICD-10-CM | POA: Diagnosis not present

## 2018-03-14 DIAGNOSIS — R1310 Dysphagia, unspecified: Secondary | ICD-10-CM | POA: Diagnosis not present

## 2018-03-14 DIAGNOSIS — G9340 Encephalopathy, unspecified: Secondary | ICD-10-CM | POA: Diagnosis not present

## 2018-03-14 DIAGNOSIS — A419 Sepsis, unspecified organism: Secondary | ICD-10-CM | POA: Diagnosis not present

## 2018-03-14 DIAGNOSIS — J690 Pneumonitis due to inhalation of food and vomit: Secondary | ICD-10-CM | POA: Diagnosis not present

## 2018-03-14 DIAGNOSIS — K529 Noninfective gastroenteritis and colitis, unspecified: Secondary | ICD-10-CM | POA: Diagnosis not present

## 2018-03-14 DIAGNOSIS — G309 Alzheimer's disease, unspecified: Secondary | ICD-10-CM | POA: Diagnosis not present

## 2018-03-15 DIAGNOSIS — G309 Alzheimer's disease, unspecified: Secondary | ICD-10-CM | POA: Diagnosis not present

## 2018-03-15 DIAGNOSIS — R1310 Dysphagia, unspecified: Secondary | ICD-10-CM | POA: Diagnosis not present

## 2018-03-15 DIAGNOSIS — J690 Pneumonitis due to inhalation of food and vomit: Secondary | ICD-10-CM | POA: Diagnosis not present

## 2018-03-15 DIAGNOSIS — A419 Sepsis, unspecified organism: Secondary | ICD-10-CM | POA: Diagnosis not present

## 2018-03-15 DIAGNOSIS — G9340 Encephalopathy, unspecified: Secondary | ICD-10-CM | POA: Diagnosis not present

## 2018-03-15 DIAGNOSIS — K529 Noninfective gastroenteritis and colitis, unspecified: Secondary | ICD-10-CM | POA: Diagnosis not present

## 2018-03-22 DIAGNOSIS — R1310 Dysphagia, unspecified: Secondary | ICD-10-CM | POA: Diagnosis not present

## 2018-03-22 DIAGNOSIS — G9340 Encephalopathy, unspecified: Secondary | ICD-10-CM | POA: Diagnosis not present

## 2018-03-22 DIAGNOSIS — J690 Pneumonitis due to inhalation of food and vomit: Secondary | ICD-10-CM | POA: Diagnosis not present

## 2018-03-22 DIAGNOSIS — A419 Sepsis, unspecified organism: Secondary | ICD-10-CM | POA: Diagnosis not present

## 2018-03-22 DIAGNOSIS — K529 Noninfective gastroenteritis and colitis, unspecified: Secondary | ICD-10-CM | POA: Diagnosis not present

## 2018-03-22 DIAGNOSIS — G309 Alzheimer's disease, unspecified: Secondary | ICD-10-CM | POA: Diagnosis not present

## 2018-03-28 DIAGNOSIS — G309 Alzheimer's disease, unspecified: Secondary | ICD-10-CM | POA: Diagnosis not present

## 2018-03-28 DIAGNOSIS — A419 Sepsis, unspecified organism: Secondary | ICD-10-CM | POA: Diagnosis not present

## 2018-03-28 DIAGNOSIS — R1310 Dysphagia, unspecified: Secondary | ICD-10-CM | POA: Diagnosis not present

## 2018-03-28 DIAGNOSIS — E119 Type 2 diabetes mellitus without complications: Secondary | ICD-10-CM | POA: Diagnosis not present

## 2018-03-28 DIAGNOSIS — I10 Essential (primary) hypertension: Secondary | ICD-10-CM | POA: Diagnosis not present

## 2018-03-28 DIAGNOSIS — E46 Unspecified protein-calorie malnutrition: Secondary | ICD-10-CM | POA: Diagnosis not present

## 2018-03-28 DIAGNOSIS — K529 Noninfective gastroenteritis and colitis, unspecified: Secondary | ICD-10-CM | POA: Diagnosis not present

## 2018-03-28 DIAGNOSIS — G9340 Encephalopathy, unspecified: Secondary | ICD-10-CM | POA: Diagnosis not present

## 2018-03-28 DIAGNOSIS — J690 Pneumonitis due to inhalation of food and vomit: Secondary | ICD-10-CM | POA: Diagnosis not present

## 2018-03-28 DIAGNOSIS — F0281 Dementia in other diseases classified elsewhere with behavioral disturbance: Secondary | ICD-10-CM | POA: Diagnosis not present

## 2018-03-29 DIAGNOSIS — G9340 Encephalopathy, unspecified: Secondary | ICD-10-CM | POA: Diagnosis not present

## 2018-03-29 DIAGNOSIS — A419 Sepsis, unspecified organism: Secondary | ICD-10-CM | POA: Diagnosis not present

## 2018-03-29 DIAGNOSIS — G309 Alzheimer's disease, unspecified: Secondary | ICD-10-CM | POA: Diagnosis not present

## 2018-03-29 DIAGNOSIS — K529 Noninfective gastroenteritis and colitis, unspecified: Secondary | ICD-10-CM | POA: Diagnosis not present

## 2018-03-29 DIAGNOSIS — J690 Pneumonitis due to inhalation of food and vomit: Secondary | ICD-10-CM | POA: Diagnosis not present

## 2018-03-29 DIAGNOSIS — R1310 Dysphagia, unspecified: Secondary | ICD-10-CM | POA: Diagnosis not present

## 2018-04-04 DIAGNOSIS — A419 Sepsis, unspecified organism: Secondary | ICD-10-CM | POA: Diagnosis not present

## 2018-04-04 DIAGNOSIS — G9340 Encephalopathy, unspecified: Secondary | ICD-10-CM | POA: Diagnosis not present

## 2018-04-04 DIAGNOSIS — J690 Pneumonitis due to inhalation of food and vomit: Secondary | ICD-10-CM | POA: Diagnosis not present

## 2018-04-04 DIAGNOSIS — K529 Noninfective gastroenteritis and colitis, unspecified: Secondary | ICD-10-CM | POA: Diagnosis not present

## 2018-04-04 DIAGNOSIS — R1310 Dysphagia, unspecified: Secondary | ICD-10-CM | POA: Diagnosis not present

## 2018-04-04 DIAGNOSIS — G309 Alzheimer's disease, unspecified: Secondary | ICD-10-CM | POA: Diagnosis not present

## 2018-04-07 DIAGNOSIS — A419 Sepsis, unspecified organism: Secondary | ICD-10-CM | POA: Diagnosis not present

## 2018-04-07 DIAGNOSIS — R1310 Dysphagia, unspecified: Secondary | ICD-10-CM | POA: Diagnosis not present

## 2018-04-07 DIAGNOSIS — K529 Noninfective gastroenteritis and colitis, unspecified: Secondary | ICD-10-CM | POA: Diagnosis not present

## 2018-04-07 DIAGNOSIS — G9340 Encephalopathy, unspecified: Secondary | ICD-10-CM | POA: Diagnosis not present

## 2018-04-07 DIAGNOSIS — G309 Alzheimer's disease, unspecified: Secondary | ICD-10-CM | POA: Diagnosis not present

## 2018-04-07 DIAGNOSIS — J690 Pneumonitis due to inhalation of food and vomit: Secondary | ICD-10-CM | POA: Diagnosis not present

## 2018-04-11 DIAGNOSIS — K529 Noninfective gastroenteritis and colitis, unspecified: Secondary | ICD-10-CM | POA: Diagnosis not present

## 2018-04-11 DIAGNOSIS — J690 Pneumonitis due to inhalation of food and vomit: Secondary | ICD-10-CM | POA: Diagnosis not present

## 2018-04-11 DIAGNOSIS — G9340 Encephalopathy, unspecified: Secondary | ICD-10-CM | POA: Diagnosis not present

## 2018-04-11 DIAGNOSIS — R1310 Dysphagia, unspecified: Secondary | ICD-10-CM | POA: Diagnosis not present

## 2018-04-11 DIAGNOSIS — A419 Sepsis, unspecified organism: Secondary | ICD-10-CM | POA: Diagnosis not present

## 2018-04-11 DIAGNOSIS — G309 Alzheimer's disease, unspecified: Secondary | ICD-10-CM | POA: Diagnosis not present

## 2018-04-18 DIAGNOSIS — G309 Alzheimer's disease, unspecified: Secondary | ICD-10-CM | POA: Diagnosis not present

## 2018-04-18 DIAGNOSIS — G9340 Encephalopathy, unspecified: Secondary | ICD-10-CM | POA: Diagnosis not present

## 2018-04-18 DIAGNOSIS — R1310 Dysphagia, unspecified: Secondary | ICD-10-CM | POA: Diagnosis not present

## 2018-04-18 DIAGNOSIS — J690 Pneumonitis due to inhalation of food and vomit: Secondary | ICD-10-CM | POA: Diagnosis not present

## 2018-04-18 DIAGNOSIS — K529 Noninfective gastroenteritis and colitis, unspecified: Secondary | ICD-10-CM | POA: Diagnosis not present

## 2018-04-18 DIAGNOSIS — A419 Sepsis, unspecified organism: Secondary | ICD-10-CM | POA: Diagnosis not present

## 2018-04-25 DIAGNOSIS — K529 Noninfective gastroenteritis and colitis, unspecified: Secondary | ICD-10-CM | POA: Diagnosis not present

## 2018-04-25 DIAGNOSIS — G9340 Encephalopathy, unspecified: Secondary | ICD-10-CM | POA: Diagnosis not present

## 2018-04-25 DIAGNOSIS — J690 Pneumonitis due to inhalation of food and vomit: Secondary | ICD-10-CM | POA: Diagnosis not present

## 2018-04-25 DIAGNOSIS — R1310 Dysphagia, unspecified: Secondary | ICD-10-CM | POA: Diagnosis not present

## 2018-04-25 DIAGNOSIS — G309 Alzheimer's disease, unspecified: Secondary | ICD-10-CM | POA: Diagnosis not present

## 2018-04-25 DIAGNOSIS — A419 Sepsis, unspecified organism: Secondary | ICD-10-CM | POA: Diagnosis not present

## 2018-04-28 DIAGNOSIS — E46 Unspecified protein-calorie malnutrition: Secondary | ICD-10-CM | POA: Diagnosis not present

## 2018-04-28 DIAGNOSIS — F0281 Dementia in other diseases classified elsewhere with behavioral disturbance: Secondary | ICD-10-CM | POA: Diagnosis not present

## 2018-04-28 DIAGNOSIS — G309 Alzheimer's disease, unspecified: Secondary | ICD-10-CM | POA: Diagnosis not present

## 2018-04-28 DIAGNOSIS — G9340 Encephalopathy, unspecified: Secondary | ICD-10-CM | POA: Diagnosis not present

## 2018-04-28 DIAGNOSIS — R1310 Dysphagia, unspecified: Secondary | ICD-10-CM | POA: Diagnosis not present

## 2018-04-28 DIAGNOSIS — J690 Pneumonitis due to inhalation of food and vomit: Secondary | ICD-10-CM | POA: Diagnosis not present

## 2018-04-28 DIAGNOSIS — I10 Essential (primary) hypertension: Secondary | ICD-10-CM | POA: Diagnosis not present

## 2018-04-28 DIAGNOSIS — A419 Sepsis, unspecified organism: Secondary | ICD-10-CM | POA: Diagnosis not present

## 2018-04-28 DIAGNOSIS — K529 Noninfective gastroenteritis and colitis, unspecified: Secondary | ICD-10-CM | POA: Diagnosis not present

## 2018-04-28 DIAGNOSIS — E119 Type 2 diabetes mellitus without complications: Secondary | ICD-10-CM | POA: Diagnosis not present

## 2018-04-29 DIAGNOSIS — A419 Sepsis, unspecified organism: Secondary | ICD-10-CM | POA: Diagnosis not present

## 2018-04-29 DIAGNOSIS — K529 Noninfective gastroenteritis and colitis, unspecified: Secondary | ICD-10-CM | POA: Diagnosis not present

## 2018-04-29 DIAGNOSIS — R1310 Dysphagia, unspecified: Secondary | ICD-10-CM | POA: Diagnosis not present

## 2018-04-29 DIAGNOSIS — J690 Pneumonitis due to inhalation of food and vomit: Secondary | ICD-10-CM | POA: Diagnosis not present

## 2018-04-29 DIAGNOSIS — G9340 Encephalopathy, unspecified: Secondary | ICD-10-CM | POA: Diagnosis not present

## 2018-04-29 DIAGNOSIS — G309 Alzheimer's disease, unspecified: Secondary | ICD-10-CM | POA: Diagnosis not present

## 2018-05-02 DIAGNOSIS — K529 Noninfective gastroenteritis and colitis, unspecified: Secondary | ICD-10-CM | POA: Diagnosis not present

## 2018-05-02 DIAGNOSIS — G309 Alzheimer's disease, unspecified: Secondary | ICD-10-CM | POA: Diagnosis not present

## 2018-05-02 DIAGNOSIS — R1310 Dysphagia, unspecified: Secondary | ICD-10-CM | POA: Diagnosis not present

## 2018-05-02 DIAGNOSIS — A419 Sepsis, unspecified organism: Secondary | ICD-10-CM | POA: Diagnosis not present

## 2018-05-02 DIAGNOSIS — G9340 Encephalopathy, unspecified: Secondary | ICD-10-CM | POA: Diagnosis not present

## 2018-05-02 DIAGNOSIS — J690 Pneumonitis due to inhalation of food and vomit: Secondary | ICD-10-CM | POA: Diagnosis not present

## 2018-05-09 DIAGNOSIS — G309 Alzheimer's disease, unspecified: Secondary | ICD-10-CM | POA: Diagnosis not present

## 2018-05-09 DIAGNOSIS — A419 Sepsis, unspecified organism: Secondary | ICD-10-CM | POA: Diagnosis not present

## 2018-05-09 DIAGNOSIS — G9340 Encephalopathy, unspecified: Secondary | ICD-10-CM | POA: Diagnosis not present

## 2018-05-09 DIAGNOSIS — R1310 Dysphagia, unspecified: Secondary | ICD-10-CM | POA: Diagnosis not present

## 2018-05-09 DIAGNOSIS — J690 Pneumonitis due to inhalation of food and vomit: Secondary | ICD-10-CM | POA: Diagnosis not present

## 2018-05-09 DIAGNOSIS — K529 Noninfective gastroenteritis and colitis, unspecified: Secondary | ICD-10-CM | POA: Diagnosis not present

## 2018-05-10 DIAGNOSIS — K529 Noninfective gastroenteritis and colitis, unspecified: Secondary | ICD-10-CM | POA: Diagnosis not present

## 2018-05-10 DIAGNOSIS — J690 Pneumonitis due to inhalation of food and vomit: Secondary | ICD-10-CM | POA: Diagnosis not present

## 2018-05-10 DIAGNOSIS — A419 Sepsis, unspecified organism: Secondary | ICD-10-CM | POA: Diagnosis not present

## 2018-05-10 DIAGNOSIS — G309 Alzheimer's disease, unspecified: Secondary | ICD-10-CM | POA: Diagnosis not present

## 2018-05-10 DIAGNOSIS — G9340 Encephalopathy, unspecified: Secondary | ICD-10-CM | POA: Diagnosis not present

## 2018-05-10 DIAGNOSIS — R1310 Dysphagia, unspecified: Secondary | ICD-10-CM | POA: Diagnosis not present

## 2018-05-15 DIAGNOSIS — J690 Pneumonitis due to inhalation of food and vomit: Secondary | ICD-10-CM | POA: Diagnosis not present

## 2018-05-15 DIAGNOSIS — G309 Alzheimer's disease, unspecified: Secondary | ICD-10-CM | POA: Diagnosis not present

## 2018-05-15 DIAGNOSIS — G9340 Encephalopathy, unspecified: Secondary | ICD-10-CM | POA: Diagnosis not present

## 2018-05-15 DIAGNOSIS — K529 Noninfective gastroenteritis and colitis, unspecified: Secondary | ICD-10-CM | POA: Diagnosis not present

## 2018-05-15 DIAGNOSIS — A419 Sepsis, unspecified organism: Secondary | ICD-10-CM | POA: Diagnosis not present

## 2018-05-15 DIAGNOSIS — R1310 Dysphagia, unspecified: Secondary | ICD-10-CM | POA: Diagnosis not present

## 2018-05-16 DIAGNOSIS — G309 Alzheimer's disease, unspecified: Secondary | ICD-10-CM | POA: Diagnosis not present

## 2018-05-16 DIAGNOSIS — G9340 Encephalopathy, unspecified: Secondary | ICD-10-CM | POA: Diagnosis not present

## 2018-05-16 DIAGNOSIS — K529 Noninfective gastroenteritis and colitis, unspecified: Secondary | ICD-10-CM | POA: Diagnosis not present

## 2018-05-16 DIAGNOSIS — A419 Sepsis, unspecified organism: Secondary | ICD-10-CM | POA: Diagnosis not present

## 2018-05-16 DIAGNOSIS — J690 Pneumonitis due to inhalation of food and vomit: Secondary | ICD-10-CM | POA: Diagnosis not present

## 2018-05-16 DIAGNOSIS — R1310 Dysphagia, unspecified: Secondary | ICD-10-CM | POA: Diagnosis not present

## 2018-05-19 DIAGNOSIS — A419 Sepsis, unspecified organism: Secondary | ICD-10-CM | POA: Diagnosis not present

## 2018-05-19 DIAGNOSIS — G309 Alzheimer's disease, unspecified: Secondary | ICD-10-CM | POA: Diagnosis not present

## 2018-05-19 DIAGNOSIS — K529 Noninfective gastroenteritis and colitis, unspecified: Secondary | ICD-10-CM | POA: Diagnosis not present

## 2018-05-19 DIAGNOSIS — G9340 Encephalopathy, unspecified: Secondary | ICD-10-CM | POA: Diagnosis not present

## 2018-05-19 DIAGNOSIS — R1310 Dysphagia, unspecified: Secondary | ICD-10-CM | POA: Diagnosis not present

## 2018-05-19 DIAGNOSIS — J690 Pneumonitis due to inhalation of food and vomit: Secondary | ICD-10-CM | POA: Diagnosis not present

## 2018-05-24 DIAGNOSIS — A419 Sepsis, unspecified organism: Secondary | ICD-10-CM | POA: Diagnosis not present

## 2018-05-24 DIAGNOSIS — J690 Pneumonitis due to inhalation of food and vomit: Secondary | ICD-10-CM | POA: Diagnosis not present

## 2018-05-24 DIAGNOSIS — G309 Alzheimer's disease, unspecified: Secondary | ICD-10-CM | POA: Diagnosis not present

## 2018-05-24 DIAGNOSIS — G9340 Encephalopathy, unspecified: Secondary | ICD-10-CM | POA: Diagnosis not present

## 2018-05-24 DIAGNOSIS — K529 Noninfective gastroenteritis and colitis, unspecified: Secondary | ICD-10-CM | POA: Diagnosis not present

## 2018-05-24 DIAGNOSIS — R1310 Dysphagia, unspecified: Secondary | ICD-10-CM | POA: Diagnosis not present

## 2018-05-29 DIAGNOSIS — G309 Alzheimer's disease, unspecified: Secondary | ICD-10-CM | POA: Diagnosis not present

## 2018-05-29 DIAGNOSIS — I10 Essential (primary) hypertension: Secondary | ICD-10-CM | POA: Diagnosis not present

## 2018-05-29 DIAGNOSIS — F0281 Dementia in other diseases classified elsewhere with behavioral disturbance: Secondary | ICD-10-CM | POA: Diagnosis not present

## 2018-05-29 DIAGNOSIS — E119 Type 2 diabetes mellitus without complications: Secondary | ICD-10-CM | POA: Diagnosis not present

## 2018-05-29 DIAGNOSIS — G9340 Encephalopathy, unspecified: Secondary | ICD-10-CM | POA: Diagnosis not present

## 2018-05-29 DIAGNOSIS — J690 Pneumonitis due to inhalation of food and vomit: Secondary | ICD-10-CM | POA: Diagnosis not present

## 2018-05-29 DIAGNOSIS — E46 Unspecified protein-calorie malnutrition: Secondary | ICD-10-CM | POA: Diagnosis not present

## 2018-05-29 DIAGNOSIS — K529 Noninfective gastroenteritis and colitis, unspecified: Secondary | ICD-10-CM | POA: Diagnosis not present

## 2018-05-29 DIAGNOSIS — R1310 Dysphagia, unspecified: Secondary | ICD-10-CM | POA: Diagnosis not present

## 2018-05-29 DIAGNOSIS — A419 Sepsis, unspecified organism: Secondary | ICD-10-CM | POA: Diagnosis not present

## 2018-05-31 DIAGNOSIS — K529 Noninfective gastroenteritis and colitis, unspecified: Secondary | ICD-10-CM | POA: Diagnosis not present

## 2018-05-31 DIAGNOSIS — G309 Alzheimer's disease, unspecified: Secondary | ICD-10-CM | POA: Diagnosis not present

## 2018-05-31 DIAGNOSIS — J690 Pneumonitis due to inhalation of food and vomit: Secondary | ICD-10-CM | POA: Diagnosis not present

## 2018-05-31 DIAGNOSIS — A419 Sepsis, unspecified organism: Secondary | ICD-10-CM | POA: Diagnosis not present

## 2018-05-31 DIAGNOSIS — G9340 Encephalopathy, unspecified: Secondary | ICD-10-CM | POA: Diagnosis not present

## 2018-05-31 DIAGNOSIS — R1310 Dysphagia, unspecified: Secondary | ICD-10-CM | POA: Diagnosis not present

## 2018-06-02 DIAGNOSIS — J690 Pneumonitis due to inhalation of food and vomit: Secondary | ICD-10-CM | POA: Diagnosis not present

## 2018-06-02 DIAGNOSIS — A419 Sepsis, unspecified organism: Secondary | ICD-10-CM | POA: Diagnosis not present

## 2018-06-02 DIAGNOSIS — G9340 Encephalopathy, unspecified: Secondary | ICD-10-CM | POA: Diagnosis not present

## 2018-06-02 DIAGNOSIS — K529 Noninfective gastroenteritis and colitis, unspecified: Secondary | ICD-10-CM | POA: Diagnosis not present

## 2018-06-02 DIAGNOSIS — G309 Alzheimer's disease, unspecified: Secondary | ICD-10-CM | POA: Diagnosis not present

## 2018-06-02 DIAGNOSIS — R1310 Dysphagia, unspecified: Secondary | ICD-10-CM | POA: Diagnosis not present

## 2018-06-03 DIAGNOSIS — J690 Pneumonitis due to inhalation of food and vomit: Secondary | ICD-10-CM | POA: Diagnosis not present

## 2018-06-03 DIAGNOSIS — G309 Alzheimer's disease, unspecified: Secondary | ICD-10-CM | POA: Diagnosis not present

## 2018-06-03 DIAGNOSIS — G9340 Encephalopathy, unspecified: Secondary | ICD-10-CM | POA: Diagnosis not present

## 2018-06-03 DIAGNOSIS — R1310 Dysphagia, unspecified: Secondary | ICD-10-CM | POA: Diagnosis not present

## 2018-06-03 DIAGNOSIS — K529 Noninfective gastroenteritis and colitis, unspecified: Secondary | ICD-10-CM | POA: Diagnosis not present

## 2018-06-03 DIAGNOSIS — A419 Sepsis, unspecified organism: Secondary | ICD-10-CM | POA: Diagnosis not present

## 2018-06-06 DIAGNOSIS — G9340 Encephalopathy, unspecified: Secondary | ICD-10-CM | POA: Diagnosis not present

## 2018-06-06 DIAGNOSIS — A419 Sepsis, unspecified organism: Secondary | ICD-10-CM | POA: Diagnosis not present

## 2018-06-06 DIAGNOSIS — K529 Noninfective gastroenteritis and colitis, unspecified: Secondary | ICD-10-CM | POA: Diagnosis not present

## 2018-06-06 DIAGNOSIS — G309 Alzheimer's disease, unspecified: Secondary | ICD-10-CM | POA: Diagnosis not present

## 2018-06-06 DIAGNOSIS — R1310 Dysphagia, unspecified: Secondary | ICD-10-CM | POA: Diagnosis not present

## 2018-06-06 DIAGNOSIS — J690 Pneumonitis due to inhalation of food and vomit: Secondary | ICD-10-CM | POA: Diagnosis not present

## 2018-06-13 DIAGNOSIS — K529 Noninfective gastroenteritis and colitis, unspecified: Secondary | ICD-10-CM | POA: Diagnosis not present

## 2018-06-13 DIAGNOSIS — R1310 Dysphagia, unspecified: Secondary | ICD-10-CM | POA: Diagnosis not present

## 2018-06-13 DIAGNOSIS — J690 Pneumonitis due to inhalation of food and vomit: Secondary | ICD-10-CM | POA: Diagnosis not present

## 2018-06-13 DIAGNOSIS — G309 Alzheimer's disease, unspecified: Secondary | ICD-10-CM | POA: Diagnosis not present

## 2018-06-13 DIAGNOSIS — A419 Sepsis, unspecified organism: Secondary | ICD-10-CM | POA: Diagnosis not present

## 2018-06-13 DIAGNOSIS — G9340 Encephalopathy, unspecified: Secondary | ICD-10-CM | POA: Diagnosis not present

## 2018-06-20 DIAGNOSIS — J690 Pneumonitis due to inhalation of food and vomit: Secondary | ICD-10-CM | POA: Diagnosis not present

## 2018-06-20 DIAGNOSIS — A419 Sepsis, unspecified organism: Secondary | ICD-10-CM | POA: Diagnosis not present

## 2018-06-20 DIAGNOSIS — K529 Noninfective gastroenteritis and colitis, unspecified: Secondary | ICD-10-CM | POA: Diagnosis not present

## 2018-06-20 DIAGNOSIS — G309 Alzheimer's disease, unspecified: Secondary | ICD-10-CM | POA: Diagnosis not present

## 2018-06-20 DIAGNOSIS — G9340 Encephalopathy, unspecified: Secondary | ICD-10-CM | POA: Diagnosis not present

## 2018-06-20 DIAGNOSIS — R1310 Dysphagia, unspecified: Secondary | ICD-10-CM | POA: Diagnosis not present

## 2018-06-23 DIAGNOSIS — A419 Sepsis, unspecified organism: Secondary | ICD-10-CM | POA: Diagnosis not present

## 2018-06-23 DIAGNOSIS — K529 Noninfective gastroenteritis and colitis, unspecified: Secondary | ICD-10-CM | POA: Diagnosis not present

## 2018-06-23 DIAGNOSIS — G9340 Encephalopathy, unspecified: Secondary | ICD-10-CM | POA: Diagnosis not present

## 2018-06-23 DIAGNOSIS — J690 Pneumonitis due to inhalation of food and vomit: Secondary | ICD-10-CM | POA: Diagnosis not present

## 2018-06-23 DIAGNOSIS — G309 Alzheimer's disease, unspecified: Secondary | ICD-10-CM | POA: Diagnosis not present

## 2018-06-23 DIAGNOSIS — R1310 Dysphagia, unspecified: Secondary | ICD-10-CM | POA: Diagnosis not present

## 2018-06-28 DIAGNOSIS — G9340 Encephalopathy, unspecified: Secondary | ICD-10-CM | POA: Diagnosis not present

## 2018-06-28 DIAGNOSIS — G309 Alzheimer's disease, unspecified: Secondary | ICD-10-CM | POA: Diagnosis not present

## 2018-06-28 DIAGNOSIS — K529 Noninfective gastroenteritis and colitis, unspecified: Secondary | ICD-10-CM | POA: Diagnosis not present

## 2018-06-28 DIAGNOSIS — E46 Unspecified protein-calorie malnutrition: Secondary | ICD-10-CM | POA: Diagnosis not present

## 2018-06-28 DIAGNOSIS — F0281 Dementia in other diseases classified elsewhere with behavioral disturbance: Secondary | ICD-10-CM | POA: Diagnosis not present

## 2018-06-28 DIAGNOSIS — J690 Pneumonitis due to inhalation of food and vomit: Secondary | ICD-10-CM | POA: Diagnosis not present

## 2018-06-28 DIAGNOSIS — E119 Type 2 diabetes mellitus without complications: Secondary | ICD-10-CM | POA: Diagnosis not present

## 2018-06-28 DIAGNOSIS — I10 Essential (primary) hypertension: Secondary | ICD-10-CM | POA: Diagnosis not present

## 2018-06-28 DIAGNOSIS — R1310 Dysphagia, unspecified: Secondary | ICD-10-CM | POA: Diagnosis not present

## 2018-06-28 DIAGNOSIS — A419 Sepsis, unspecified organism: Secondary | ICD-10-CM | POA: Diagnosis not present

## 2018-06-30 DIAGNOSIS — G9340 Encephalopathy, unspecified: Secondary | ICD-10-CM | POA: Diagnosis not present

## 2018-06-30 DIAGNOSIS — K529 Noninfective gastroenteritis and colitis, unspecified: Secondary | ICD-10-CM | POA: Diagnosis not present

## 2018-06-30 DIAGNOSIS — J690 Pneumonitis due to inhalation of food and vomit: Secondary | ICD-10-CM | POA: Diagnosis not present

## 2018-06-30 DIAGNOSIS — A419 Sepsis, unspecified organism: Secondary | ICD-10-CM | POA: Diagnosis not present

## 2018-06-30 DIAGNOSIS — G309 Alzheimer's disease, unspecified: Secondary | ICD-10-CM | POA: Diagnosis not present

## 2018-06-30 DIAGNOSIS — R1310 Dysphagia, unspecified: Secondary | ICD-10-CM | POA: Diagnosis not present

## 2018-07-05 DIAGNOSIS — K529 Noninfective gastroenteritis and colitis, unspecified: Secondary | ICD-10-CM | POA: Diagnosis not present

## 2018-07-05 DIAGNOSIS — G9340 Encephalopathy, unspecified: Secondary | ICD-10-CM | POA: Diagnosis not present

## 2018-07-05 DIAGNOSIS — A419 Sepsis, unspecified organism: Secondary | ICD-10-CM | POA: Diagnosis not present

## 2018-07-05 DIAGNOSIS — G309 Alzheimer's disease, unspecified: Secondary | ICD-10-CM | POA: Diagnosis not present

## 2018-07-05 DIAGNOSIS — J690 Pneumonitis due to inhalation of food and vomit: Secondary | ICD-10-CM | POA: Diagnosis not present

## 2018-07-05 DIAGNOSIS — R1310 Dysphagia, unspecified: Secondary | ICD-10-CM | POA: Diagnosis not present

## 2018-07-07 DIAGNOSIS — J690 Pneumonitis due to inhalation of food and vomit: Secondary | ICD-10-CM | POA: Diagnosis not present

## 2018-07-07 DIAGNOSIS — G9340 Encephalopathy, unspecified: Secondary | ICD-10-CM | POA: Diagnosis not present

## 2018-07-07 DIAGNOSIS — G309 Alzheimer's disease, unspecified: Secondary | ICD-10-CM | POA: Diagnosis not present

## 2018-07-07 DIAGNOSIS — A419 Sepsis, unspecified organism: Secondary | ICD-10-CM | POA: Diagnosis not present

## 2018-07-07 DIAGNOSIS — R1310 Dysphagia, unspecified: Secondary | ICD-10-CM | POA: Diagnosis not present

## 2018-07-07 DIAGNOSIS — K529 Noninfective gastroenteritis and colitis, unspecified: Secondary | ICD-10-CM | POA: Diagnosis not present

## 2018-07-11 DIAGNOSIS — R1310 Dysphagia, unspecified: Secondary | ICD-10-CM | POA: Diagnosis not present

## 2018-07-11 DIAGNOSIS — A419 Sepsis, unspecified organism: Secondary | ICD-10-CM | POA: Diagnosis not present

## 2018-07-11 DIAGNOSIS — G309 Alzheimer's disease, unspecified: Secondary | ICD-10-CM | POA: Diagnosis not present

## 2018-07-11 DIAGNOSIS — G9340 Encephalopathy, unspecified: Secondary | ICD-10-CM | POA: Diagnosis not present

## 2018-07-11 DIAGNOSIS — J690 Pneumonitis due to inhalation of food and vomit: Secondary | ICD-10-CM | POA: Diagnosis not present

## 2018-07-11 DIAGNOSIS — K529 Noninfective gastroenteritis and colitis, unspecified: Secondary | ICD-10-CM | POA: Diagnosis not present

## 2018-07-19 DIAGNOSIS — K529 Noninfective gastroenteritis and colitis, unspecified: Secondary | ICD-10-CM | POA: Diagnosis not present

## 2018-07-19 DIAGNOSIS — J690 Pneumonitis due to inhalation of food and vomit: Secondary | ICD-10-CM | POA: Diagnosis not present

## 2018-07-19 DIAGNOSIS — G9340 Encephalopathy, unspecified: Secondary | ICD-10-CM | POA: Diagnosis not present

## 2018-07-19 DIAGNOSIS — R1310 Dysphagia, unspecified: Secondary | ICD-10-CM | POA: Diagnosis not present

## 2018-07-19 DIAGNOSIS — A419 Sepsis, unspecified organism: Secondary | ICD-10-CM | POA: Diagnosis not present

## 2018-07-19 DIAGNOSIS — G309 Alzheimer's disease, unspecified: Secondary | ICD-10-CM | POA: Diagnosis not present

## 2018-07-26 DIAGNOSIS — G9340 Encephalopathy, unspecified: Secondary | ICD-10-CM | POA: Diagnosis not present

## 2018-07-26 DIAGNOSIS — A419 Sepsis, unspecified organism: Secondary | ICD-10-CM | POA: Diagnosis not present

## 2018-07-26 DIAGNOSIS — R1310 Dysphagia, unspecified: Secondary | ICD-10-CM | POA: Diagnosis not present

## 2018-07-26 DIAGNOSIS — K529 Noninfective gastroenteritis and colitis, unspecified: Secondary | ICD-10-CM | POA: Diagnosis not present

## 2018-07-26 DIAGNOSIS — J690 Pneumonitis due to inhalation of food and vomit: Secondary | ICD-10-CM | POA: Diagnosis not present

## 2018-07-26 DIAGNOSIS — G309 Alzheimer's disease, unspecified: Secondary | ICD-10-CM | POA: Diagnosis not present

## 2018-07-29 DIAGNOSIS — I10 Essential (primary) hypertension: Secondary | ICD-10-CM | POA: Diagnosis not present

## 2018-07-29 DIAGNOSIS — R1310 Dysphagia, unspecified: Secondary | ICD-10-CM | POA: Diagnosis not present

## 2018-07-29 DIAGNOSIS — G309 Alzheimer's disease, unspecified: Secondary | ICD-10-CM | POA: Diagnosis not present

## 2018-07-29 DIAGNOSIS — E119 Type 2 diabetes mellitus without complications: Secondary | ICD-10-CM | POA: Diagnosis not present

## 2018-07-29 DIAGNOSIS — J690 Pneumonitis due to inhalation of food and vomit: Secondary | ICD-10-CM | POA: Diagnosis not present

## 2018-07-29 DIAGNOSIS — F0281 Dementia in other diseases classified elsewhere with behavioral disturbance: Secondary | ICD-10-CM | POA: Diagnosis not present

## 2018-07-29 DIAGNOSIS — E46 Unspecified protein-calorie malnutrition: Secondary | ICD-10-CM | POA: Diagnosis not present

## 2018-07-29 DIAGNOSIS — K529 Noninfective gastroenteritis and colitis, unspecified: Secondary | ICD-10-CM | POA: Diagnosis not present

## 2018-07-29 DIAGNOSIS — G9340 Encephalopathy, unspecified: Secondary | ICD-10-CM | POA: Diagnosis not present

## 2018-07-29 DIAGNOSIS — A419 Sepsis, unspecified organism: Secondary | ICD-10-CM | POA: Diagnosis not present

## 2018-08-01 DIAGNOSIS — R1310 Dysphagia, unspecified: Secondary | ICD-10-CM | POA: Diagnosis not present

## 2018-08-01 DIAGNOSIS — A419 Sepsis, unspecified organism: Secondary | ICD-10-CM | POA: Diagnosis not present

## 2018-08-01 DIAGNOSIS — G309 Alzheimer's disease, unspecified: Secondary | ICD-10-CM | POA: Diagnosis not present

## 2018-08-01 DIAGNOSIS — J690 Pneumonitis due to inhalation of food and vomit: Secondary | ICD-10-CM | POA: Diagnosis not present

## 2018-08-01 DIAGNOSIS — K529 Noninfective gastroenteritis and colitis, unspecified: Secondary | ICD-10-CM | POA: Diagnosis not present

## 2018-08-01 DIAGNOSIS — G9340 Encephalopathy, unspecified: Secondary | ICD-10-CM | POA: Diagnosis not present

## 2018-08-02 DIAGNOSIS — A419 Sepsis, unspecified organism: Secondary | ICD-10-CM | POA: Diagnosis not present

## 2018-08-02 DIAGNOSIS — J690 Pneumonitis due to inhalation of food and vomit: Secondary | ICD-10-CM | POA: Diagnosis not present

## 2018-08-02 DIAGNOSIS — G309 Alzheimer's disease, unspecified: Secondary | ICD-10-CM | POA: Diagnosis not present

## 2018-08-02 DIAGNOSIS — G9340 Encephalopathy, unspecified: Secondary | ICD-10-CM | POA: Diagnosis not present

## 2018-08-02 DIAGNOSIS — R1310 Dysphagia, unspecified: Secondary | ICD-10-CM | POA: Diagnosis not present

## 2018-08-02 DIAGNOSIS — K529 Noninfective gastroenteritis and colitis, unspecified: Secondary | ICD-10-CM | POA: Diagnosis not present

## 2018-08-09 DIAGNOSIS — R1310 Dysphagia, unspecified: Secondary | ICD-10-CM | POA: Diagnosis not present

## 2018-08-09 DIAGNOSIS — A419 Sepsis, unspecified organism: Secondary | ICD-10-CM | POA: Diagnosis not present

## 2018-08-09 DIAGNOSIS — G309 Alzheimer's disease, unspecified: Secondary | ICD-10-CM | POA: Diagnosis not present

## 2018-08-09 DIAGNOSIS — K529 Noninfective gastroenteritis and colitis, unspecified: Secondary | ICD-10-CM | POA: Diagnosis not present

## 2018-08-09 DIAGNOSIS — G9340 Encephalopathy, unspecified: Secondary | ICD-10-CM | POA: Diagnosis not present

## 2018-08-09 DIAGNOSIS — J690 Pneumonitis due to inhalation of food and vomit: Secondary | ICD-10-CM | POA: Diagnosis not present

## 2018-08-18 DIAGNOSIS — R1310 Dysphagia, unspecified: Secondary | ICD-10-CM | POA: Diagnosis not present

## 2018-08-18 DIAGNOSIS — A419 Sepsis, unspecified organism: Secondary | ICD-10-CM | POA: Diagnosis not present

## 2018-08-18 DIAGNOSIS — J690 Pneumonitis due to inhalation of food and vomit: Secondary | ICD-10-CM | POA: Diagnosis not present

## 2018-08-18 DIAGNOSIS — G9340 Encephalopathy, unspecified: Secondary | ICD-10-CM | POA: Diagnosis not present

## 2018-08-18 DIAGNOSIS — K529 Noninfective gastroenteritis and colitis, unspecified: Secondary | ICD-10-CM | POA: Diagnosis not present

## 2018-08-18 DIAGNOSIS — G309 Alzheimer's disease, unspecified: Secondary | ICD-10-CM | POA: Diagnosis not present

## 2018-08-19 DIAGNOSIS — K529 Noninfective gastroenteritis and colitis, unspecified: Secondary | ICD-10-CM | POA: Diagnosis not present

## 2018-08-19 DIAGNOSIS — G9340 Encephalopathy, unspecified: Secondary | ICD-10-CM | POA: Diagnosis not present

## 2018-08-19 DIAGNOSIS — J690 Pneumonitis due to inhalation of food and vomit: Secondary | ICD-10-CM | POA: Diagnosis not present

## 2018-08-19 DIAGNOSIS — A419 Sepsis, unspecified organism: Secondary | ICD-10-CM | POA: Diagnosis not present

## 2018-08-19 DIAGNOSIS — R1310 Dysphagia, unspecified: Secondary | ICD-10-CM | POA: Diagnosis not present

## 2018-08-19 DIAGNOSIS — G309 Alzheimer's disease, unspecified: Secondary | ICD-10-CM | POA: Diagnosis not present

## 2018-08-22 DIAGNOSIS — J690 Pneumonitis due to inhalation of food and vomit: Secondary | ICD-10-CM | POA: Diagnosis not present

## 2018-08-22 DIAGNOSIS — G9340 Encephalopathy, unspecified: Secondary | ICD-10-CM | POA: Diagnosis not present

## 2018-08-22 DIAGNOSIS — G309 Alzheimer's disease, unspecified: Secondary | ICD-10-CM | POA: Diagnosis not present

## 2018-08-22 DIAGNOSIS — R1310 Dysphagia, unspecified: Secondary | ICD-10-CM | POA: Diagnosis not present

## 2018-08-22 DIAGNOSIS — K529 Noninfective gastroenteritis and colitis, unspecified: Secondary | ICD-10-CM | POA: Diagnosis not present

## 2018-08-22 DIAGNOSIS — A419 Sepsis, unspecified organism: Secondary | ICD-10-CM | POA: Diagnosis not present

## 2018-08-23 DIAGNOSIS — R1310 Dysphagia, unspecified: Secondary | ICD-10-CM | POA: Diagnosis not present

## 2018-08-23 DIAGNOSIS — G309 Alzheimer's disease, unspecified: Secondary | ICD-10-CM | POA: Diagnosis not present

## 2018-08-23 DIAGNOSIS — A419 Sepsis, unspecified organism: Secondary | ICD-10-CM | POA: Diagnosis not present

## 2018-08-23 DIAGNOSIS — K529 Noninfective gastroenteritis and colitis, unspecified: Secondary | ICD-10-CM | POA: Diagnosis not present

## 2018-08-23 DIAGNOSIS — G9340 Encephalopathy, unspecified: Secondary | ICD-10-CM | POA: Diagnosis not present

## 2018-08-23 DIAGNOSIS — J690 Pneumonitis due to inhalation of food and vomit: Secondary | ICD-10-CM | POA: Diagnosis not present

## 2018-08-28 DIAGNOSIS — E119 Type 2 diabetes mellitus without complications: Secondary | ICD-10-CM | POA: Diagnosis not present

## 2018-08-28 DIAGNOSIS — G309 Alzheimer's disease, unspecified: Secondary | ICD-10-CM | POA: Diagnosis not present

## 2018-08-28 DIAGNOSIS — F0281 Dementia in other diseases classified elsewhere with behavioral disturbance: Secondary | ICD-10-CM | POA: Diagnosis not present

## 2018-08-28 DIAGNOSIS — R1310 Dysphagia, unspecified: Secondary | ICD-10-CM | POA: Diagnosis not present

## 2018-08-28 DIAGNOSIS — E46 Unspecified protein-calorie malnutrition: Secondary | ICD-10-CM | POA: Diagnosis not present

## 2018-08-28 DIAGNOSIS — I10 Essential (primary) hypertension: Secondary | ICD-10-CM | POA: Diagnosis not present

## 2018-08-28 DIAGNOSIS — J690 Pneumonitis due to inhalation of food and vomit: Secondary | ICD-10-CM | POA: Diagnosis not present

## 2018-08-28 DIAGNOSIS — K529 Noninfective gastroenteritis and colitis, unspecified: Secondary | ICD-10-CM | POA: Diagnosis not present

## 2018-08-28 DIAGNOSIS — G9340 Encephalopathy, unspecified: Secondary | ICD-10-CM | POA: Diagnosis not present

## 2018-08-28 DIAGNOSIS — A419 Sepsis, unspecified organism: Secondary | ICD-10-CM | POA: Diagnosis not present

## 2018-08-30 DIAGNOSIS — J690 Pneumonitis due to inhalation of food and vomit: Secondary | ICD-10-CM | POA: Diagnosis not present

## 2018-08-30 DIAGNOSIS — R1310 Dysphagia, unspecified: Secondary | ICD-10-CM | POA: Diagnosis not present

## 2018-08-30 DIAGNOSIS — A419 Sepsis, unspecified organism: Secondary | ICD-10-CM | POA: Diagnosis not present

## 2018-08-30 DIAGNOSIS — G9340 Encephalopathy, unspecified: Secondary | ICD-10-CM | POA: Diagnosis not present

## 2018-08-30 DIAGNOSIS — G309 Alzheimer's disease, unspecified: Secondary | ICD-10-CM | POA: Diagnosis not present

## 2018-08-30 DIAGNOSIS — K529 Noninfective gastroenteritis and colitis, unspecified: Secondary | ICD-10-CM | POA: Diagnosis not present

## 2018-09-06 DIAGNOSIS — J690 Pneumonitis due to inhalation of food and vomit: Secondary | ICD-10-CM | POA: Diagnosis not present

## 2018-09-06 DIAGNOSIS — G309 Alzheimer's disease, unspecified: Secondary | ICD-10-CM | POA: Diagnosis not present

## 2018-09-06 DIAGNOSIS — K529 Noninfective gastroenteritis and colitis, unspecified: Secondary | ICD-10-CM | POA: Diagnosis not present

## 2018-09-06 DIAGNOSIS — A419 Sepsis, unspecified organism: Secondary | ICD-10-CM | POA: Diagnosis not present

## 2018-09-06 DIAGNOSIS — G9340 Encephalopathy, unspecified: Secondary | ICD-10-CM | POA: Diagnosis not present

## 2018-09-06 DIAGNOSIS — R1310 Dysphagia, unspecified: Secondary | ICD-10-CM | POA: Diagnosis not present

## 2018-09-13 DIAGNOSIS — J690 Pneumonitis due to inhalation of food and vomit: Secondary | ICD-10-CM | POA: Diagnosis not present

## 2018-09-13 DIAGNOSIS — R1310 Dysphagia, unspecified: Secondary | ICD-10-CM | POA: Diagnosis not present

## 2018-09-13 DIAGNOSIS — G9340 Encephalopathy, unspecified: Secondary | ICD-10-CM | POA: Diagnosis not present

## 2018-09-13 DIAGNOSIS — G309 Alzheimer's disease, unspecified: Secondary | ICD-10-CM | POA: Diagnosis not present

## 2018-09-13 DIAGNOSIS — K529 Noninfective gastroenteritis and colitis, unspecified: Secondary | ICD-10-CM | POA: Diagnosis not present

## 2018-09-13 DIAGNOSIS — A419 Sepsis, unspecified organism: Secondary | ICD-10-CM | POA: Diagnosis not present

## 2018-09-19 DIAGNOSIS — A419 Sepsis, unspecified organism: Secondary | ICD-10-CM | POA: Diagnosis not present

## 2018-09-19 DIAGNOSIS — J690 Pneumonitis due to inhalation of food and vomit: Secondary | ICD-10-CM | POA: Diagnosis not present

## 2018-09-19 DIAGNOSIS — K529 Noninfective gastroenteritis and colitis, unspecified: Secondary | ICD-10-CM | POA: Diagnosis not present

## 2018-09-19 DIAGNOSIS — G309 Alzheimer's disease, unspecified: Secondary | ICD-10-CM | POA: Diagnosis not present

## 2018-09-19 DIAGNOSIS — G9340 Encephalopathy, unspecified: Secondary | ICD-10-CM | POA: Diagnosis not present

## 2018-09-19 DIAGNOSIS — R1310 Dysphagia, unspecified: Secondary | ICD-10-CM | POA: Diagnosis not present

## 2018-09-20 DIAGNOSIS — A419 Sepsis, unspecified organism: Secondary | ICD-10-CM | POA: Diagnosis not present

## 2018-09-20 DIAGNOSIS — K529 Noninfective gastroenteritis and colitis, unspecified: Secondary | ICD-10-CM | POA: Diagnosis not present

## 2018-09-20 DIAGNOSIS — R1310 Dysphagia, unspecified: Secondary | ICD-10-CM | POA: Diagnosis not present

## 2018-09-20 DIAGNOSIS — G309 Alzheimer's disease, unspecified: Secondary | ICD-10-CM | POA: Diagnosis not present

## 2018-09-20 DIAGNOSIS — G9340 Encephalopathy, unspecified: Secondary | ICD-10-CM | POA: Diagnosis not present

## 2018-09-20 DIAGNOSIS — J690 Pneumonitis due to inhalation of food and vomit: Secondary | ICD-10-CM | POA: Diagnosis not present

## 2018-09-26 DIAGNOSIS — J690 Pneumonitis due to inhalation of food and vomit: Secondary | ICD-10-CM | POA: Diagnosis not present

## 2018-09-26 DIAGNOSIS — R1310 Dysphagia, unspecified: Secondary | ICD-10-CM | POA: Diagnosis not present

## 2018-09-26 DIAGNOSIS — K529 Noninfective gastroenteritis and colitis, unspecified: Secondary | ICD-10-CM | POA: Diagnosis not present

## 2018-09-26 DIAGNOSIS — A419 Sepsis, unspecified organism: Secondary | ICD-10-CM | POA: Diagnosis not present

## 2018-09-26 DIAGNOSIS — G9340 Encephalopathy, unspecified: Secondary | ICD-10-CM | POA: Diagnosis not present

## 2018-09-26 DIAGNOSIS — G309 Alzheimer's disease, unspecified: Secondary | ICD-10-CM | POA: Diagnosis not present

## 2018-09-27 DIAGNOSIS — G309 Alzheimer's disease, unspecified: Secondary | ICD-10-CM | POA: Diagnosis not present

## 2018-09-27 DIAGNOSIS — G9340 Encephalopathy, unspecified: Secondary | ICD-10-CM | POA: Diagnosis not present

## 2018-09-27 DIAGNOSIS — R1310 Dysphagia, unspecified: Secondary | ICD-10-CM | POA: Diagnosis not present

## 2018-09-27 DIAGNOSIS — A419 Sepsis, unspecified organism: Secondary | ICD-10-CM | POA: Diagnosis not present

## 2018-09-27 DIAGNOSIS — K529 Noninfective gastroenteritis and colitis, unspecified: Secondary | ICD-10-CM | POA: Diagnosis not present

## 2018-09-27 DIAGNOSIS — J690 Pneumonitis due to inhalation of food and vomit: Secondary | ICD-10-CM | POA: Diagnosis not present

## 2018-09-28 DIAGNOSIS — G309 Alzheimer's disease, unspecified: Secondary | ICD-10-CM | POA: Diagnosis not present

## 2018-09-28 DIAGNOSIS — J690 Pneumonitis due to inhalation of food and vomit: Secondary | ICD-10-CM | POA: Diagnosis not present

## 2018-09-28 DIAGNOSIS — E46 Unspecified protein-calorie malnutrition: Secondary | ICD-10-CM | POA: Diagnosis not present

## 2018-09-28 DIAGNOSIS — R1310 Dysphagia, unspecified: Secondary | ICD-10-CM | POA: Diagnosis not present

## 2018-09-28 DIAGNOSIS — A419 Sepsis, unspecified organism: Secondary | ICD-10-CM | POA: Diagnosis not present

## 2018-09-28 DIAGNOSIS — G9340 Encephalopathy, unspecified: Secondary | ICD-10-CM | POA: Diagnosis not present

## 2018-09-28 DIAGNOSIS — E119 Type 2 diabetes mellitus without complications: Secondary | ICD-10-CM | POA: Diagnosis not present

## 2018-09-28 DIAGNOSIS — F0281 Dementia in other diseases classified elsewhere with behavioral disturbance: Secondary | ICD-10-CM | POA: Diagnosis not present

## 2018-09-28 DIAGNOSIS — I10 Essential (primary) hypertension: Secondary | ICD-10-CM | POA: Diagnosis not present

## 2018-09-28 DIAGNOSIS — K529 Noninfective gastroenteritis and colitis, unspecified: Secondary | ICD-10-CM | POA: Diagnosis not present

## 2018-10-03 DIAGNOSIS — R1310 Dysphagia, unspecified: Secondary | ICD-10-CM | POA: Diagnosis not present

## 2018-10-03 DIAGNOSIS — K529 Noninfective gastroenteritis and colitis, unspecified: Secondary | ICD-10-CM | POA: Diagnosis not present

## 2018-10-03 DIAGNOSIS — A419 Sepsis, unspecified organism: Secondary | ICD-10-CM | POA: Diagnosis not present

## 2018-10-03 DIAGNOSIS — G9340 Encephalopathy, unspecified: Secondary | ICD-10-CM | POA: Diagnosis not present

## 2018-10-03 DIAGNOSIS — G309 Alzheimer's disease, unspecified: Secondary | ICD-10-CM | POA: Diagnosis not present

## 2018-10-03 DIAGNOSIS — J690 Pneumonitis due to inhalation of food and vomit: Secondary | ICD-10-CM | POA: Diagnosis not present

## 2018-10-06 DIAGNOSIS — G9340 Encephalopathy, unspecified: Secondary | ICD-10-CM | POA: Diagnosis not present

## 2018-10-06 DIAGNOSIS — K529 Noninfective gastroenteritis and colitis, unspecified: Secondary | ICD-10-CM | POA: Diagnosis not present

## 2018-10-06 DIAGNOSIS — G309 Alzheimer's disease, unspecified: Secondary | ICD-10-CM | POA: Diagnosis not present

## 2018-10-06 DIAGNOSIS — A419 Sepsis, unspecified organism: Secondary | ICD-10-CM | POA: Diagnosis not present

## 2018-10-06 DIAGNOSIS — J690 Pneumonitis due to inhalation of food and vomit: Secondary | ICD-10-CM | POA: Diagnosis not present

## 2018-10-06 DIAGNOSIS — R1310 Dysphagia, unspecified: Secondary | ICD-10-CM | POA: Diagnosis not present

## 2018-10-10 DIAGNOSIS — J690 Pneumonitis due to inhalation of food and vomit: Secondary | ICD-10-CM | POA: Diagnosis not present

## 2018-10-10 DIAGNOSIS — A419 Sepsis, unspecified organism: Secondary | ICD-10-CM | POA: Diagnosis not present

## 2018-10-10 DIAGNOSIS — R1310 Dysphagia, unspecified: Secondary | ICD-10-CM | POA: Diagnosis not present

## 2018-10-10 DIAGNOSIS — K529 Noninfective gastroenteritis and colitis, unspecified: Secondary | ICD-10-CM | POA: Diagnosis not present

## 2018-10-10 DIAGNOSIS — G9340 Encephalopathy, unspecified: Secondary | ICD-10-CM | POA: Diagnosis not present

## 2018-10-10 DIAGNOSIS — G309 Alzheimer's disease, unspecified: Secondary | ICD-10-CM | POA: Diagnosis not present

## 2018-10-17 DIAGNOSIS — R1310 Dysphagia, unspecified: Secondary | ICD-10-CM | POA: Diagnosis not present

## 2018-10-17 DIAGNOSIS — G309 Alzheimer's disease, unspecified: Secondary | ICD-10-CM | POA: Diagnosis not present

## 2018-10-17 DIAGNOSIS — J690 Pneumonitis due to inhalation of food and vomit: Secondary | ICD-10-CM | POA: Diagnosis not present

## 2018-10-17 DIAGNOSIS — A419 Sepsis, unspecified organism: Secondary | ICD-10-CM | POA: Diagnosis not present

## 2018-10-17 DIAGNOSIS — G9340 Encephalopathy, unspecified: Secondary | ICD-10-CM | POA: Diagnosis not present

## 2018-10-17 DIAGNOSIS — K529 Noninfective gastroenteritis and colitis, unspecified: Secondary | ICD-10-CM | POA: Diagnosis not present

## 2018-10-18 DIAGNOSIS — J690 Pneumonitis due to inhalation of food and vomit: Secondary | ICD-10-CM | POA: Diagnosis not present

## 2018-10-18 DIAGNOSIS — A419 Sepsis, unspecified organism: Secondary | ICD-10-CM | POA: Diagnosis not present

## 2018-10-18 DIAGNOSIS — R1310 Dysphagia, unspecified: Secondary | ICD-10-CM | POA: Diagnosis not present

## 2018-10-18 DIAGNOSIS — G309 Alzheimer's disease, unspecified: Secondary | ICD-10-CM | POA: Diagnosis not present

## 2018-10-18 DIAGNOSIS — G9340 Encephalopathy, unspecified: Secondary | ICD-10-CM | POA: Diagnosis not present

## 2018-10-18 DIAGNOSIS — K529 Noninfective gastroenteritis and colitis, unspecified: Secondary | ICD-10-CM | POA: Diagnosis not present

## 2018-10-24 DIAGNOSIS — G309 Alzheimer's disease, unspecified: Secondary | ICD-10-CM | POA: Diagnosis not present

## 2018-10-24 DIAGNOSIS — K529 Noninfective gastroenteritis and colitis, unspecified: Secondary | ICD-10-CM | POA: Diagnosis not present

## 2018-10-24 DIAGNOSIS — J690 Pneumonitis due to inhalation of food and vomit: Secondary | ICD-10-CM | POA: Diagnosis not present

## 2018-10-24 DIAGNOSIS — R1310 Dysphagia, unspecified: Secondary | ICD-10-CM | POA: Diagnosis not present

## 2018-10-24 DIAGNOSIS — G9340 Encephalopathy, unspecified: Secondary | ICD-10-CM | POA: Diagnosis not present

## 2018-10-24 DIAGNOSIS — A419 Sepsis, unspecified organism: Secondary | ICD-10-CM | POA: Diagnosis not present

## 2018-10-29 DIAGNOSIS — R1310 Dysphagia, unspecified: Secondary | ICD-10-CM | POA: Diagnosis not present

## 2018-10-29 DIAGNOSIS — F0281 Dementia in other diseases classified elsewhere with behavioral disturbance: Secondary | ICD-10-CM | POA: Diagnosis not present

## 2018-10-29 DIAGNOSIS — I10 Essential (primary) hypertension: Secondary | ICD-10-CM | POA: Diagnosis not present

## 2018-10-29 DIAGNOSIS — E119 Type 2 diabetes mellitus without complications: Secondary | ICD-10-CM | POA: Diagnosis not present

## 2018-10-29 DIAGNOSIS — G9340 Encephalopathy, unspecified: Secondary | ICD-10-CM | POA: Diagnosis not present

## 2018-10-29 DIAGNOSIS — J690 Pneumonitis due to inhalation of food and vomit: Secondary | ICD-10-CM | POA: Diagnosis not present

## 2018-10-29 DIAGNOSIS — G309 Alzheimer's disease, unspecified: Secondary | ICD-10-CM | POA: Diagnosis not present

## 2018-10-29 DIAGNOSIS — A419 Sepsis, unspecified organism: Secondary | ICD-10-CM | POA: Diagnosis not present

## 2018-10-29 DIAGNOSIS — K529 Noninfective gastroenteritis and colitis, unspecified: Secondary | ICD-10-CM | POA: Diagnosis not present

## 2018-10-29 DIAGNOSIS — E46 Unspecified protein-calorie malnutrition: Secondary | ICD-10-CM | POA: Diagnosis not present

## 2018-10-31 DIAGNOSIS — G309 Alzheimer's disease, unspecified: Secondary | ICD-10-CM | POA: Diagnosis not present

## 2018-10-31 DIAGNOSIS — A419 Sepsis, unspecified organism: Secondary | ICD-10-CM | POA: Diagnosis not present

## 2018-10-31 DIAGNOSIS — G9340 Encephalopathy, unspecified: Secondary | ICD-10-CM | POA: Diagnosis not present

## 2018-10-31 DIAGNOSIS — J690 Pneumonitis due to inhalation of food and vomit: Secondary | ICD-10-CM | POA: Diagnosis not present

## 2018-10-31 DIAGNOSIS — K529 Noninfective gastroenteritis and colitis, unspecified: Secondary | ICD-10-CM | POA: Diagnosis not present

## 2018-10-31 DIAGNOSIS — R1310 Dysphagia, unspecified: Secondary | ICD-10-CM | POA: Diagnosis not present

## 2018-11-02 DIAGNOSIS — A419 Sepsis, unspecified organism: Secondary | ICD-10-CM | POA: Diagnosis not present

## 2018-11-02 DIAGNOSIS — G9340 Encephalopathy, unspecified: Secondary | ICD-10-CM | POA: Diagnosis not present

## 2018-11-02 DIAGNOSIS — K529 Noninfective gastroenteritis and colitis, unspecified: Secondary | ICD-10-CM | POA: Diagnosis not present

## 2018-11-02 DIAGNOSIS — J690 Pneumonitis due to inhalation of food and vomit: Secondary | ICD-10-CM | POA: Diagnosis not present

## 2018-11-02 DIAGNOSIS — G309 Alzheimer's disease, unspecified: Secondary | ICD-10-CM | POA: Diagnosis not present

## 2018-11-02 DIAGNOSIS — R1310 Dysphagia, unspecified: Secondary | ICD-10-CM | POA: Diagnosis not present

## 2018-11-07 DIAGNOSIS — G309 Alzheimer's disease, unspecified: Secondary | ICD-10-CM | POA: Diagnosis not present

## 2018-11-07 DIAGNOSIS — J690 Pneumonitis due to inhalation of food and vomit: Secondary | ICD-10-CM | POA: Diagnosis not present

## 2018-11-07 DIAGNOSIS — R1310 Dysphagia, unspecified: Secondary | ICD-10-CM | POA: Diagnosis not present

## 2018-11-07 DIAGNOSIS — A419 Sepsis, unspecified organism: Secondary | ICD-10-CM | POA: Diagnosis not present

## 2018-11-07 DIAGNOSIS — G9340 Encephalopathy, unspecified: Secondary | ICD-10-CM | POA: Diagnosis not present

## 2018-11-07 DIAGNOSIS — K529 Noninfective gastroenteritis and colitis, unspecified: Secondary | ICD-10-CM | POA: Diagnosis not present

## 2018-11-14 DIAGNOSIS — G309 Alzheimer's disease, unspecified: Secondary | ICD-10-CM | POA: Diagnosis not present

## 2018-11-14 DIAGNOSIS — K529 Noninfective gastroenteritis and colitis, unspecified: Secondary | ICD-10-CM | POA: Diagnosis not present

## 2018-11-14 DIAGNOSIS — A419 Sepsis, unspecified organism: Secondary | ICD-10-CM | POA: Diagnosis not present

## 2018-11-14 DIAGNOSIS — R1310 Dysphagia, unspecified: Secondary | ICD-10-CM | POA: Diagnosis not present

## 2018-11-14 DIAGNOSIS — G9340 Encephalopathy, unspecified: Secondary | ICD-10-CM | POA: Diagnosis not present

## 2018-11-14 DIAGNOSIS — J690 Pneumonitis due to inhalation of food and vomit: Secondary | ICD-10-CM | POA: Diagnosis not present

## 2018-11-18 DIAGNOSIS — R1310 Dysphagia, unspecified: Secondary | ICD-10-CM | POA: Diagnosis not present

## 2018-11-18 DIAGNOSIS — A419 Sepsis, unspecified organism: Secondary | ICD-10-CM | POA: Diagnosis not present

## 2018-11-18 DIAGNOSIS — G309 Alzheimer's disease, unspecified: Secondary | ICD-10-CM | POA: Diagnosis not present

## 2018-11-18 DIAGNOSIS — J690 Pneumonitis due to inhalation of food and vomit: Secondary | ICD-10-CM | POA: Diagnosis not present

## 2018-11-18 DIAGNOSIS — G9340 Encephalopathy, unspecified: Secondary | ICD-10-CM | POA: Diagnosis not present

## 2018-11-18 DIAGNOSIS — K529 Noninfective gastroenteritis and colitis, unspecified: Secondary | ICD-10-CM | POA: Diagnosis not present

## 2018-11-21 DIAGNOSIS — G309 Alzheimer's disease, unspecified: Secondary | ICD-10-CM | POA: Diagnosis not present

## 2018-11-21 DIAGNOSIS — J690 Pneumonitis due to inhalation of food and vomit: Secondary | ICD-10-CM | POA: Diagnosis not present

## 2018-11-21 DIAGNOSIS — G9340 Encephalopathy, unspecified: Secondary | ICD-10-CM | POA: Diagnosis not present

## 2018-11-21 DIAGNOSIS — R1310 Dysphagia, unspecified: Secondary | ICD-10-CM | POA: Diagnosis not present

## 2018-11-21 DIAGNOSIS — K529 Noninfective gastroenteritis and colitis, unspecified: Secondary | ICD-10-CM | POA: Diagnosis not present

## 2018-11-21 DIAGNOSIS — A419 Sepsis, unspecified organism: Secondary | ICD-10-CM | POA: Diagnosis not present

## 2018-11-27 DIAGNOSIS — F0281 Dementia in other diseases classified elsewhere with behavioral disturbance: Secondary | ICD-10-CM | POA: Diagnosis not present

## 2018-11-27 DIAGNOSIS — I10 Essential (primary) hypertension: Secondary | ICD-10-CM | POA: Diagnosis not present

## 2018-11-27 DIAGNOSIS — E119 Type 2 diabetes mellitus without complications: Secondary | ICD-10-CM | POA: Diagnosis not present

## 2018-11-27 DIAGNOSIS — J690 Pneumonitis due to inhalation of food and vomit: Secondary | ICD-10-CM | POA: Diagnosis not present

## 2018-11-27 DIAGNOSIS — K529 Noninfective gastroenteritis and colitis, unspecified: Secondary | ICD-10-CM | POA: Diagnosis not present

## 2018-11-27 DIAGNOSIS — G309 Alzheimer's disease, unspecified: Secondary | ICD-10-CM | POA: Diagnosis not present

## 2018-11-27 DIAGNOSIS — E46 Unspecified protein-calorie malnutrition: Secondary | ICD-10-CM | POA: Diagnosis not present

## 2018-11-27 DIAGNOSIS — R1310 Dysphagia, unspecified: Secondary | ICD-10-CM | POA: Diagnosis not present

## 2018-11-27 DIAGNOSIS — G9340 Encephalopathy, unspecified: Secondary | ICD-10-CM | POA: Diagnosis not present

## 2018-11-27 DIAGNOSIS — A419 Sepsis, unspecified organism: Secondary | ICD-10-CM | POA: Diagnosis not present

## 2018-11-28 DIAGNOSIS — G309 Alzheimer's disease, unspecified: Secondary | ICD-10-CM | POA: Diagnosis not present

## 2018-11-28 DIAGNOSIS — R1310 Dysphagia, unspecified: Secondary | ICD-10-CM | POA: Diagnosis not present

## 2018-11-28 DIAGNOSIS — F0281 Dementia in other diseases classified elsewhere with behavioral disturbance: Secondary | ICD-10-CM | POA: Diagnosis not present

## 2018-11-28 DIAGNOSIS — J690 Pneumonitis due to inhalation of food and vomit: Secondary | ICD-10-CM | POA: Diagnosis not present

## 2018-11-28 DIAGNOSIS — A419 Sepsis, unspecified organism: Secondary | ICD-10-CM | POA: Diagnosis not present

## 2018-11-28 DIAGNOSIS — K529 Noninfective gastroenteritis and colitis, unspecified: Secondary | ICD-10-CM | POA: Diagnosis not present

## 2018-11-30 DIAGNOSIS — R1310 Dysphagia, unspecified: Secondary | ICD-10-CM | POA: Diagnosis not present

## 2018-11-30 DIAGNOSIS — K529 Noninfective gastroenteritis and colitis, unspecified: Secondary | ICD-10-CM | POA: Diagnosis not present

## 2018-11-30 DIAGNOSIS — J690 Pneumonitis due to inhalation of food and vomit: Secondary | ICD-10-CM | POA: Diagnosis not present

## 2018-11-30 DIAGNOSIS — F0281 Dementia in other diseases classified elsewhere with behavioral disturbance: Secondary | ICD-10-CM | POA: Diagnosis not present

## 2018-11-30 DIAGNOSIS — G309 Alzheimer's disease, unspecified: Secondary | ICD-10-CM | POA: Diagnosis not present

## 2018-11-30 DIAGNOSIS — A419 Sepsis, unspecified organism: Secondary | ICD-10-CM | POA: Diagnosis not present

## 2018-12-05 DIAGNOSIS — R1310 Dysphagia, unspecified: Secondary | ICD-10-CM | POA: Diagnosis not present

## 2018-12-05 DIAGNOSIS — J690 Pneumonitis due to inhalation of food and vomit: Secondary | ICD-10-CM | POA: Diagnosis not present

## 2018-12-05 DIAGNOSIS — A419 Sepsis, unspecified organism: Secondary | ICD-10-CM | POA: Diagnosis not present

## 2018-12-05 DIAGNOSIS — K529 Noninfective gastroenteritis and colitis, unspecified: Secondary | ICD-10-CM | POA: Diagnosis not present

## 2018-12-05 DIAGNOSIS — F0281 Dementia in other diseases classified elsewhere with behavioral disturbance: Secondary | ICD-10-CM | POA: Diagnosis not present

## 2018-12-05 DIAGNOSIS — G309 Alzheimer's disease, unspecified: Secondary | ICD-10-CM | POA: Diagnosis not present

## 2018-12-12 DIAGNOSIS — F0281 Dementia in other diseases classified elsewhere with behavioral disturbance: Secondary | ICD-10-CM | POA: Diagnosis not present

## 2018-12-12 DIAGNOSIS — J690 Pneumonitis due to inhalation of food and vomit: Secondary | ICD-10-CM | POA: Diagnosis not present

## 2018-12-12 DIAGNOSIS — K529 Noninfective gastroenteritis and colitis, unspecified: Secondary | ICD-10-CM | POA: Diagnosis not present

## 2018-12-12 DIAGNOSIS — G309 Alzheimer's disease, unspecified: Secondary | ICD-10-CM | POA: Diagnosis not present

## 2018-12-12 DIAGNOSIS — R1310 Dysphagia, unspecified: Secondary | ICD-10-CM | POA: Diagnosis not present

## 2018-12-12 DIAGNOSIS — A419 Sepsis, unspecified organism: Secondary | ICD-10-CM | POA: Diagnosis not present

## 2018-12-19 DIAGNOSIS — K529 Noninfective gastroenteritis and colitis, unspecified: Secondary | ICD-10-CM | POA: Diagnosis not present

## 2018-12-19 DIAGNOSIS — J690 Pneumonitis due to inhalation of food and vomit: Secondary | ICD-10-CM | POA: Diagnosis not present

## 2018-12-19 DIAGNOSIS — G309 Alzheimer's disease, unspecified: Secondary | ICD-10-CM | POA: Diagnosis not present

## 2018-12-19 DIAGNOSIS — A419 Sepsis, unspecified organism: Secondary | ICD-10-CM | POA: Diagnosis not present

## 2018-12-19 DIAGNOSIS — R1310 Dysphagia, unspecified: Secondary | ICD-10-CM | POA: Diagnosis not present

## 2018-12-19 DIAGNOSIS — F0281 Dementia in other diseases classified elsewhere with behavioral disturbance: Secondary | ICD-10-CM | POA: Diagnosis not present

## 2018-12-20 DIAGNOSIS — R1310 Dysphagia, unspecified: Secondary | ICD-10-CM | POA: Diagnosis not present

## 2018-12-20 DIAGNOSIS — F0281 Dementia in other diseases classified elsewhere with behavioral disturbance: Secondary | ICD-10-CM | POA: Diagnosis not present

## 2018-12-20 DIAGNOSIS — J690 Pneumonitis due to inhalation of food and vomit: Secondary | ICD-10-CM | POA: Diagnosis not present

## 2018-12-20 DIAGNOSIS — K529 Noninfective gastroenteritis and colitis, unspecified: Secondary | ICD-10-CM | POA: Diagnosis not present

## 2018-12-20 DIAGNOSIS — A419 Sepsis, unspecified organism: Secondary | ICD-10-CM | POA: Diagnosis not present

## 2018-12-20 DIAGNOSIS — G309 Alzheimer's disease, unspecified: Secondary | ICD-10-CM | POA: Diagnosis not present

## 2018-12-26 DIAGNOSIS — K529 Noninfective gastroenteritis and colitis, unspecified: Secondary | ICD-10-CM | POA: Diagnosis not present

## 2018-12-26 DIAGNOSIS — G309 Alzheimer's disease, unspecified: Secondary | ICD-10-CM | POA: Diagnosis not present

## 2018-12-26 DIAGNOSIS — J690 Pneumonitis due to inhalation of food and vomit: Secondary | ICD-10-CM | POA: Diagnosis not present

## 2018-12-26 DIAGNOSIS — A419 Sepsis, unspecified organism: Secondary | ICD-10-CM | POA: Diagnosis not present

## 2018-12-26 DIAGNOSIS — R1310 Dysphagia, unspecified: Secondary | ICD-10-CM | POA: Diagnosis not present

## 2018-12-26 DIAGNOSIS — F0281 Dementia in other diseases classified elsewhere with behavioral disturbance: Secondary | ICD-10-CM | POA: Diagnosis not present

## 2018-12-28 DIAGNOSIS — R1310 Dysphagia, unspecified: Secondary | ICD-10-CM | POA: Diagnosis not present

## 2018-12-28 DIAGNOSIS — I10 Essential (primary) hypertension: Secondary | ICD-10-CM | POA: Diagnosis not present

## 2018-12-28 DIAGNOSIS — E46 Unspecified protein-calorie malnutrition: Secondary | ICD-10-CM | POA: Diagnosis not present

## 2018-12-28 DIAGNOSIS — F0281 Dementia in other diseases classified elsewhere with behavioral disturbance: Secondary | ICD-10-CM | POA: Diagnosis not present

## 2018-12-28 DIAGNOSIS — J690 Pneumonitis due to inhalation of food and vomit: Secondary | ICD-10-CM | POA: Diagnosis not present

## 2018-12-28 DIAGNOSIS — K529 Noninfective gastroenteritis and colitis, unspecified: Secondary | ICD-10-CM | POA: Diagnosis not present

## 2018-12-28 DIAGNOSIS — E119 Type 2 diabetes mellitus without complications: Secondary | ICD-10-CM | POA: Diagnosis not present

## 2018-12-28 DIAGNOSIS — G9340 Encephalopathy, unspecified: Secondary | ICD-10-CM | POA: Diagnosis not present

## 2018-12-28 DIAGNOSIS — G309 Alzheimer's disease, unspecified: Secondary | ICD-10-CM | POA: Diagnosis not present

## 2018-12-28 DIAGNOSIS — A419 Sepsis, unspecified organism: Secondary | ICD-10-CM | POA: Diagnosis not present

## 2019-01-10 DIAGNOSIS — R1310 Dysphagia, unspecified: Secondary | ICD-10-CM | POA: Diagnosis not present

## 2019-01-10 DIAGNOSIS — A419 Sepsis, unspecified organism: Secondary | ICD-10-CM | POA: Diagnosis not present

## 2019-01-10 DIAGNOSIS — G309 Alzheimer's disease, unspecified: Secondary | ICD-10-CM | POA: Diagnosis not present

## 2019-01-10 DIAGNOSIS — K529 Noninfective gastroenteritis and colitis, unspecified: Secondary | ICD-10-CM | POA: Diagnosis not present

## 2019-01-10 DIAGNOSIS — J690 Pneumonitis due to inhalation of food and vomit: Secondary | ICD-10-CM | POA: Diagnosis not present

## 2019-01-10 DIAGNOSIS — F0281 Dementia in other diseases classified elsewhere with behavioral disturbance: Secondary | ICD-10-CM | POA: Diagnosis not present

## 2019-01-27 DIAGNOSIS — E119 Type 2 diabetes mellitus without complications: Secondary | ICD-10-CM | POA: Diagnosis not present

## 2019-01-27 DIAGNOSIS — J690 Pneumonitis due to inhalation of food and vomit: Secondary | ICD-10-CM | POA: Diagnosis not present

## 2019-01-27 DIAGNOSIS — I10 Essential (primary) hypertension: Secondary | ICD-10-CM | POA: Diagnosis not present

## 2019-01-27 DIAGNOSIS — F0281 Dementia in other diseases classified elsewhere with behavioral disturbance: Secondary | ICD-10-CM | POA: Diagnosis not present

## 2019-01-27 DIAGNOSIS — E46 Unspecified protein-calorie malnutrition: Secondary | ICD-10-CM | POA: Diagnosis not present

## 2019-01-27 DIAGNOSIS — R1310 Dysphagia, unspecified: Secondary | ICD-10-CM | POA: Diagnosis not present

## 2019-01-27 DIAGNOSIS — G9340 Encephalopathy, unspecified: Secondary | ICD-10-CM | POA: Diagnosis not present

## 2019-01-27 DIAGNOSIS — G309 Alzheimer's disease, unspecified: Secondary | ICD-10-CM | POA: Diagnosis not present

## 2019-01-27 DIAGNOSIS — K529 Noninfective gastroenteritis and colitis, unspecified: Secondary | ICD-10-CM | POA: Diagnosis not present

## 2019-01-27 DIAGNOSIS — A419 Sepsis, unspecified organism: Secondary | ICD-10-CM | POA: Diagnosis not present

## 2019-01-30 DIAGNOSIS — A419 Sepsis, unspecified organism: Secondary | ICD-10-CM | POA: Diagnosis not present

## 2019-01-30 DIAGNOSIS — K529 Noninfective gastroenteritis and colitis, unspecified: Secondary | ICD-10-CM | POA: Diagnosis not present

## 2019-01-30 DIAGNOSIS — R1310 Dysphagia, unspecified: Secondary | ICD-10-CM | POA: Diagnosis not present

## 2019-01-30 DIAGNOSIS — J690 Pneumonitis due to inhalation of food and vomit: Secondary | ICD-10-CM | POA: Diagnosis not present

## 2019-01-30 DIAGNOSIS — G309 Alzheimer's disease, unspecified: Secondary | ICD-10-CM | POA: Diagnosis not present

## 2019-01-30 DIAGNOSIS — F0281 Dementia in other diseases classified elsewhere with behavioral disturbance: Secondary | ICD-10-CM | POA: Diagnosis not present

## 2019-02-16 DIAGNOSIS — G309 Alzheimer's disease, unspecified: Secondary | ICD-10-CM | POA: Diagnosis not present

## 2019-02-16 DIAGNOSIS — A419 Sepsis, unspecified organism: Secondary | ICD-10-CM | POA: Diagnosis not present

## 2019-02-16 DIAGNOSIS — R1310 Dysphagia, unspecified: Secondary | ICD-10-CM | POA: Diagnosis not present

## 2019-02-16 DIAGNOSIS — J690 Pneumonitis due to inhalation of food and vomit: Secondary | ICD-10-CM | POA: Diagnosis not present

## 2019-02-16 DIAGNOSIS — F0281 Dementia in other diseases classified elsewhere with behavioral disturbance: Secondary | ICD-10-CM | POA: Diagnosis not present

## 2019-02-16 DIAGNOSIS — K529 Noninfective gastroenteritis and colitis, unspecified: Secondary | ICD-10-CM | POA: Diagnosis not present

## 2019-02-27 DIAGNOSIS — F0281 Dementia in other diseases classified elsewhere with behavioral disturbance: Secondary | ICD-10-CM | POA: Diagnosis not present

## 2019-02-27 DIAGNOSIS — J690 Pneumonitis due to inhalation of food and vomit: Secondary | ICD-10-CM | POA: Diagnosis not present

## 2019-02-27 DIAGNOSIS — Z7401 Bed confinement status: Secondary | ICD-10-CM | POA: Diagnosis not present

## 2019-02-27 DIAGNOSIS — G9340 Encephalopathy, unspecified: Secondary | ICD-10-CM | POA: Diagnosis not present

## 2019-02-27 DIAGNOSIS — A09 Infectious gastroenteritis and colitis, unspecified: Secondary | ICD-10-CM | POA: Diagnosis not present

## 2019-02-27 DIAGNOSIS — E119 Type 2 diabetes mellitus without complications: Secondary | ICD-10-CM | POA: Diagnosis not present

## 2019-02-27 DIAGNOSIS — R1310 Dysphagia, unspecified: Secondary | ICD-10-CM | POA: Diagnosis not present

## 2019-02-27 DIAGNOSIS — Z741 Need for assistance with personal care: Secondary | ICD-10-CM | POA: Diagnosis not present

## 2019-02-27 DIAGNOSIS — A419 Sepsis, unspecified organism: Secondary | ICD-10-CM | POA: Diagnosis not present

## 2019-02-27 DIAGNOSIS — E46 Unspecified protein-calorie malnutrition: Secondary | ICD-10-CM | POA: Diagnosis not present

## 2019-02-27 DIAGNOSIS — R32 Unspecified urinary incontinence: Secondary | ICD-10-CM | POA: Diagnosis not present

## 2019-02-27 DIAGNOSIS — I10 Essential (primary) hypertension: Secondary | ICD-10-CM | POA: Diagnosis not present

## 2019-02-27 DIAGNOSIS — G309 Alzheimer's disease, unspecified: Secondary | ICD-10-CM | POA: Diagnosis not present

## 2019-03-13 DIAGNOSIS — F0281 Dementia in other diseases classified elsewhere with behavioral disturbance: Secondary | ICD-10-CM | POA: Diagnosis not present

## 2019-03-13 DIAGNOSIS — G309 Alzheimer's disease, unspecified: Secondary | ICD-10-CM | POA: Diagnosis not present

## 2019-03-13 DIAGNOSIS — J690 Pneumonitis due to inhalation of food and vomit: Secondary | ICD-10-CM | POA: Diagnosis not present

## 2019-03-13 DIAGNOSIS — A419 Sepsis, unspecified organism: Secondary | ICD-10-CM | POA: Diagnosis not present

## 2019-03-13 DIAGNOSIS — R1310 Dysphagia, unspecified: Secondary | ICD-10-CM | POA: Diagnosis not present

## 2019-03-13 DIAGNOSIS — A09 Infectious gastroenteritis and colitis, unspecified: Secondary | ICD-10-CM | POA: Diagnosis not present

## 2019-03-15 DIAGNOSIS — A419 Sepsis, unspecified organism: Secondary | ICD-10-CM | POA: Diagnosis not present

## 2019-03-15 DIAGNOSIS — G309 Alzheimer's disease, unspecified: Secondary | ICD-10-CM | POA: Diagnosis not present

## 2019-03-15 DIAGNOSIS — F0281 Dementia in other diseases classified elsewhere with behavioral disturbance: Secondary | ICD-10-CM | POA: Diagnosis not present

## 2019-03-15 DIAGNOSIS — A09 Infectious gastroenteritis and colitis, unspecified: Secondary | ICD-10-CM | POA: Diagnosis not present

## 2019-03-15 DIAGNOSIS — J690 Pneumonitis due to inhalation of food and vomit: Secondary | ICD-10-CM | POA: Diagnosis not present

## 2019-03-15 DIAGNOSIS — R1310 Dysphagia, unspecified: Secondary | ICD-10-CM | POA: Diagnosis not present

## 2019-03-27 DIAGNOSIS — J690 Pneumonitis due to inhalation of food and vomit: Secondary | ICD-10-CM | POA: Diagnosis not present

## 2019-03-27 DIAGNOSIS — A419 Sepsis, unspecified organism: Secondary | ICD-10-CM | POA: Diagnosis not present

## 2019-03-27 DIAGNOSIS — G309 Alzheimer's disease, unspecified: Secondary | ICD-10-CM | POA: Diagnosis not present

## 2019-03-27 DIAGNOSIS — R1310 Dysphagia, unspecified: Secondary | ICD-10-CM | POA: Diagnosis not present

## 2019-03-27 DIAGNOSIS — F0281 Dementia in other diseases classified elsewhere with behavioral disturbance: Secondary | ICD-10-CM | POA: Diagnosis not present

## 2019-03-27 DIAGNOSIS — A09 Infectious gastroenteritis and colitis, unspecified: Secondary | ICD-10-CM | POA: Diagnosis not present

## 2019-03-29 DIAGNOSIS — G309 Alzheimer's disease, unspecified: Secondary | ICD-10-CM | POA: Diagnosis not present

## 2019-03-29 DIAGNOSIS — E119 Type 2 diabetes mellitus without complications: Secondary | ICD-10-CM | POA: Diagnosis not present

## 2019-03-29 DIAGNOSIS — Z7401 Bed confinement status: Secondary | ICD-10-CM | POA: Diagnosis not present

## 2019-03-29 DIAGNOSIS — Z741 Need for assistance with personal care: Secondary | ICD-10-CM | POA: Diagnosis not present

## 2019-03-29 DIAGNOSIS — A09 Infectious gastroenteritis and colitis, unspecified: Secondary | ICD-10-CM | POA: Diagnosis not present

## 2019-03-29 DIAGNOSIS — I10 Essential (primary) hypertension: Secondary | ICD-10-CM | POA: Diagnosis not present

## 2019-03-29 DIAGNOSIS — G9340 Encephalopathy, unspecified: Secondary | ICD-10-CM | POA: Diagnosis not present

## 2019-03-29 DIAGNOSIS — J690 Pneumonitis due to inhalation of food and vomit: Secondary | ICD-10-CM | POA: Diagnosis not present

## 2019-03-29 DIAGNOSIS — R1310 Dysphagia, unspecified: Secondary | ICD-10-CM | POA: Diagnosis not present

## 2019-03-29 DIAGNOSIS — E46 Unspecified protein-calorie malnutrition: Secondary | ICD-10-CM | POA: Diagnosis not present

## 2019-03-29 DIAGNOSIS — A419 Sepsis, unspecified organism: Secondary | ICD-10-CM | POA: Diagnosis not present

## 2019-03-29 DIAGNOSIS — F0281 Dementia in other diseases classified elsewhere with behavioral disturbance: Secondary | ICD-10-CM | POA: Diagnosis not present

## 2019-03-29 DIAGNOSIS — R32 Unspecified urinary incontinence: Secondary | ICD-10-CM | POA: Diagnosis not present

## 2019-04-03 DIAGNOSIS — R1310 Dysphagia, unspecified: Secondary | ICD-10-CM | POA: Diagnosis not present

## 2019-04-03 DIAGNOSIS — A419 Sepsis, unspecified organism: Secondary | ICD-10-CM | POA: Diagnosis not present

## 2019-04-03 DIAGNOSIS — F0281 Dementia in other diseases classified elsewhere with behavioral disturbance: Secondary | ICD-10-CM | POA: Diagnosis not present

## 2019-04-03 DIAGNOSIS — G309 Alzheimer's disease, unspecified: Secondary | ICD-10-CM | POA: Diagnosis not present

## 2019-04-03 DIAGNOSIS — A09 Infectious gastroenteritis and colitis, unspecified: Secondary | ICD-10-CM | POA: Diagnosis not present

## 2019-04-03 DIAGNOSIS — J690 Pneumonitis due to inhalation of food and vomit: Secondary | ICD-10-CM | POA: Diagnosis not present

## 2019-04-10 DIAGNOSIS — A09 Infectious gastroenteritis and colitis, unspecified: Secondary | ICD-10-CM | POA: Diagnosis not present

## 2019-04-10 DIAGNOSIS — J690 Pneumonitis due to inhalation of food and vomit: Secondary | ICD-10-CM | POA: Diagnosis not present

## 2019-04-10 DIAGNOSIS — F0281 Dementia in other diseases classified elsewhere with behavioral disturbance: Secondary | ICD-10-CM | POA: Diagnosis not present

## 2019-04-10 DIAGNOSIS — R1310 Dysphagia, unspecified: Secondary | ICD-10-CM | POA: Diagnosis not present

## 2019-04-10 DIAGNOSIS — A419 Sepsis, unspecified organism: Secondary | ICD-10-CM | POA: Diagnosis not present

## 2019-04-10 DIAGNOSIS — G309 Alzheimer's disease, unspecified: Secondary | ICD-10-CM | POA: Diagnosis not present

## 2019-04-17 DIAGNOSIS — F0281 Dementia in other diseases classified elsewhere with behavioral disturbance: Secondary | ICD-10-CM | POA: Diagnosis not present

## 2019-04-17 DIAGNOSIS — J690 Pneumonitis due to inhalation of food and vomit: Secondary | ICD-10-CM | POA: Diagnosis not present

## 2019-04-17 DIAGNOSIS — R1310 Dysphagia, unspecified: Secondary | ICD-10-CM | POA: Diagnosis not present

## 2019-04-17 DIAGNOSIS — A419 Sepsis, unspecified organism: Secondary | ICD-10-CM | POA: Diagnosis not present

## 2019-04-17 DIAGNOSIS — A09 Infectious gastroenteritis and colitis, unspecified: Secondary | ICD-10-CM | POA: Diagnosis not present

## 2019-04-17 DIAGNOSIS — G309 Alzheimer's disease, unspecified: Secondary | ICD-10-CM | POA: Diagnosis not present

## 2019-04-24 DIAGNOSIS — F0281 Dementia in other diseases classified elsewhere with behavioral disturbance: Secondary | ICD-10-CM | POA: Diagnosis not present

## 2019-04-24 DIAGNOSIS — J690 Pneumonitis due to inhalation of food and vomit: Secondary | ICD-10-CM | POA: Diagnosis not present

## 2019-04-24 DIAGNOSIS — A419 Sepsis, unspecified organism: Secondary | ICD-10-CM | POA: Diagnosis not present

## 2019-04-24 DIAGNOSIS — G309 Alzheimer's disease, unspecified: Secondary | ICD-10-CM | POA: Diagnosis not present

## 2019-04-24 DIAGNOSIS — A09 Infectious gastroenteritis and colitis, unspecified: Secondary | ICD-10-CM | POA: Diagnosis not present

## 2019-04-24 DIAGNOSIS — R1310 Dysphagia, unspecified: Secondary | ICD-10-CM | POA: Diagnosis not present

## 2019-04-29 DIAGNOSIS — M24561 Contracture, right knee: Secondary | ICD-10-CM | POA: Diagnosis not present

## 2019-04-29 DIAGNOSIS — S51811D Laceration without foreign body of right forearm, subsequent encounter: Secondary | ICD-10-CM | POA: Diagnosis not present

## 2019-04-29 DIAGNOSIS — E46 Unspecified protein-calorie malnutrition: Secondary | ICD-10-CM | POA: Diagnosis not present

## 2019-04-29 DIAGNOSIS — M24562 Contracture, left knee: Secondary | ICD-10-CM | POA: Diagnosis not present

## 2019-04-29 DIAGNOSIS — R64 Cachexia: Secondary | ICD-10-CM | POA: Diagnosis not present

## 2019-04-29 DIAGNOSIS — Z741 Need for assistance with personal care: Secondary | ICD-10-CM | POA: Diagnosis not present

## 2019-04-29 DIAGNOSIS — I10 Essential (primary) hypertension: Secondary | ICD-10-CM | POA: Diagnosis not present

## 2019-04-29 DIAGNOSIS — G9340 Encephalopathy, unspecified: Secondary | ICD-10-CM | POA: Diagnosis not present

## 2019-04-29 DIAGNOSIS — R32 Unspecified urinary incontinence: Secondary | ICD-10-CM | POA: Diagnosis not present

## 2019-04-29 DIAGNOSIS — A419 Sepsis, unspecified organism: Secondary | ICD-10-CM | POA: Diagnosis not present

## 2019-04-29 DIAGNOSIS — F0281 Dementia in other diseases classified elsewhere with behavioral disturbance: Secondary | ICD-10-CM | POA: Diagnosis not present

## 2019-04-29 DIAGNOSIS — E119 Type 2 diabetes mellitus without complications: Secondary | ICD-10-CM | POA: Diagnosis not present

## 2019-04-29 DIAGNOSIS — L89152 Pressure ulcer of sacral region, stage 2: Secondary | ICD-10-CM | POA: Diagnosis not present

## 2019-04-29 DIAGNOSIS — R1310 Dysphagia, unspecified: Secondary | ICD-10-CM | POA: Diagnosis not present

## 2019-04-29 DIAGNOSIS — J690 Pneumonitis due to inhalation of food and vomit: Secondary | ICD-10-CM | POA: Diagnosis not present

## 2019-04-29 DIAGNOSIS — Z7401 Bed confinement status: Secondary | ICD-10-CM | POA: Diagnosis not present

## 2019-04-29 DIAGNOSIS — G309 Alzheimer's disease, unspecified: Secondary | ICD-10-CM | POA: Diagnosis not present

## 2019-04-29 DIAGNOSIS — S51812D Laceration without foreign body of left forearm, subsequent encounter: Secondary | ICD-10-CM | POA: Diagnosis not present

## 2019-04-29 DIAGNOSIS — A09 Infectious gastroenteritis and colitis, unspecified: Secondary | ICD-10-CM | POA: Diagnosis not present

## 2019-05-01 DIAGNOSIS — F0281 Dementia in other diseases classified elsewhere with behavioral disturbance: Secondary | ICD-10-CM | POA: Diagnosis not present

## 2019-05-01 DIAGNOSIS — G309 Alzheimer's disease, unspecified: Secondary | ICD-10-CM | POA: Diagnosis not present

## 2019-05-01 DIAGNOSIS — J690 Pneumonitis due to inhalation of food and vomit: Secondary | ICD-10-CM | POA: Diagnosis not present

## 2019-05-01 DIAGNOSIS — A419 Sepsis, unspecified organism: Secondary | ICD-10-CM | POA: Diagnosis not present

## 2019-05-01 DIAGNOSIS — G9340 Encephalopathy, unspecified: Secondary | ICD-10-CM | POA: Diagnosis not present

## 2019-05-01 DIAGNOSIS — R1310 Dysphagia, unspecified: Secondary | ICD-10-CM | POA: Diagnosis not present

## 2019-05-03 DIAGNOSIS — A419 Sepsis, unspecified organism: Secondary | ICD-10-CM | POA: Diagnosis not present

## 2019-05-03 DIAGNOSIS — G9340 Encephalopathy, unspecified: Secondary | ICD-10-CM | POA: Diagnosis not present

## 2019-05-03 DIAGNOSIS — G309 Alzheimer's disease, unspecified: Secondary | ICD-10-CM | POA: Diagnosis not present

## 2019-05-03 DIAGNOSIS — J690 Pneumonitis due to inhalation of food and vomit: Secondary | ICD-10-CM | POA: Diagnosis not present

## 2019-05-03 DIAGNOSIS — R1310 Dysphagia, unspecified: Secondary | ICD-10-CM | POA: Diagnosis not present

## 2019-05-03 DIAGNOSIS — F0281 Dementia in other diseases classified elsewhere with behavioral disturbance: Secondary | ICD-10-CM | POA: Diagnosis not present

## 2019-05-08 DIAGNOSIS — J690 Pneumonitis due to inhalation of food and vomit: Secondary | ICD-10-CM | POA: Diagnosis not present

## 2019-05-08 DIAGNOSIS — F0281 Dementia in other diseases classified elsewhere with behavioral disturbance: Secondary | ICD-10-CM | POA: Diagnosis not present

## 2019-05-08 DIAGNOSIS — R1310 Dysphagia, unspecified: Secondary | ICD-10-CM | POA: Diagnosis not present

## 2019-05-08 DIAGNOSIS — A419 Sepsis, unspecified organism: Secondary | ICD-10-CM | POA: Diagnosis not present

## 2019-05-08 DIAGNOSIS — G9340 Encephalopathy, unspecified: Secondary | ICD-10-CM | POA: Diagnosis not present

## 2019-05-08 DIAGNOSIS — G309 Alzheimer's disease, unspecified: Secondary | ICD-10-CM | POA: Diagnosis not present

## 2019-05-09 DIAGNOSIS — J690 Pneumonitis due to inhalation of food and vomit: Secondary | ICD-10-CM | POA: Diagnosis not present

## 2019-05-09 DIAGNOSIS — R1310 Dysphagia, unspecified: Secondary | ICD-10-CM | POA: Diagnosis not present

## 2019-05-09 DIAGNOSIS — G309 Alzheimer's disease, unspecified: Secondary | ICD-10-CM | POA: Diagnosis not present

## 2019-05-09 DIAGNOSIS — F0281 Dementia in other diseases classified elsewhere with behavioral disturbance: Secondary | ICD-10-CM | POA: Diagnosis not present

## 2019-05-09 DIAGNOSIS — G9340 Encephalopathy, unspecified: Secondary | ICD-10-CM | POA: Diagnosis not present

## 2019-05-09 DIAGNOSIS — A419 Sepsis, unspecified organism: Secondary | ICD-10-CM | POA: Diagnosis not present

## 2019-05-18 DIAGNOSIS — R1310 Dysphagia, unspecified: Secondary | ICD-10-CM | POA: Diagnosis not present

## 2019-05-18 DIAGNOSIS — A419 Sepsis, unspecified organism: Secondary | ICD-10-CM | POA: Diagnosis not present

## 2019-05-18 DIAGNOSIS — G9340 Encephalopathy, unspecified: Secondary | ICD-10-CM | POA: Diagnosis not present

## 2019-05-18 DIAGNOSIS — F0281 Dementia in other diseases classified elsewhere with behavioral disturbance: Secondary | ICD-10-CM | POA: Diagnosis not present

## 2019-05-18 DIAGNOSIS — G309 Alzheimer's disease, unspecified: Secondary | ICD-10-CM | POA: Diagnosis not present

## 2019-05-18 DIAGNOSIS — J690 Pneumonitis due to inhalation of food and vomit: Secondary | ICD-10-CM | POA: Diagnosis not present

## 2019-05-22 DIAGNOSIS — J690 Pneumonitis due to inhalation of food and vomit: Secondary | ICD-10-CM | POA: Diagnosis not present

## 2019-05-22 DIAGNOSIS — G309 Alzheimer's disease, unspecified: Secondary | ICD-10-CM | POA: Diagnosis not present

## 2019-05-22 DIAGNOSIS — R1310 Dysphagia, unspecified: Secondary | ICD-10-CM | POA: Diagnosis not present

## 2019-05-22 DIAGNOSIS — A419 Sepsis, unspecified organism: Secondary | ICD-10-CM | POA: Diagnosis not present

## 2019-05-22 DIAGNOSIS — G9340 Encephalopathy, unspecified: Secondary | ICD-10-CM | POA: Diagnosis not present

## 2019-05-22 DIAGNOSIS — F0281 Dementia in other diseases classified elsewhere with behavioral disturbance: Secondary | ICD-10-CM | POA: Diagnosis not present

## 2019-05-29 DIAGNOSIS — A419 Sepsis, unspecified organism: Secondary | ICD-10-CM | POA: Diagnosis not present

## 2019-05-29 DIAGNOSIS — R1310 Dysphagia, unspecified: Secondary | ICD-10-CM | POA: Diagnosis not present

## 2019-05-29 DIAGNOSIS — J690 Pneumonitis due to inhalation of food and vomit: Secondary | ICD-10-CM | POA: Diagnosis not present

## 2019-05-29 DIAGNOSIS — G9340 Encephalopathy, unspecified: Secondary | ICD-10-CM | POA: Diagnosis not present

## 2019-05-29 DIAGNOSIS — G309 Alzheimer's disease, unspecified: Secondary | ICD-10-CM | POA: Diagnosis not present

## 2019-05-29 DIAGNOSIS — F0281 Dementia in other diseases classified elsewhere with behavioral disturbance: Secondary | ICD-10-CM | POA: Diagnosis not present

## 2019-05-30 DIAGNOSIS — S51812D Laceration without foreign body of left forearm, subsequent encounter: Secondary | ICD-10-CM | POA: Diagnosis not present

## 2019-05-30 DIAGNOSIS — L89152 Pressure ulcer of sacral region, stage 2: Secondary | ICD-10-CM | POA: Diagnosis not present

## 2019-05-30 DIAGNOSIS — S51811D Laceration without foreign body of right forearm, subsequent encounter: Secondary | ICD-10-CM | POA: Diagnosis not present

## 2019-05-30 DIAGNOSIS — A09 Infectious gastroenteritis and colitis, unspecified: Secondary | ICD-10-CM | POA: Diagnosis not present

## 2019-05-30 DIAGNOSIS — R64 Cachexia: Secondary | ICD-10-CM | POA: Diagnosis not present

## 2019-05-30 DIAGNOSIS — R1310 Dysphagia, unspecified: Secondary | ICD-10-CM | POA: Diagnosis not present

## 2019-05-30 DIAGNOSIS — E46 Unspecified protein-calorie malnutrition: Secondary | ICD-10-CM | POA: Diagnosis not present

## 2019-05-30 DIAGNOSIS — R32 Unspecified urinary incontinence: Secondary | ICD-10-CM | POA: Diagnosis not present

## 2019-05-30 DIAGNOSIS — E119 Type 2 diabetes mellitus without complications: Secondary | ICD-10-CM | POA: Diagnosis not present

## 2019-05-30 DIAGNOSIS — Z741 Need for assistance with personal care: Secondary | ICD-10-CM | POA: Diagnosis not present

## 2019-05-30 DIAGNOSIS — A419 Sepsis, unspecified organism: Secondary | ICD-10-CM | POA: Diagnosis not present

## 2019-05-30 DIAGNOSIS — Z7401 Bed confinement status: Secondary | ICD-10-CM | POA: Diagnosis not present

## 2019-05-30 DIAGNOSIS — G309 Alzheimer's disease, unspecified: Secondary | ICD-10-CM | POA: Diagnosis not present

## 2019-05-30 DIAGNOSIS — G9340 Encephalopathy, unspecified: Secondary | ICD-10-CM | POA: Diagnosis not present

## 2019-05-30 DIAGNOSIS — F0281 Dementia in other diseases classified elsewhere with behavioral disturbance: Secondary | ICD-10-CM | POA: Diagnosis not present

## 2019-05-30 DIAGNOSIS — I10 Essential (primary) hypertension: Secondary | ICD-10-CM | POA: Diagnosis not present

## 2019-05-30 DIAGNOSIS — M24562 Contracture, left knee: Secondary | ICD-10-CM | POA: Diagnosis not present

## 2019-05-30 DIAGNOSIS — J690 Pneumonitis due to inhalation of food and vomit: Secondary | ICD-10-CM | POA: Diagnosis not present

## 2019-05-30 DIAGNOSIS — M24561 Contracture, right knee: Secondary | ICD-10-CM | POA: Diagnosis not present

## 2019-06-09 DIAGNOSIS — G9340 Encephalopathy, unspecified: Secondary | ICD-10-CM | POA: Diagnosis not present

## 2019-06-09 DIAGNOSIS — R1310 Dysphagia, unspecified: Secondary | ICD-10-CM | POA: Diagnosis not present

## 2019-06-09 DIAGNOSIS — A419 Sepsis, unspecified organism: Secondary | ICD-10-CM | POA: Diagnosis not present

## 2019-06-09 DIAGNOSIS — J690 Pneumonitis due to inhalation of food and vomit: Secondary | ICD-10-CM | POA: Diagnosis not present

## 2019-06-09 DIAGNOSIS — F0281 Dementia in other diseases classified elsewhere with behavioral disturbance: Secondary | ICD-10-CM | POA: Diagnosis not present

## 2019-06-09 DIAGNOSIS — G309 Alzheimer's disease, unspecified: Secondary | ICD-10-CM | POA: Diagnosis not present

## 2019-06-12 DIAGNOSIS — A419 Sepsis, unspecified organism: Secondary | ICD-10-CM | POA: Diagnosis not present

## 2019-06-12 DIAGNOSIS — G309 Alzheimer's disease, unspecified: Secondary | ICD-10-CM | POA: Diagnosis not present

## 2019-06-12 DIAGNOSIS — F0281 Dementia in other diseases classified elsewhere with behavioral disturbance: Secondary | ICD-10-CM | POA: Diagnosis not present

## 2019-06-12 DIAGNOSIS — R1310 Dysphagia, unspecified: Secondary | ICD-10-CM | POA: Diagnosis not present

## 2019-06-12 DIAGNOSIS — J690 Pneumonitis due to inhalation of food and vomit: Secondary | ICD-10-CM | POA: Diagnosis not present

## 2019-06-12 DIAGNOSIS — G9340 Encephalopathy, unspecified: Secondary | ICD-10-CM | POA: Diagnosis not present

## 2019-06-13 DIAGNOSIS — R1310 Dysphagia, unspecified: Secondary | ICD-10-CM | POA: Diagnosis not present

## 2019-06-13 DIAGNOSIS — J690 Pneumonitis due to inhalation of food and vomit: Secondary | ICD-10-CM | POA: Diagnosis not present

## 2019-06-13 DIAGNOSIS — G309 Alzheimer's disease, unspecified: Secondary | ICD-10-CM | POA: Diagnosis not present

## 2019-06-13 DIAGNOSIS — A419 Sepsis, unspecified organism: Secondary | ICD-10-CM | POA: Diagnosis not present

## 2019-06-13 DIAGNOSIS — G9340 Encephalopathy, unspecified: Secondary | ICD-10-CM | POA: Diagnosis not present

## 2019-06-13 DIAGNOSIS — F0281 Dementia in other diseases classified elsewhere with behavioral disturbance: Secondary | ICD-10-CM | POA: Diagnosis not present

## 2019-06-19 DIAGNOSIS — R1310 Dysphagia, unspecified: Secondary | ICD-10-CM | POA: Diagnosis not present

## 2019-06-19 DIAGNOSIS — G9340 Encephalopathy, unspecified: Secondary | ICD-10-CM | POA: Diagnosis not present

## 2019-06-19 DIAGNOSIS — J690 Pneumonitis due to inhalation of food and vomit: Secondary | ICD-10-CM | POA: Diagnosis not present

## 2019-06-19 DIAGNOSIS — F0281 Dementia in other diseases classified elsewhere with behavioral disturbance: Secondary | ICD-10-CM | POA: Diagnosis not present

## 2019-06-19 DIAGNOSIS — G309 Alzheimer's disease, unspecified: Secondary | ICD-10-CM | POA: Diagnosis not present

## 2019-06-19 DIAGNOSIS — A419 Sepsis, unspecified organism: Secondary | ICD-10-CM | POA: Diagnosis not present

## 2019-06-22 DIAGNOSIS — R1310 Dysphagia, unspecified: Secondary | ICD-10-CM | POA: Diagnosis not present

## 2019-06-22 DIAGNOSIS — G309 Alzheimer's disease, unspecified: Secondary | ICD-10-CM | POA: Diagnosis not present

## 2019-06-22 DIAGNOSIS — A419 Sepsis, unspecified organism: Secondary | ICD-10-CM | POA: Diagnosis not present

## 2019-06-22 DIAGNOSIS — G9340 Encephalopathy, unspecified: Secondary | ICD-10-CM | POA: Diagnosis not present

## 2019-06-22 DIAGNOSIS — F0281 Dementia in other diseases classified elsewhere with behavioral disturbance: Secondary | ICD-10-CM | POA: Diagnosis not present

## 2019-06-22 DIAGNOSIS — J690 Pneumonitis due to inhalation of food and vomit: Secondary | ICD-10-CM | POA: Diagnosis not present

## 2019-06-26 DIAGNOSIS — F0281 Dementia in other diseases classified elsewhere with behavioral disturbance: Secondary | ICD-10-CM | POA: Diagnosis not present

## 2019-06-26 DIAGNOSIS — A419 Sepsis, unspecified organism: Secondary | ICD-10-CM | POA: Diagnosis not present

## 2019-06-26 DIAGNOSIS — J690 Pneumonitis due to inhalation of food and vomit: Secondary | ICD-10-CM | POA: Diagnosis not present

## 2019-06-26 DIAGNOSIS — G309 Alzheimer's disease, unspecified: Secondary | ICD-10-CM | POA: Diagnosis not present

## 2019-06-26 DIAGNOSIS — R1310 Dysphagia, unspecified: Secondary | ICD-10-CM | POA: Diagnosis not present

## 2019-06-26 DIAGNOSIS — G9340 Encephalopathy, unspecified: Secondary | ICD-10-CM | POA: Diagnosis not present

## 2019-06-29 DIAGNOSIS — R1310 Dysphagia, unspecified: Secondary | ICD-10-CM | POA: Diagnosis not present

## 2019-06-29 DIAGNOSIS — M24561 Contracture, right knee: Secondary | ICD-10-CM | POA: Diagnosis not present

## 2019-06-29 DIAGNOSIS — L89102 Pressure ulcer of unspecified part of back, stage 2: Secondary | ICD-10-CM | POA: Diagnosis not present

## 2019-06-29 DIAGNOSIS — J690 Pneumonitis due to inhalation of food and vomit: Secondary | ICD-10-CM | POA: Diagnosis not present

## 2019-06-29 DIAGNOSIS — M21379 Foot drop, unspecified foot: Secondary | ICD-10-CM | POA: Diagnosis not present

## 2019-06-29 DIAGNOSIS — M6284 Sarcopenia: Secondary | ICD-10-CM | POA: Diagnosis not present

## 2019-06-29 DIAGNOSIS — F0281 Dementia in other diseases classified elsewhere with behavioral disturbance: Secondary | ICD-10-CM | POA: Diagnosis not present

## 2019-06-29 DIAGNOSIS — R32 Unspecified urinary incontinence: Secondary | ICD-10-CM | POA: Diagnosis not present

## 2019-06-29 DIAGNOSIS — G9340 Encephalopathy, unspecified: Secondary | ICD-10-CM | POA: Diagnosis not present

## 2019-06-29 DIAGNOSIS — A419 Sepsis, unspecified organism: Secondary | ICD-10-CM | POA: Diagnosis not present

## 2019-06-29 DIAGNOSIS — E119 Type 2 diabetes mellitus without complications: Secondary | ICD-10-CM | POA: Diagnosis not present

## 2019-06-29 DIAGNOSIS — E46 Unspecified protein-calorie malnutrition: Secondary | ICD-10-CM | POA: Diagnosis not present

## 2019-06-29 DIAGNOSIS — A09 Infectious gastroenteritis and colitis, unspecified: Secondary | ICD-10-CM | POA: Diagnosis not present

## 2019-06-29 DIAGNOSIS — R64 Cachexia: Secondary | ICD-10-CM | POA: Diagnosis not present

## 2019-06-29 DIAGNOSIS — M24562 Contracture, left knee: Secondary | ICD-10-CM | POA: Diagnosis not present

## 2019-06-29 DIAGNOSIS — Z741 Need for assistance with personal care: Secondary | ICD-10-CM | POA: Diagnosis not present

## 2019-06-29 DIAGNOSIS — G309 Alzheimer's disease, unspecified: Secondary | ICD-10-CM | POA: Diagnosis not present

## 2019-06-29 DIAGNOSIS — I10 Essential (primary) hypertension: Secondary | ICD-10-CM | POA: Diagnosis not present

## 2019-06-29 DIAGNOSIS — Z7401 Bed confinement status: Secondary | ICD-10-CM | POA: Diagnosis not present

## 2019-07-03 DIAGNOSIS — A419 Sepsis, unspecified organism: Secondary | ICD-10-CM | POA: Diagnosis not present

## 2019-07-03 DIAGNOSIS — G9340 Encephalopathy, unspecified: Secondary | ICD-10-CM | POA: Diagnosis not present

## 2019-07-03 DIAGNOSIS — G309 Alzheimer's disease, unspecified: Secondary | ICD-10-CM | POA: Diagnosis not present

## 2019-07-03 DIAGNOSIS — J690 Pneumonitis due to inhalation of food and vomit: Secondary | ICD-10-CM | POA: Diagnosis not present

## 2019-07-03 DIAGNOSIS — F0281 Dementia in other diseases classified elsewhere with behavioral disturbance: Secondary | ICD-10-CM | POA: Diagnosis not present

## 2019-07-03 DIAGNOSIS — R1310 Dysphagia, unspecified: Secondary | ICD-10-CM | POA: Diagnosis not present

## 2019-07-05 DIAGNOSIS — J690 Pneumonitis due to inhalation of food and vomit: Secondary | ICD-10-CM | POA: Diagnosis not present

## 2019-07-05 DIAGNOSIS — R1310 Dysphagia, unspecified: Secondary | ICD-10-CM | POA: Diagnosis not present

## 2019-07-05 DIAGNOSIS — A419 Sepsis, unspecified organism: Secondary | ICD-10-CM | POA: Diagnosis not present

## 2019-07-05 DIAGNOSIS — G9340 Encephalopathy, unspecified: Secondary | ICD-10-CM | POA: Diagnosis not present

## 2019-07-05 DIAGNOSIS — G309 Alzheimer's disease, unspecified: Secondary | ICD-10-CM | POA: Diagnosis not present

## 2019-07-05 DIAGNOSIS — F0281 Dementia in other diseases classified elsewhere with behavioral disturbance: Secondary | ICD-10-CM | POA: Diagnosis not present

## 2019-07-10 DIAGNOSIS — G9340 Encephalopathy, unspecified: Secondary | ICD-10-CM | POA: Diagnosis not present

## 2019-07-10 DIAGNOSIS — F0281 Dementia in other diseases classified elsewhere with behavioral disturbance: Secondary | ICD-10-CM | POA: Diagnosis not present

## 2019-07-10 DIAGNOSIS — J690 Pneumonitis due to inhalation of food and vomit: Secondary | ICD-10-CM | POA: Diagnosis not present

## 2019-07-10 DIAGNOSIS — R1310 Dysphagia, unspecified: Secondary | ICD-10-CM | POA: Diagnosis not present

## 2019-07-10 DIAGNOSIS — A419 Sepsis, unspecified organism: Secondary | ICD-10-CM | POA: Diagnosis not present

## 2019-07-10 DIAGNOSIS — G309 Alzheimer's disease, unspecified: Secondary | ICD-10-CM | POA: Diagnosis not present

## 2019-07-12 DIAGNOSIS — J690 Pneumonitis due to inhalation of food and vomit: Secondary | ICD-10-CM | POA: Diagnosis not present

## 2019-07-12 DIAGNOSIS — R1310 Dysphagia, unspecified: Secondary | ICD-10-CM | POA: Diagnosis not present

## 2019-07-12 DIAGNOSIS — F0281 Dementia in other diseases classified elsewhere with behavioral disturbance: Secondary | ICD-10-CM | POA: Diagnosis not present

## 2019-07-12 DIAGNOSIS — G9340 Encephalopathy, unspecified: Secondary | ICD-10-CM | POA: Diagnosis not present

## 2019-07-12 DIAGNOSIS — G309 Alzheimer's disease, unspecified: Secondary | ICD-10-CM | POA: Diagnosis not present

## 2019-07-12 DIAGNOSIS — A419 Sepsis, unspecified organism: Secondary | ICD-10-CM | POA: Diagnosis not present

## 2019-07-13 DIAGNOSIS — A419 Sepsis, unspecified organism: Secondary | ICD-10-CM | POA: Diagnosis not present

## 2019-07-13 DIAGNOSIS — G309 Alzheimer's disease, unspecified: Secondary | ICD-10-CM | POA: Diagnosis not present

## 2019-07-13 DIAGNOSIS — F0281 Dementia in other diseases classified elsewhere with behavioral disturbance: Secondary | ICD-10-CM | POA: Diagnosis not present

## 2019-07-13 DIAGNOSIS — R1310 Dysphagia, unspecified: Secondary | ICD-10-CM | POA: Diagnosis not present

## 2019-07-13 DIAGNOSIS — G9340 Encephalopathy, unspecified: Secondary | ICD-10-CM | POA: Diagnosis not present

## 2019-07-13 DIAGNOSIS — J690 Pneumonitis due to inhalation of food and vomit: Secondary | ICD-10-CM | POA: Diagnosis not present

## 2019-07-17 DIAGNOSIS — G9340 Encephalopathy, unspecified: Secondary | ICD-10-CM | POA: Diagnosis not present

## 2019-07-17 DIAGNOSIS — G309 Alzheimer's disease, unspecified: Secondary | ICD-10-CM | POA: Diagnosis not present

## 2019-07-17 DIAGNOSIS — J690 Pneumonitis due to inhalation of food and vomit: Secondary | ICD-10-CM | POA: Diagnosis not present

## 2019-07-17 DIAGNOSIS — A419 Sepsis, unspecified organism: Secondary | ICD-10-CM | POA: Diagnosis not present

## 2019-07-17 DIAGNOSIS — F0281 Dementia in other diseases classified elsewhere with behavioral disturbance: Secondary | ICD-10-CM | POA: Diagnosis not present

## 2019-07-17 DIAGNOSIS — R1310 Dysphagia, unspecified: Secondary | ICD-10-CM | POA: Diagnosis not present

## 2019-07-24 DIAGNOSIS — R1310 Dysphagia, unspecified: Secondary | ICD-10-CM | POA: Diagnosis not present

## 2019-07-24 DIAGNOSIS — J690 Pneumonitis due to inhalation of food and vomit: Secondary | ICD-10-CM | POA: Diagnosis not present

## 2019-07-24 DIAGNOSIS — G309 Alzheimer's disease, unspecified: Secondary | ICD-10-CM | POA: Diagnosis not present

## 2019-07-24 DIAGNOSIS — A419 Sepsis, unspecified organism: Secondary | ICD-10-CM | POA: Diagnosis not present

## 2019-07-24 DIAGNOSIS — G9340 Encephalopathy, unspecified: Secondary | ICD-10-CM | POA: Diagnosis not present

## 2019-07-24 DIAGNOSIS — F0281 Dementia in other diseases classified elsewhere with behavioral disturbance: Secondary | ICD-10-CM | POA: Diagnosis not present

## 2019-07-30 DIAGNOSIS — M21379 Foot drop, unspecified foot: Secondary | ICD-10-CM | POA: Diagnosis not present

## 2019-07-30 DIAGNOSIS — Z741 Need for assistance with personal care: Secondary | ICD-10-CM | POA: Diagnosis not present

## 2019-07-30 DIAGNOSIS — J690 Pneumonitis due to inhalation of food and vomit: Secondary | ICD-10-CM | POA: Diagnosis not present

## 2019-07-30 DIAGNOSIS — M24562 Contracture, left knee: Secondary | ICD-10-CM | POA: Diagnosis not present

## 2019-07-30 DIAGNOSIS — R1310 Dysphagia, unspecified: Secondary | ICD-10-CM | POA: Diagnosis not present

## 2019-07-30 DIAGNOSIS — R32 Unspecified urinary incontinence: Secondary | ICD-10-CM | POA: Diagnosis not present

## 2019-07-30 DIAGNOSIS — R64 Cachexia: Secondary | ICD-10-CM | POA: Diagnosis not present

## 2019-07-30 DIAGNOSIS — A09 Infectious gastroenteritis and colitis, unspecified: Secondary | ICD-10-CM | POA: Diagnosis not present

## 2019-07-30 DIAGNOSIS — E46 Unspecified protein-calorie malnutrition: Secondary | ICD-10-CM | POA: Diagnosis not present

## 2019-07-30 DIAGNOSIS — F0281 Dementia in other diseases classified elsewhere with behavioral disturbance: Secondary | ICD-10-CM | POA: Diagnosis not present

## 2019-07-30 DIAGNOSIS — G309 Alzheimer's disease, unspecified: Secondary | ICD-10-CM | POA: Diagnosis not present

## 2019-07-30 DIAGNOSIS — E119 Type 2 diabetes mellitus without complications: Secondary | ICD-10-CM | POA: Diagnosis not present

## 2019-07-30 DIAGNOSIS — G9340 Encephalopathy, unspecified: Secondary | ICD-10-CM | POA: Diagnosis not present

## 2019-07-30 DIAGNOSIS — L89102 Pressure ulcer of unspecified part of back, stage 2: Secondary | ICD-10-CM | POA: Diagnosis not present

## 2019-07-30 DIAGNOSIS — I10 Essential (primary) hypertension: Secondary | ICD-10-CM | POA: Diagnosis not present

## 2019-07-30 DIAGNOSIS — Z7401 Bed confinement status: Secondary | ICD-10-CM | POA: Diagnosis not present

## 2019-07-30 DIAGNOSIS — M6284 Sarcopenia: Secondary | ICD-10-CM | POA: Diagnosis not present

## 2019-07-30 DIAGNOSIS — M24561 Contracture, right knee: Secondary | ICD-10-CM | POA: Diagnosis not present

## 2019-07-30 DIAGNOSIS — A419 Sepsis, unspecified organism: Secondary | ICD-10-CM | POA: Diagnosis not present

## 2019-07-31 DIAGNOSIS — G309 Alzheimer's disease, unspecified: Secondary | ICD-10-CM | POA: Diagnosis not present

## 2019-07-31 DIAGNOSIS — R1310 Dysphagia, unspecified: Secondary | ICD-10-CM | POA: Diagnosis not present

## 2019-07-31 DIAGNOSIS — A419 Sepsis, unspecified organism: Secondary | ICD-10-CM | POA: Diagnosis not present

## 2019-07-31 DIAGNOSIS — F0281 Dementia in other diseases classified elsewhere with behavioral disturbance: Secondary | ICD-10-CM | POA: Diagnosis not present

## 2019-07-31 DIAGNOSIS — G9340 Encephalopathy, unspecified: Secondary | ICD-10-CM | POA: Diagnosis not present

## 2019-07-31 DIAGNOSIS — J690 Pneumonitis due to inhalation of food and vomit: Secondary | ICD-10-CM | POA: Diagnosis not present

## 2019-08-01 DIAGNOSIS — A419 Sepsis, unspecified organism: Secondary | ICD-10-CM | POA: Diagnosis not present

## 2019-08-01 DIAGNOSIS — G309 Alzheimer's disease, unspecified: Secondary | ICD-10-CM | POA: Diagnosis not present

## 2019-08-01 DIAGNOSIS — G9340 Encephalopathy, unspecified: Secondary | ICD-10-CM | POA: Diagnosis not present

## 2019-08-01 DIAGNOSIS — J690 Pneumonitis due to inhalation of food and vomit: Secondary | ICD-10-CM | POA: Diagnosis not present

## 2019-08-01 DIAGNOSIS — R1310 Dysphagia, unspecified: Secondary | ICD-10-CM | POA: Diagnosis not present

## 2019-08-01 DIAGNOSIS — F0281 Dementia in other diseases classified elsewhere with behavioral disturbance: Secondary | ICD-10-CM | POA: Diagnosis not present

## 2019-08-11 DIAGNOSIS — J690 Pneumonitis due to inhalation of food and vomit: Secondary | ICD-10-CM | POA: Diagnosis not present

## 2019-08-11 DIAGNOSIS — G309 Alzheimer's disease, unspecified: Secondary | ICD-10-CM | POA: Diagnosis not present

## 2019-08-11 DIAGNOSIS — G9340 Encephalopathy, unspecified: Secondary | ICD-10-CM | POA: Diagnosis not present

## 2019-08-11 DIAGNOSIS — F0281 Dementia in other diseases classified elsewhere with behavioral disturbance: Secondary | ICD-10-CM | POA: Diagnosis not present

## 2019-08-11 DIAGNOSIS — A419 Sepsis, unspecified organism: Secondary | ICD-10-CM | POA: Diagnosis not present

## 2019-08-11 DIAGNOSIS — R1310 Dysphagia, unspecified: Secondary | ICD-10-CM | POA: Diagnosis not present

## 2019-08-14 DIAGNOSIS — G309 Alzheimer's disease, unspecified: Secondary | ICD-10-CM | POA: Diagnosis not present

## 2019-08-14 DIAGNOSIS — G9340 Encephalopathy, unspecified: Secondary | ICD-10-CM | POA: Diagnosis not present

## 2019-08-14 DIAGNOSIS — A419 Sepsis, unspecified organism: Secondary | ICD-10-CM | POA: Diagnosis not present

## 2019-08-14 DIAGNOSIS — J690 Pneumonitis due to inhalation of food and vomit: Secondary | ICD-10-CM | POA: Diagnosis not present

## 2019-08-14 DIAGNOSIS — R1310 Dysphagia, unspecified: Secondary | ICD-10-CM | POA: Diagnosis not present

## 2019-08-14 DIAGNOSIS — F0281 Dementia in other diseases classified elsewhere with behavioral disturbance: Secondary | ICD-10-CM | POA: Diagnosis not present

## 2019-08-16 DIAGNOSIS — G309 Alzheimer's disease, unspecified: Secondary | ICD-10-CM | POA: Diagnosis not present

## 2019-08-16 DIAGNOSIS — G9340 Encephalopathy, unspecified: Secondary | ICD-10-CM | POA: Diagnosis not present

## 2019-08-16 DIAGNOSIS — A419 Sepsis, unspecified organism: Secondary | ICD-10-CM | POA: Diagnosis not present

## 2019-08-16 DIAGNOSIS — R1310 Dysphagia, unspecified: Secondary | ICD-10-CM | POA: Diagnosis not present

## 2019-08-16 DIAGNOSIS — J690 Pneumonitis due to inhalation of food and vomit: Secondary | ICD-10-CM | POA: Diagnosis not present

## 2019-08-16 DIAGNOSIS — F0281 Dementia in other diseases classified elsewhere with behavioral disturbance: Secondary | ICD-10-CM | POA: Diagnosis not present

## 2019-08-21 DIAGNOSIS — G9340 Encephalopathy, unspecified: Secondary | ICD-10-CM | POA: Diagnosis not present

## 2019-08-21 DIAGNOSIS — F0281 Dementia in other diseases classified elsewhere with behavioral disturbance: Secondary | ICD-10-CM | POA: Diagnosis not present

## 2019-08-21 DIAGNOSIS — J690 Pneumonitis due to inhalation of food and vomit: Secondary | ICD-10-CM | POA: Diagnosis not present

## 2019-08-21 DIAGNOSIS — R1310 Dysphagia, unspecified: Secondary | ICD-10-CM | POA: Diagnosis not present

## 2019-08-21 DIAGNOSIS — G309 Alzheimer's disease, unspecified: Secondary | ICD-10-CM | POA: Diagnosis not present

## 2019-08-21 DIAGNOSIS — A419 Sepsis, unspecified organism: Secondary | ICD-10-CM | POA: Diagnosis not present

## 2019-08-28 DIAGNOSIS — G309 Alzheimer's disease, unspecified: Secondary | ICD-10-CM | POA: Diagnosis not present

## 2019-08-28 DIAGNOSIS — G9340 Encephalopathy, unspecified: Secondary | ICD-10-CM | POA: Diagnosis not present

## 2019-08-28 DIAGNOSIS — A419 Sepsis, unspecified organism: Secondary | ICD-10-CM | POA: Diagnosis not present

## 2019-08-28 DIAGNOSIS — R1310 Dysphagia, unspecified: Secondary | ICD-10-CM | POA: Diagnosis not present

## 2019-08-28 DIAGNOSIS — J690 Pneumonitis due to inhalation of food and vomit: Secondary | ICD-10-CM | POA: Diagnosis not present

## 2019-08-28 DIAGNOSIS — F0281 Dementia in other diseases classified elsewhere with behavioral disturbance: Secondary | ICD-10-CM | POA: Diagnosis not present

## 2019-09-29 DIAGNOSIS — R64 Cachexia: Secondary | ICD-10-CM | POA: Diagnosis not present

## 2019-09-29 DIAGNOSIS — M21379 Foot drop, unspecified foot: Secondary | ICD-10-CM | POA: Diagnosis not present

## 2019-09-29 DIAGNOSIS — R32 Unspecified urinary incontinence: Secondary | ICD-10-CM | POA: Diagnosis not present

## 2019-09-29 DIAGNOSIS — E46 Unspecified protein-calorie malnutrition: Secondary | ICD-10-CM | POA: Diagnosis not present

## 2019-09-29 DIAGNOSIS — L89102 Pressure ulcer of unspecified part of back, stage 2: Secondary | ICD-10-CM | POA: Diagnosis not present

## 2019-09-29 DIAGNOSIS — J690 Pneumonitis due to inhalation of food and vomit: Secondary | ICD-10-CM | POA: Diagnosis not present

## 2019-09-29 DIAGNOSIS — M24561 Contracture, right knee: Secondary | ICD-10-CM | POA: Diagnosis not present

## 2019-09-29 DIAGNOSIS — G309 Alzheimer's disease, unspecified: Secondary | ICD-10-CM | POA: Diagnosis not present

## 2019-09-29 DIAGNOSIS — F0281 Dementia in other diseases classified elsewhere with behavioral disturbance: Secondary | ICD-10-CM | POA: Diagnosis not present

## 2019-09-29 DIAGNOSIS — R1310 Dysphagia, unspecified: Secondary | ICD-10-CM | POA: Diagnosis not present

## 2019-09-29 DIAGNOSIS — M6284 Sarcopenia: Secondary | ICD-10-CM | POA: Diagnosis not present

## 2019-09-29 DIAGNOSIS — Z7401 Bed confinement status: Secondary | ICD-10-CM | POA: Diagnosis not present

## 2019-09-29 DIAGNOSIS — G9340 Encephalopathy, unspecified: Secondary | ICD-10-CM | POA: Diagnosis not present

## 2019-09-29 DIAGNOSIS — A09 Infectious gastroenteritis and colitis, unspecified: Secondary | ICD-10-CM | POA: Diagnosis not present

## 2019-09-29 DIAGNOSIS — M24562 Contracture, left knee: Secondary | ICD-10-CM | POA: Diagnosis not present

## 2019-09-29 DIAGNOSIS — E119 Type 2 diabetes mellitus without complications: Secondary | ICD-10-CM | POA: Diagnosis not present

## 2019-09-29 DIAGNOSIS — I10 Essential (primary) hypertension: Secondary | ICD-10-CM | POA: Diagnosis not present

## 2019-09-29 DIAGNOSIS — A419 Sepsis, unspecified organism: Secondary | ICD-10-CM | POA: Diagnosis not present

## 2019-09-29 DIAGNOSIS — Z741 Need for assistance with personal care: Secondary | ICD-10-CM | POA: Diagnosis not present

## 2019-10-02 DIAGNOSIS — F0281 Dementia in other diseases classified elsewhere with behavioral disturbance: Secondary | ICD-10-CM | POA: Diagnosis not present

## 2019-10-02 DIAGNOSIS — G309 Alzheimer's disease, unspecified: Secondary | ICD-10-CM | POA: Diagnosis not present

## 2019-10-02 DIAGNOSIS — J690 Pneumonitis due to inhalation of food and vomit: Secondary | ICD-10-CM | POA: Diagnosis not present

## 2019-10-02 DIAGNOSIS — A419 Sepsis, unspecified organism: Secondary | ICD-10-CM | POA: Diagnosis not present

## 2019-10-02 DIAGNOSIS — G9340 Encephalopathy, unspecified: Secondary | ICD-10-CM | POA: Diagnosis not present

## 2019-10-02 DIAGNOSIS — R1310 Dysphagia, unspecified: Secondary | ICD-10-CM | POA: Diagnosis not present

## 2019-10-04 DIAGNOSIS — G309 Alzheimer's disease, unspecified: Secondary | ICD-10-CM | POA: Diagnosis not present

## 2019-10-04 DIAGNOSIS — F0281 Dementia in other diseases classified elsewhere with behavioral disturbance: Secondary | ICD-10-CM | POA: Diagnosis not present

## 2019-10-04 DIAGNOSIS — J690 Pneumonitis due to inhalation of food and vomit: Secondary | ICD-10-CM | POA: Diagnosis not present

## 2019-10-04 DIAGNOSIS — R1310 Dysphagia, unspecified: Secondary | ICD-10-CM | POA: Diagnosis not present

## 2019-10-04 DIAGNOSIS — G9340 Encephalopathy, unspecified: Secondary | ICD-10-CM | POA: Diagnosis not present

## 2019-10-04 DIAGNOSIS — A419 Sepsis, unspecified organism: Secondary | ICD-10-CM | POA: Diagnosis not present

## 2019-10-05 DIAGNOSIS — G9340 Encephalopathy, unspecified: Secondary | ICD-10-CM | POA: Diagnosis not present

## 2019-10-05 DIAGNOSIS — J690 Pneumonitis due to inhalation of food and vomit: Secondary | ICD-10-CM | POA: Diagnosis not present

## 2019-10-05 DIAGNOSIS — F0281 Dementia in other diseases classified elsewhere with behavioral disturbance: Secondary | ICD-10-CM | POA: Diagnosis not present

## 2019-10-05 DIAGNOSIS — G309 Alzheimer's disease, unspecified: Secondary | ICD-10-CM | POA: Diagnosis not present

## 2019-10-05 DIAGNOSIS — A419 Sepsis, unspecified organism: Secondary | ICD-10-CM | POA: Diagnosis not present

## 2019-10-05 DIAGNOSIS — R1310 Dysphagia, unspecified: Secondary | ICD-10-CM | POA: Diagnosis not present

## 2019-10-06 DIAGNOSIS — G9340 Encephalopathy, unspecified: Secondary | ICD-10-CM | POA: Diagnosis not present

## 2019-10-06 DIAGNOSIS — G309 Alzheimer's disease, unspecified: Secondary | ICD-10-CM | POA: Diagnosis not present

## 2019-10-06 DIAGNOSIS — R1310 Dysphagia, unspecified: Secondary | ICD-10-CM | POA: Diagnosis not present

## 2019-10-06 DIAGNOSIS — A419 Sepsis, unspecified organism: Secondary | ICD-10-CM | POA: Diagnosis not present

## 2019-10-06 DIAGNOSIS — F0281 Dementia in other diseases classified elsewhere with behavioral disturbance: Secondary | ICD-10-CM | POA: Diagnosis not present

## 2019-10-06 DIAGNOSIS — J690 Pneumonitis due to inhalation of food and vomit: Secondary | ICD-10-CM | POA: Diagnosis not present

## 2019-10-09 DIAGNOSIS — J690 Pneumonitis due to inhalation of food and vomit: Secondary | ICD-10-CM | POA: Diagnosis not present

## 2019-10-09 DIAGNOSIS — F0281 Dementia in other diseases classified elsewhere with behavioral disturbance: Secondary | ICD-10-CM | POA: Diagnosis not present

## 2019-10-09 DIAGNOSIS — G9340 Encephalopathy, unspecified: Secondary | ICD-10-CM | POA: Diagnosis not present

## 2019-10-09 DIAGNOSIS — A419 Sepsis, unspecified organism: Secondary | ICD-10-CM | POA: Diagnosis not present

## 2019-10-09 DIAGNOSIS — R1310 Dysphagia, unspecified: Secondary | ICD-10-CM | POA: Diagnosis not present

## 2019-10-09 DIAGNOSIS — G309 Alzheimer's disease, unspecified: Secondary | ICD-10-CM | POA: Diagnosis not present

## 2019-10-19 DIAGNOSIS — G309 Alzheimer's disease, unspecified: Secondary | ICD-10-CM | POA: Diagnosis not present

## 2019-10-19 DIAGNOSIS — G9340 Encephalopathy, unspecified: Secondary | ICD-10-CM | POA: Diagnosis not present

## 2019-10-19 DIAGNOSIS — R1310 Dysphagia, unspecified: Secondary | ICD-10-CM | POA: Diagnosis not present

## 2019-10-19 DIAGNOSIS — F0281 Dementia in other diseases classified elsewhere with behavioral disturbance: Secondary | ICD-10-CM | POA: Diagnosis not present

## 2019-10-19 DIAGNOSIS — J690 Pneumonitis due to inhalation of food and vomit: Secondary | ICD-10-CM | POA: Diagnosis not present

## 2019-10-19 DIAGNOSIS — A419 Sepsis, unspecified organism: Secondary | ICD-10-CM | POA: Diagnosis not present

## 2019-10-23 DIAGNOSIS — F0281 Dementia in other diseases classified elsewhere with behavioral disturbance: Secondary | ICD-10-CM | POA: Diagnosis not present

## 2019-10-23 DIAGNOSIS — G9340 Encephalopathy, unspecified: Secondary | ICD-10-CM | POA: Diagnosis not present

## 2019-10-23 DIAGNOSIS — J690 Pneumonitis due to inhalation of food and vomit: Secondary | ICD-10-CM | POA: Diagnosis not present

## 2019-10-23 DIAGNOSIS — A419 Sepsis, unspecified organism: Secondary | ICD-10-CM | POA: Diagnosis not present

## 2019-10-23 DIAGNOSIS — G309 Alzheimer's disease, unspecified: Secondary | ICD-10-CM | POA: Diagnosis not present

## 2019-10-23 DIAGNOSIS — R1310 Dysphagia, unspecified: Secondary | ICD-10-CM | POA: Diagnosis not present

## 2019-10-30 DIAGNOSIS — E46 Unspecified protein-calorie malnutrition: Secondary | ICD-10-CM | POA: Diagnosis not present

## 2019-10-30 DIAGNOSIS — M21379 Foot drop, unspecified foot: Secondary | ICD-10-CM | POA: Diagnosis not present

## 2019-10-30 DIAGNOSIS — A09 Infectious gastroenteritis and colitis, unspecified: Secondary | ICD-10-CM | POA: Diagnosis not present

## 2019-10-30 DIAGNOSIS — I10 Essential (primary) hypertension: Secondary | ICD-10-CM | POA: Diagnosis not present

## 2019-10-30 DIAGNOSIS — Z7401 Bed confinement status: Secondary | ICD-10-CM | POA: Diagnosis not present

## 2019-10-30 DIAGNOSIS — M24562 Contracture, left knee: Secondary | ICD-10-CM | POA: Diagnosis not present

## 2019-10-30 DIAGNOSIS — G9340 Encephalopathy, unspecified: Secondary | ICD-10-CM | POA: Diagnosis not present

## 2019-10-30 DIAGNOSIS — A419 Sepsis, unspecified organism: Secondary | ICD-10-CM | POA: Diagnosis not present

## 2019-10-30 DIAGNOSIS — L89102 Pressure ulcer of unspecified part of back, stage 2: Secondary | ICD-10-CM | POA: Diagnosis not present

## 2019-10-30 DIAGNOSIS — R64 Cachexia: Secondary | ICD-10-CM | POA: Diagnosis not present

## 2019-10-30 DIAGNOSIS — R1310 Dysphagia, unspecified: Secondary | ICD-10-CM | POA: Diagnosis not present

## 2019-10-30 DIAGNOSIS — J690 Pneumonitis due to inhalation of food and vomit: Secondary | ICD-10-CM | POA: Diagnosis not present

## 2019-10-30 DIAGNOSIS — E119 Type 2 diabetes mellitus without complications: Secondary | ICD-10-CM | POA: Diagnosis not present

## 2019-10-30 DIAGNOSIS — M6284 Sarcopenia: Secondary | ICD-10-CM | POA: Diagnosis not present

## 2019-10-30 DIAGNOSIS — M24561 Contracture, right knee: Secondary | ICD-10-CM | POA: Diagnosis not present

## 2019-10-30 DIAGNOSIS — Z741 Need for assistance with personal care: Secondary | ICD-10-CM | POA: Diagnosis not present

## 2019-10-30 DIAGNOSIS — F0281 Dementia in other diseases classified elsewhere with behavioral disturbance: Secondary | ICD-10-CM | POA: Diagnosis not present

## 2019-10-30 DIAGNOSIS — G309 Alzheimer's disease, unspecified: Secondary | ICD-10-CM | POA: Diagnosis not present

## 2019-10-30 DIAGNOSIS — R32 Unspecified urinary incontinence: Secondary | ICD-10-CM | POA: Diagnosis not present

## 2019-11-06 DIAGNOSIS — G9340 Encephalopathy, unspecified: Secondary | ICD-10-CM | POA: Diagnosis not present

## 2019-11-06 DIAGNOSIS — A419 Sepsis, unspecified organism: Secondary | ICD-10-CM | POA: Diagnosis not present

## 2019-11-06 DIAGNOSIS — R1310 Dysphagia, unspecified: Secondary | ICD-10-CM | POA: Diagnosis not present

## 2019-11-06 DIAGNOSIS — F0281 Dementia in other diseases classified elsewhere with behavioral disturbance: Secondary | ICD-10-CM | POA: Diagnosis not present

## 2019-11-06 DIAGNOSIS — J690 Pneumonitis due to inhalation of food and vomit: Secondary | ICD-10-CM | POA: Diagnosis not present

## 2019-11-06 DIAGNOSIS — G309 Alzheimer's disease, unspecified: Secondary | ICD-10-CM | POA: Diagnosis not present

## 2019-11-08 DIAGNOSIS — G309 Alzheimer's disease, unspecified: Secondary | ICD-10-CM | POA: Diagnosis not present

## 2019-11-08 DIAGNOSIS — G9340 Encephalopathy, unspecified: Secondary | ICD-10-CM | POA: Diagnosis not present

## 2019-11-08 DIAGNOSIS — J690 Pneumonitis due to inhalation of food and vomit: Secondary | ICD-10-CM | POA: Diagnosis not present

## 2019-11-08 DIAGNOSIS — A419 Sepsis, unspecified organism: Secondary | ICD-10-CM | POA: Diagnosis not present

## 2019-11-08 DIAGNOSIS — F0281 Dementia in other diseases classified elsewhere with behavioral disturbance: Secondary | ICD-10-CM | POA: Diagnosis not present

## 2019-11-08 DIAGNOSIS — R1310 Dysphagia, unspecified: Secondary | ICD-10-CM | POA: Diagnosis not present

## 2019-11-09 DIAGNOSIS — G309 Alzheimer's disease, unspecified: Secondary | ICD-10-CM | POA: Diagnosis not present

## 2019-11-09 DIAGNOSIS — R1310 Dysphagia, unspecified: Secondary | ICD-10-CM | POA: Diagnosis not present

## 2019-11-09 DIAGNOSIS — G9340 Encephalopathy, unspecified: Secondary | ICD-10-CM | POA: Diagnosis not present

## 2019-11-09 DIAGNOSIS — J690 Pneumonitis due to inhalation of food and vomit: Secondary | ICD-10-CM | POA: Diagnosis not present

## 2019-11-09 DIAGNOSIS — F0281 Dementia in other diseases classified elsewhere with behavioral disturbance: Secondary | ICD-10-CM | POA: Diagnosis not present

## 2019-11-09 DIAGNOSIS — A419 Sepsis, unspecified organism: Secondary | ICD-10-CM | POA: Diagnosis not present

## 2019-11-13 DIAGNOSIS — J690 Pneumonitis due to inhalation of food and vomit: Secondary | ICD-10-CM | POA: Diagnosis not present

## 2019-11-13 DIAGNOSIS — R1310 Dysphagia, unspecified: Secondary | ICD-10-CM | POA: Diagnosis not present

## 2019-11-13 DIAGNOSIS — G9340 Encephalopathy, unspecified: Secondary | ICD-10-CM | POA: Diagnosis not present

## 2019-11-13 DIAGNOSIS — A419 Sepsis, unspecified organism: Secondary | ICD-10-CM | POA: Diagnosis not present

## 2019-11-13 DIAGNOSIS — F0281 Dementia in other diseases classified elsewhere with behavioral disturbance: Secondary | ICD-10-CM | POA: Diagnosis not present

## 2019-11-13 DIAGNOSIS — G309 Alzheimer's disease, unspecified: Secondary | ICD-10-CM | POA: Diagnosis not present

## 2019-11-15 DIAGNOSIS — A419 Sepsis, unspecified organism: Secondary | ICD-10-CM | POA: Diagnosis not present

## 2019-11-15 DIAGNOSIS — G309 Alzheimer's disease, unspecified: Secondary | ICD-10-CM | POA: Diagnosis not present

## 2019-11-15 DIAGNOSIS — G9340 Encephalopathy, unspecified: Secondary | ICD-10-CM | POA: Diagnosis not present

## 2019-11-15 DIAGNOSIS — J690 Pneumonitis due to inhalation of food and vomit: Secondary | ICD-10-CM | POA: Diagnosis not present

## 2019-11-15 DIAGNOSIS — R1310 Dysphagia, unspecified: Secondary | ICD-10-CM | POA: Diagnosis not present

## 2019-11-15 DIAGNOSIS — F0281 Dementia in other diseases classified elsewhere with behavioral disturbance: Secondary | ICD-10-CM | POA: Diagnosis not present

## 2019-11-20 DIAGNOSIS — A419 Sepsis, unspecified organism: Secondary | ICD-10-CM | POA: Diagnosis not present

## 2019-11-20 DIAGNOSIS — R1310 Dysphagia, unspecified: Secondary | ICD-10-CM | POA: Diagnosis not present

## 2019-11-20 DIAGNOSIS — G309 Alzheimer's disease, unspecified: Secondary | ICD-10-CM | POA: Diagnosis not present

## 2019-11-20 DIAGNOSIS — J690 Pneumonitis due to inhalation of food and vomit: Secondary | ICD-10-CM | POA: Diagnosis not present

## 2019-11-20 DIAGNOSIS — G9340 Encephalopathy, unspecified: Secondary | ICD-10-CM | POA: Diagnosis not present

## 2019-11-20 DIAGNOSIS — F0281 Dementia in other diseases classified elsewhere with behavioral disturbance: Secondary | ICD-10-CM | POA: Diagnosis not present

## 2019-11-27 DIAGNOSIS — Z7401 Bed confinement status: Secondary | ICD-10-CM | POA: Diagnosis not present

## 2019-11-27 DIAGNOSIS — Z741 Need for assistance with personal care: Secondary | ICD-10-CM | POA: Diagnosis not present

## 2019-11-27 DIAGNOSIS — I10 Essential (primary) hypertension: Secondary | ICD-10-CM | POA: Diagnosis not present

## 2019-11-27 DIAGNOSIS — E46 Unspecified protein-calorie malnutrition: Secondary | ICD-10-CM | POA: Diagnosis not present

## 2019-11-27 DIAGNOSIS — R32 Unspecified urinary incontinence: Secondary | ICD-10-CM | POA: Diagnosis not present

## 2019-11-27 DIAGNOSIS — A09 Infectious gastroenteritis and colitis, unspecified: Secondary | ICD-10-CM | POA: Diagnosis not present

## 2019-11-27 DIAGNOSIS — M21379 Foot drop, unspecified foot: Secondary | ICD-10-CM | POA: Diagnosis not present

## 2019-11-27 DIAGNOSIS — R1310 Dysphagia, unspecified: Secondary | ICD-10-CM | POA: Diagnosis not present

## 2019-11-27 DIAGNOSIS — G9340 Encephalopathy, unspecified: Secondary | ICD-10-CM | POA: Diagnosis not present

## 2019-11-27 DIAGNOSIS — L89102 Pressure ulcer of unspecified part of back, stage 2: Secondary | ICD-10-CM | POA: Diagnosis not present

## 2019-11-27 DIAGNOSIS — M6284 Sarcopenia: Secondary | ICD-10-CM | POA: Diagnosis not present

## 2019-11-27 DIAGNOSIS — F0281 Dementia in other diseases classified elsewhere with behavioral disturbance: Secondary | ICD-10-CM | POA: Diagnosis not present

## 2019-11-27 DIAGNOSIS — R64 Cachexia: Secondary | ICD-10-CM | POA: Diagnosis not present

## 2019-11-27 DIAGNOSIS — M24561 Contracture, right knee: Secondary | ICD-10-CM | POA: Diagnosis not present

## 2019-11-27 DIAGNOSIS — M24562 Contracture, left knee: Secondary | ICD-10-CM | POA: Diagnosis not present

## 2019-11-27 DIAGNOSIS — J690 Pneumonitis due to inhalation of food and vomit: Secondary | ICD-10-CM | POA: Diagnosis not present

## 2019-11-27 DIAGNOSIS — A419 Sepsis, unspecified organism: Secondary | ICD-10-CM | POA: Diagnosis not present

## 2019-11-27 DIAGNOSIS — E119 Type 2 diabetes mellitus without complications: Secondary | ICD-10-CM | POA: Diagnosis not present

## 2019-11-27 DIAGNOSIS — G309 Alzheimer's disease, unspecified: Secondary | ICD-10-CM | POA: Diagnosis not present

## 2019-12-04 DIAGNOSIS — A419 Sepsis, unspecified organism: Secondary | ICD-10-CM | POA: Diagnosis not present

## 2019-12-04 DIAGNOSIS — G9340 Encephalopathy, unspecified: Secondary | ICD-10-CM | POA: Diagnosis not present

## 2019-12-04 DIAGNOSIS — G309 Alzheimer's disease, unspecified: Secondary | ICD-10-CM | POA: Diagnosis not present

## 2019-12-04 DIAGNOSIS — F0281 Dementia in other diseases classified elsewhere with behavioral disturbance: Secondary | ICD-10-CM | POA: Diagnosis not present

## 2019-12-04 DIAGNOSIS — J690 Pneumonitis due to inhalation of food and vomit: Secondary | ICD-10-CM | POA: Diagnosis not present

## 2019-12-04 DIAGNOSIS — R1310 Dysphagia, unspecified: Secondary | ICD-10-CM | POA: Diagnosis not present

## 2019-12-07 DIAGNOSIS — J690 Pneumonitis due to inhalation of food and vomit: Secondary | ICD-10-CM | POA: Diagnosis not present

## 2019-12-07 DIAGNOSIS — A419 Sepsis, unspecified organism: Secondary | ICD-10-CM | POA: Diagnosis not present

## 2019-12-07 DIAGNOSIS — F0281 Dementia in other diseases classified elsewhere with behavioral disturbance: Secondary | ICD-10-CM | POA: Diagnosis not present

## 2019-12-07 DIAGNOSIS — R1310 Dysphagia, unspecified: Secondary | ICD-10-CM | POA: Diagnosis not present

## 2019-12-07 DIAGNOSIS — G9340 Encephalopathy, unspecified: Secondary | ICD-10-CM | POA: Diagnosis not present

## 2019-12-07 DIAGNOSIS — G309 Alzheimer's disease, unspecified: Secondary | ICD-10-CM | POA: Diagnosis not present

## 2019-12-11 DIAGNOSIS — G309 Alzheimer's disease, unspecified: Secondary | ICD-10-CM | POA: Diagnosis not present

## 2019-12-11 DIAGNOSIS — J690 Pneumonitis due to inhalation of food and vomit: Secondary | ICD-10-CM | POA: Diagnosis not present

## 2019-12-11 DIAGNOSIS — R1310 Dysphagia, unspecified: Secondary | ICD-10-CM | POA: Diagnosis not present

## 2019-12-11 DIAGNOSIS — A419 Sepsis, unspecified organism: Secondary | ICD-10-CM | POA: Diagnosis not present

## 2019-12-11 DIAGNOSIS — F0281 Dementia in other diseases classified elsewhere with behavioral disturbance: Secondary | ICD-10-CM | POA: Diagnosis not present

## 2019-12-11 DIAGNOSIS — G9340 Encephalopathy, unspecified: Secondary | ICD-10-CM | POA: Diagnosis not present

## 2019-12-18 DIAGNOSIS — G9340 Encephalopathy, unspecified: Secondary | ICD-10-CM | POA: Diagnosis not present

## 2019-12-18 DIAGNOSIS — F0281 Dementia in other diseases classified elsewhere with behavioral disturbance: Secondary | ICD-10-CM | POA: Diagnosis not present

## 2019-12-18 DIAGNOSIS — J690 Pneumonitis due to inhalation of food and vomit: Secondary | ICD-10-CM | POA: Diagnosis not present

## 2019-12-18 DIAGNOSIS — R1310 Dysphagia, unspecified: Secondary | ICD-10-CM | POA: Diagnosis not present

## 2019-12-18 DIAGNOSIS — G309 Alzheimer's disease, unspecified: Secondary | ICD-10-CM | POA: Diagnosis not present

## 2019-12-18 DIAGNOSIS — A419 Sepsis, unspecified organism: Secondary | ICD-10-CM | POA: Diagnosis not present

## 2019-12-25 DIAGNOSIS — J690 Pneumonitis due to inhalation of food and vomit: Secondary | ICD-10-CM | POA: Diagnosis not present

## 2019-12-25 DIAGNOSIS — F0281 Dementia in other diseases classified elsewhere with behavioral disturbance: Secondary | ICD-10-CM | POA: Diagnosis not present

## 2019-12-25 DIAGNOSIS — G9340 Encephalopathy, unspecified: Secondary | ICD-10-CM | POA: Diagnosis not present

## 2019-12-25 DIAGNOSIS — A419 Sepsis, unspecified organism: Secondary | ICD-10-CM | POA: Diagnosis not present

## 2019-12-25 DIAGNOSIS — G309 Alzheimer's disease, unspecified: Secondary | ICD-10-CM | POA: Diagnosis not present

## 2019-12-25 DIAGNOSIS — R1310 Dysphagia, unspecified: Secondary | ICD-10-CM | POA: Diagnosis not present

## 2019-12-26 DIAGNOSIS — G309 Alzheimer's disease, unspecified: Secondary | ICD-10-CM | POA: Diagnosis not present

## 2019-12-26 DIAGNOSIS — F0281 Dementia in other diseases classified elsewhere with behavioral disturbance: Secondary | ICD-10-CM | POA: Diagnosis not present

## 2019-12-26 DIAGNOSIS — A419 Sepsis, unspecified organism: Secondary | ICD-10-CM | POA: Diagnosis not present

## 2019-12-26 DIAGNOSIS — R1310 Dysphagia, unspecified: Secondary | ICD-10-CM | POA: Diagnosis not present

## 2019-12-26 DIAGNOSIS — G9340 Encephalopathy, unspecified: Secondary | ICD-10-CM | POA: Diagnosis not present

## 2019-12-26 DIAGNOSIS — J690 Pneumonitis due to inhalation of food and vomit: Secondary | ICD-10-CM | POA: Diagnosis not present

## 2019-12-28 DIAGNOSIS — A419 Sepsis, unspecified organism: Secondary | ICD-10-CM | POA: Diagnosis not present

## 2019-12-28 DIAGNOSIS — F0281 Dementia in other diseases classified elsewhere with behavioral disturbance: Secondary | ICD-10-CM | POA: Diagnosis not present

## 2019-12-28 DIAGNOSIS — R64 Cachexia: Secondary | ICD-10-CM | POA: Diagnosis not present

## 2019-12-28 DIAGNOSIS — J690 Pneumonitis due to inhalation of food and vomit: Secondary | ICD-10-CM | POA: Diagnosis not present

## 2019-12-28 DIAGNOSIS — A09 Infectious gastroenteritis and colitis, unspecified: Secondary | ICD-10-CM | POA: Diagnosis not present

## 2019-12-28 DIAGNOSIS — E119 Type 2 diabetes mellitus without complications: Secondary | ICD-10-CM | POA: Diagnosis not present

## 2019-12-28 DIAGNOSIS — M24542 Contracture, left hand: Secondary | ICD-10-CM | POA: Diagnosis not present

## 2019-12-28 DIAGNOSIS — R1310 Dysphagia, unspecified: Secondary | ICD-10-CM | POA: Diagnosis not present

## 2019-12-28 DIAGNOSIS — Z741 Need for assistance with personal care: Secondary | ICD-10-CM | POA: Diagnosis not present

## 2019-12-28 DIAGNOSIS — M24562 Contracture, left knee: Secondary | ICD-10-CM | POA: Diagnosis not present

## 2019-12-28 DIAGNOSIS — M21379 Foot drop, unspecified foot: Secondary | ICD-10-CM | POA: Diagnosis not present

## 2019-12-28 DIAGNOSIS — G309 Alzheimer's disease, unspecified: Secondary | ICD-10-CM | POA: Diagnosis not present

## 2019-12-28 DIAGNOSIS — R32 Unspecified urinary incontinence: Secondary | ICD-10-CM | POA: Diagnosis not present

## 2019-12-28 DIAGNOSIS — M24561 Contracture, right knee: Secondary | ICD-10-CM | POA: Diagnosis not present

## 2019-12-28 DIAGNOSIS — E46 Unspecified protein-calorie malnutrition: Secondary | ICD-10-CM | POA: Diagnosis not present

## 2019-12-28 DIAGNOSIS — L89102 Pressure ulcer of unspecified part of back, stage 2: Secondary | ICD-10-CM | POA: Diagnosis not present

## 2019-12-28 DIAGNOSIS — M6284 Sarcopenia: Secondary | ICD-10-CM | POA: Diagnosis not present

## 2019-12-28 DIAGNOSIS — G9340 Encephalopathy, unspecified: Secondary | ICD-10-CM | POA: Diagnosis not present

## 2019-12-28 DIAGNOSIS — Z7401 Bed confinement status: Secondary | ICD-10-CM | POA: Diagnosis not present

## 2019-12-28 DIAGNOSIS — I10 Essential (primary) hypertension: Secondary | ICD-10-CM | POA: Diagnosis not present

## 2020-01-01 DIAGNOSIS — A419 Sepsis, unspecified organism: Secondary | ICD-10-CM | POA: Diagnosis not present

## 2020-01-01 DIAGNOSIS — G309 Alzheimer's disease, unspecified: Secondary | ICD-10-CM | POA: Diagnosis not present

## 2020-01-01 DIAGNOSIS — G9340 Encephalopathy, unspecified: Secondary | ICD-10-CM | POA: Diagnosis not present

## 2020-01-01 DIAGNOSIS — F0281 Dementia in other diseases classified elsewhere with behavioral disturbance: Secondary | ICD-10-CM | POA: Diagnosis not present

## 2020-01-01 DIAGNOSIS — J690 Pneumonitis due to inhalation of food and vomit: Secondary | ICD-10-CM | POA: Diagnosis not present

## 2020-01-01 DIAGNOSIS — R1310 Dysphagia, unspecified: Secondary | ICD-10-CM | POA: Diagnosis not present

## 2020-01-03 DIAGNOSIS — A419 Sepsis, unspecified organism: Secondary | ICD-10-CM | POA: Diagnosis not present

## 2020-01-03 DIAGNOSIS — J690 Pneumonitis due to inhalation of food and vomit: Secondary | ICD-10-CM | POA: Diagnosis not present

## 2020-01-03 DIAGNOSIS — R1310 Dysphagia, unspecified: Secondary | ICD-10-CM | POA: Diagnosis not present

## 2020-01-03 DIAGNOSIS — F0281 Dementia in other diseases classified elsewhere with behavioral disturbance: Secondary | ICD-10-CM | POA: Diagnosis not present

## 2020-01-03 DIAGNOSIS — G9340 Encephalopathy, unspecified: Secondary | ICD-10-CM | POA: Diagnosis not present

## 2020-01-03 DIAGNOSIS — G309 Alzheimer's disease, unspecified: Secondary | ICD-10-CM | POA: Diagnosis not present

## 2020-01-04 DIAGNOSIS — R1310 Dysphagia, unspecified: Secondary | ICD-10-CM | POA: Diagnosis not present

## 2020-01-04 DIAGNOSIS — F0281 Dementia in other diseases classified elsewhere with behavioral disturbance: Secondary | ICD-10-CM | POA: Diagnosis not present

## 2020-01-04 DIAGNOSIS — G309 Alzheimer's disease, unspecified: Secondary | ICD-10-CM | POA: Diagnosis not present

## 2020-01-04 DIAGNOSIS — J690 Pneumonitis due to inhalation of food and vomit: Secondary | ICD-10-CM | POA: Diagnosis not present

## 2020-01-04 DIAGNOSIS — G9340 Encephalopathy, unspecified: Secondary | ICD-10-CM | POA: Diagnosis not present

## 2020-01-04 DIAGNOSIS — A419 Sepsis, unspecified organism: Secondary | ICD-10-CM | POA: Diagnosis not present

## 2020-01-08 DIAGNOSIS — F0281 Dementia in other diseases classified elsewhere with behavioral disturbance: Secondary | ICD-10-CM | POA: Diagnosis not present

## 2020-01-08 DIAGNOSIS — A419 Sepsis, unspecified organism: Secondary | ICD-10-CM | POA: Diagnosis not present

## 2020-01-08 DIAGNOSIS — J690 Pneumonitis due to inhalation of food and vomit: Secondary | ICD-10-CM | POA: Diagnosis not present

## 2020-01-08 DIAGNOSIS — G9340 Encephalopathy, unspecified: Secondary | ICD-10-CM | POA: Diagnosis not present

## 2020-01-08 DIAGNOSIS — G309 Alzheimer's disease, unspecified: Secondary | ICD-10-CM | POA: Diagnosis not present

## 2020-01-08 DIAGNOSIS — R1310 Dysphagia, unspecified: Secondary | ICD-10-CM | POA: Diagnosis not present

## 2020-01-15 DIAGNOSIS — J690 Pneumonitis due to inhalation of food and vomit: Secondary | ICD-10-CM | POA: Diagnosis not present

## 2020-01-15 DIAGNOSIS — F0281 Dementia in other diseases classified elsewhere with behavioral disturbance: Secondary | ICD-10-CM | POA: Diagnosis not present

## 2020-01-15 DIAGNOSIS — A419 Sepsis, unspecified organism: Secondary | ICD-10-CM | POA: Diagnosis not present

## 2020-01-15 DIAGNOSIS — R1310 Dysphagia, unspecified: Secondary | ICD-10-CM | POA: Diagnosis not present

## 2020-01-15 DIAGNOSIS — G9340 Encephalopathy, unspecified: Secondary | ICD-10-CM | POA: Diagnosis not present

## 2020-01-15 DIAGNOSIS — G309 Alzheimer's disease, unspecified: Secondary | ICD-10-CM | POA: Diagnosis not present

## 2020-01-22 DIAGNOSIS — J690 Pneumonitis due to inhalation of food and vomit: Secondary | ICD-10-CM | POA: Diagnosis not present

## 2020-01-22 DIAGNOSIS — F0281 Dementia in other diseases classified elsewhere with behavioral disturbance: Secondary | ICD-10-CM | POA: Diagnosis not present

## 2020-01-22 DIAGNOSIS — R1310 Dysphagia, unspecified: Secondary | ICD-10-CM | POA: Diagnosis not present

## 2020-01-22 DIAGNOSIS — A419 Sepsis, unspecified organism: Secondary | ICD-10-CM | POA: Diagnosis not present

## 2020-01-22 DIAGNOSIS — G309 Alzheimer's disease, unspecified: Secondary | ICD-10-CM | POA: Diagnosis not present

## 2020-01-22 DIAGNOSIS — G9340 Encephalopathy, unspecified: Secondary | ICD-10-CM | POA: Diagnosis not present

## 2020-01-27 DIAGNOSIS — G9340 Encephalopathy, unspecified: Secondary | ICD-10-CM | POA: Diagnosis not present

## 2020-01-27 DIAGNOSIS — A09 Infectious gastroenteritis and colitis, unspecified: Secondary | ICD-10-CM | POA: Diagnosis not present

## 2020-01-27 DIAGNOSIS — M21379 Foot drop, unspecified foot: Secondary | ICD-10-CM | POA: Diagnosis not present

## 2020-01-27 DIAGNOSIS — R32 Unspecified urinary incontinence: Secondary | ICD-10-CM | POA: Diagnosis not present

## 2020-01-27 DIAGNOSIS — A419 Sepsis, unspecified organism: Secondary | ICD-10-CM | POA: Diagnosis not present

## 2020-01-27 DIAGNOSIS — Z741 Need for assistance with personal care: Secondary | ICD-10-CM | POA: Diagnosis not present

## 2020-01-27 DIAGNOSIS — M24561 Contracture, right knee: Secondary | ICD-10-CM | POA: Diagnosis not present

## 2020-01-27 DIAGNOSIS — M24542 Contracture, left hand: Secondary | ICD-10-CM | POA: Diagnosis not present

## 2020-01-27 DIAGNOSIS — M24562 Contracture, left knee: Secondary | ICD-10-CM | POA: Diagnosis not present

## 2020-01-27 DIAGNOSIS — G309 Alzheimer's disease, unspecified: Secondary | ICD-10-CM | POA: Diagnosis not present

## 2020-01-27 DIAGNOSIS — Z7401 Bed confinement status: Secondary | ICD-10-CM | POA: Diagnosis not present

## 2020-01-27 DIAGNOSIS — J690 Pneumonitis due to inhalation of food and vomit: Secondary | ICD-10-CM | POA: Diagnosis not present

## 2020-01-27 DIAGNOSIS — F0281 Dementia in other diseases classified elsewhere with behavioral disturbance: Secondary | ICD-10-CM | POA: Diagnosis not present

## 2020-01-27 DIAGNOSIS — M6284 Sarcopenia: Secondary | ICD-10-CM | POA: Diagnosis not present

## 2020-01-27 DIAGNOSIS — E46 Unspecified protein-calorie malnutrition: Secondary | ICD-10-CM | POA: Diagnosis not present

## 2020-01-27 DIAGNOSIS — L89102 Pressure ulcer of unspecified part of back, stage 2: Secondary | ICD-10-CM | POA: Diagnosis not present

## 2020-01-27 DIAGNOSIS — R1310 Dysphagia, unspecified: Secondary | ICD-10-CM | POA: Diagnosis not present

## 2020-01-27 DIAGNOSIS — I10 Essential (primary) hypertension: Secondary | ICD-10-CM | POA: Diagnosis not present

## 2020-01-27 DIAGNOSIS — E119 Type 2 diabetes mellitus without complications: Secondary | ICD-10-CM | POA: Diagnosis not present

## 2020-01-27 DIAGNOSIS — R64 Cachexia: Secondary | ICD-10-CM | POA: Diagnosis not present

## 2020-01-29 DIAGNOSIS — G9340 Encephalopathy, unspecified: Secondary | ICD-10-CM | POA: Diagnosis not present

## 2020-01-29 DIAGNOSIS — G309 Alzheimer's disease, unspecified: Secondary | ICD-10-CM | POA: Diagnosis not present

## 2020-01-29 DIAGNOSIS — R1310 Dysphagia, unspecified: Secondary | ICD-10-CM | POA: Diagnosis not present

## 2020-01-29 DIAGNOSIS — A419 Sepsis, unspecified organism: Secondary | ICD-10-CM | POA: Diagnosis not present

## 2020-01-29 DIAGNOSIS — J690 Pneumonitis due to inhalation of food and vomit: Secondary | ICD-10-CM | POA: Diagnosis not present

## 2020-01-29 DIAGNOSIS — F0281 Dementia in other diseases classified elsewhere with behavioral disturbance: Secondary | ICD-10-CM | POA: Diagnosis not present

## 2020-02-01 DIAGNOSIS — G9340 Encephalopathy, unspecified: Secondary | ICD-10-CM | POA: Diagnosis not present

## 2020-02-01 DIAGNOSIS — J690 Pneumonitis due to inhalation of food and vomit: Secondary | ICD-10-CM | POA: Diagnosis not present

## 2020-02-01 DIAGNOSIS — G309 Alzheimer's disease, unspecified: Secondary | ICD-10-CM | POA: Diagnosis not present

## 2020-02-01 DIAGNOSIS — R1310 Dysphagia, unspecified: Secondary | ICD-10-CM | POA: Diagnosis not present

## 2020-02-01 DIAGNOSIS — F0281 Dementia in other diseases classified elsewhere with behavioral disturbance: Secondary | ICD-10-CM | POA: Diagnosis not present

## 2020-02-01 DIAGNOSIS — A419 Sepsis, unspecified organism: Secondary | ICD-10-CM | POA: Diagnosis not present

## 2020-02-05 DIAGNOSIS — R1310 Dysphagia, unspecified: Secondary | ICD-10-CM | POA: Diagnosis not present

## 2020-02-05 DIAGNOSIS — A419 Sepsis, unspecified organism: Secondary | ICD-10-CM | POA: Diagnosis not present

## 2020-02-05 DIAGNOSIS — G9340 Encephalopathy, unspecified: Secondary | ICD-10-CM | POA: Diagnosis not present

## 2020-02-05 DIAGNOSIS — F0281 Dementia in other diseases classified elsewhere with behavioral disturbance: Secondary | ICD-10-CM | POA: Diagnosis not present

## 2020-02-05 DIAGNOSIS — G309 Alzheimer's disease, unspecified: Secondary | ICD-10-CM | POA: Diagnosis not present

## 2020-02-05 DIAGNOSIS — J690 Pneumonitis due to inhalation of food and vomit: Secondary | ICD-10-CM | POA: Diagnosis not present

## 2020-02-12 DIAGNOSIS — G9340 Encephalopathy, unspecified: Secondary | ICD-10-CM | POA: Diagnosis not present

## 2020-02-12 DIAGNOSIS — J690 Pneumonitis due to inhalation of food and vomit: Secondary | ICD-10-CM | POA: Diagnosis not present

## 2020-02-12 DIAGNOSIS — G309 Alzheimer's disease, unspecified: Secondary | ICD-10-CM | POA: Diagnosis not present

## 2020-02-12 DIAGNOSIS — A419 Sepsis, unspecified organism: Secondary | ICD-10-CM | POA: Diagnosis not present

## 2020-02-12 DIAGNOSIS — R1310 Dysphagia, unspecified: Secondary | ICD-10-CM | POA: Diagnosis not present

## 2020-02-12 DIAGNOSIS — F0281 Dementia in other diseases classified elsewhere with behavioral disturbance: Secondary | ICD-10-CM | POA: Diagnosis not present

## 2020-02-19 DIAGNOSIS — A419 Sepsis, unspecified organism: Secondary | ICD-10-CM | POA: Diagnosis not present

## 2020-02-19 DIAGNOSIS — G9340 Encephalopathy, unspecified: Secondary | ICD-10-CM | POA: Diagnosis not present

## 2020-02-19 DIAGNOSIS — G309 Alzheimer's disease, unspecified: Secondary | ICD-10-CM | POA: Diagnosis not present

## 2020-02-19 DIAGNOSIS — R1310 Dysphagia, unspecified: Secondary | ICD-10-CM | POA: Diagnosis not present

## 2020-02-19 DIAGNOSIS — F0281 Dementia in other diseases classified elsewhere with behavioral disturbance: Secondary | ICD-10-CM | POA: Diagnosis not present

## 2020-02-19 DIAGNOSIS — J690 Pneumonitis due to inhalation of food and vomit: Secondary | ICD-10-CM | POA: Diagnosis not present

## 2020-02-27 DIAGNOSIS — M24561 Contracture, right knee: Secondary | ICD-10-CM | POA: Diagnosis not present

## 2020-02-27 DIAGNOSIS — R64 Cachexia: Secondary | ICD-10-CM | POA: Diagnosis not present

## 2020-02-27 DIAGNOSIS — L89152 Pressure ulcer of sacral region, stage 2: Secondary | ICD-10-CM | POA: Diagnosis not present

## 2020-02-27 DIAGNOSIS — E46 Unspecified protein-calorie malnutrition: Secondary | ICD-10-CM | POA: Diagnosis not present

## 2020-02-27 DIAGNOSIS — R1310 Dysphagia, unspecified: Secondary | ICD-10-CM | POA: Diagnosis not present

## 2020-02-27 DIAGNOSIS — G9340 Encephalopathy, unspecified: Secondary | ICD-10-CM | POA: Diagnosis not present

## 2020-02-27 DIAGNOSIS — A419 Sepsis, unspecified organism: Secondary | ICD-10-CM | POA: Diagnosis not present

## 2020-02-27 DIAGNOSIS — E119 Type 2 diabetes mellitus without complications: Secondary | ICD-10-CM | POA: Diagnosis not present

## 2020-02-27 DIAGNOSIS — Z741 Need for assistance with personal care: Secondary | ICD-10-CM | POA: Diagnosis not present

## 2020-02-27 DIAGNOSIS — M6284 Sarcopenia: Secondary | ICD-10-CM | POA: Diagnosis not present

## 2020-02-27 DIAGNOSIS — G309 Alzheimer's disease, unspecified: Secondary | ICD-10-CM | POA: Diagnosis not present

## 2020-02-27 DIAGNOSIS — M24542 Contracture, left hand: Secondary | ICD-10-CM | POA: Diagnosis not present

## 2020-02-27 DIAGNOSIS — Z7401 Bed confinement status: Secondary | ICD-10-CM | POA: Diagnosis not present

## 2020-02-27 DIAGNOSIS — M24562 Contracture, left knee: Secondary | ICD-10-CM | POA: Diagnosis not present

## 2020-02-27 DIAGNOSIS — M21379 Foot drop, unspecified foot: Secondary | ICD-10-CM | POA: Diagnosis not present

## 2020-02-27 DIAGNOSIS — I10 Essential (primary) hypertension: Secondary | ICD-10-CM | POA: Diagnosis not present

## 2020-02-27 DIAGNOSIS — F0281 Dementia in other diseases classified elsewhere with behavioral disturbance: Secondary | ICD-10-CM | POA: Diagnosis not present

## 2020-02-27 DIAGNOSIS — R32 Unspecified urinary incontinence: Secondary | ICD-10-CM | POA: Diagnosis not present

## 2020-02-27 DIAGNOSIS — J690 Pneumonitis due to inhalation of food and vomit: Secondary | ICD-10-CM | POA: Diagnosis not present

## 2020-02-27 DIAGNOSIS — A09 Infectious gastroenteritis and colitis, unspecified: Secondary | ICD-10-CM | POA: Diagnosis not present

## 2020-02-28 DIAGNOSIS — J690 Pneumonitis due to inhalation of food and vomit: Secondary | ICD-10-CM | POA: Diagnosis not present

## 2020-02-28 DIAGNOSIS — F0281 Dementia in other diseases classified elsewhere with behavioral disturbance: Secondary | ICD-10-CM | POA: Diagnosis not present

## 2020-02-28 DIAGNOSIS — A419 Sepsis, unspecified organism: Secondary | ICD-10-CM | POA: Diagnosis not present

## 2020-02-28 DIAGNOSIS — G309 Alzheimer's disease, unspecified: Secondary | ICD-10-CM | POA: Diagnosis not present

## 2020-02-28 DIAGNOSIS — G9340 Encephalopathy, unspecified: Secondary | ICD-10-CM | POA: Diagnosis not present

## 2020-02-28 DIAGNOSIS — R1310 Dysphagia, unspecified: Secondary | ICD-10-CM | POA: Diagnosis not present

## 2020-03-04 DIAGNOSIS — G309 Alzheimer's disease, unspecified: Secondary | ICD-10-CM | POA: Diagnosis not present

## 2020-03-04 DIAGNOSIS — F0281 Dementia in other diseases classified elsewhere with behavioral disturbance: Secondary | ICD-10-CM | POA: Diagnosis not present

## 2020-03-04 DIAGNOSIS — J690 Pneumonitis due to inhalation of food and vomit: Secondary | ICD-10-CM | POA: Diagnosis not present

## 2020-03-04 DIAGNOSIS — A419 Sepsis, unspecified organism: Secondary | ICD-10-CM | POA: Diagnosis not present

## 2020-03-04 DIAGNOSIS — G9340 Encephalopathy, unspecified: Secondary | ICD-10-CM | POA: Diagnosis not present

## 2020-03-04 DIAGNOSIS — R1310 Dysphagia, unspecified: Secondary | ICD-10-CM | POA: Diagnosis not present

## 2020-03-07 DIAGNOSIS — F0281 Dementia in other diseases classified elsewhere with behavioral disturbance: Secondary | ICD-10-CM | POA: Diagnosis not present

## 2020-03-07 DIAGNOSIS — R1310 Dysphagia, unspecified: Secondary | ICD-10-CM | POA: Diagnosis not present

## 2020-03-07 DIAGNOSIS — J690 Pneumonitis due to inhalation of food and vomit: Secondary | ICD-10-CM | POA: Diagnosis not present

## 2020-03-07 DIAGNOSIS — A419 Sepsis, unspecified organism: Secondary | ICD-10-CM | POA: Diagnosis not present

## 2020-03-07 DIAGNOSIS — G309 Alzheimer's disease, unspecified: Secondary | ICD-10-CM | POA: Diagnosis not present

## 2020-03-07 DIAGNOSIS — G9340 Encephalopathy, unspecified: Secondary | ICD-10-CM | POA: Diagnosis not present

## 2020-03-11 DIAGNOSIS — F0281 Dementia in other diseases classified elsewhere with behavioral disturbance: Secondary | ICD-10-CM | POA: Diagnosis not present

## 2020-03-11 DIAGNOSIS — G9340 Encephalopathy, unspecified: Secondary | ICD-10-CM | POA: Diagnosis not present

## 2020-03-11 DIAGNOSIS — A419 Sepsis, unspecified organism: Secondary | ICD-10-CM | POA: Diagnosis not present

## 2020-03-11 DIAGNOSIS — R1310 Dysphagia, unspecified: Secondary | ICD-10-CM | POA: Diagnosis not present

## 2020-03-11 DIAGNOSIS — G309 Alzheimer's disease, unspecified: Secondary | ICD-10-CM | POA: Diagnosis not present

## 2020-03-11 DIAGNOSIS — J690 Pneumonitis due to inhalation of food and vomit: Secondary | ICD-10-CM | POA: Diagnosis not present

## 2020-03-18 DIAGNOSIS — A419 Sepsis, unspecified organism: Secondary | ICD-10-CM | POA: Diagnosis not present

## 2020-03-18 DIAGNOSIS — J690 Pneumonitis due to inhalation of food and vomit: Secondary | ICD-10-CM | POA: Diagnosis not present

## 2020-03-18 DIAGNOSIS — G9340 Encephalopathy, unspecified: Secondary | ICD-10-CM | POA: Diagnosis not present

## 2020-03-18 DIAGNOSIS — F0281 Dementia in other diseases classified elsewhere with behavioral disturbance: Secondary | ICD-10-CM | POA: Diagnosis not present

## 2020-03-18 DIAGNOSIS — G309 Alzheimer's disease, unspecified: Secondary | ICD-10-CM | POA: Diagnosis not present

## 2020-03-18 DIAGNOSIS — R1310 Dysphagia, unspecified: Secondary | ICD-10-CM | POA: Diagnosis not present

## 2020-03-28 DIAGNOSIS — Z7401 Bed confinement status: Secondary | ICD-10-CM | POA: Diagnosis not present

## 2020-03-28 DIAGNOSIS — G9340 Encephalopathy, unspecified: Secondary | ICD-10-CM | POA: Diagnosis not present

## 2020-03-28 DIAGNOSIS — L89152 Pressure ulcer of sacral region, stage 2: Secondary | ICD-10-CM | POA: Diagnosis not present

## 2020-03-28 DIAGNOSIS — G309 Alzheimer's disease, unspecified: Secondary | ICD-10-CM | POA: Diagnosis not present

## 2020-03-28 DIAGNOSIS — I10 Essential (primary) hypertension: Secondary | ICD-10-CM | POA: Diagnosis not present

## 2020-03-28 DIAGNOSIS — M24561 Contracture, right knee: Secondary | ICD-10-CM | POA: Diagnosis not present

## 2020-03-28 DIAGNOSIS — A419 Sepsis, unspecified organism: Secondary | ICD-10-CM | POA: Diagnosis not present

## 2020-03-28 DIAGNOSIS — R64 Cachexia: Secondary | ICD-10-CM | POA: Diagnosis not present

## 2020-03-28 DIAGNOSIS — M24562 Contracture, left knee: Secondary | ICD-10-CM | POA: Diagnosis not present

## 2020-03-28 DIAGNOSIS — E46 Unspecified protein-calorie malnutrition: Secondary | ICD-10-CM | POA: Diagnosis not present

## 2020-03-28 DIAGNOSIS — R32 Unspecified urinary incontinence: Secondary | ICD-10-CM | POA: Diagnosis not present

## 2020-03-28 DIAGNOSIS — A09 Infectious gastroenteritis and colitis, unspecified: Secondary | ICD-10-CM | POA: Diagnosis not present

## 2020-03-28 DIAGNOSIS — M24542 Contracture, left hand: Secondary | ICD-10-CM | POA: Diagnosis not present

## 2020-03-28 DIAGNOSIS — E119 Type 2 diabetes mellitus without complications: Secondary | ICD-10-CM | POA: Diagnosis not present

## 2020-03-28 DIAGNOSIS — M6284 Sarcopenia: Secondary | ICD-10-CM | POA: Diagnosis not present

## 2020-03-28 DIAGNOSIS — J690 Pneumonitis due to inhalation of food and vomit: Secondary | ICD-10-CM | POA: Diagnosis not present

## 2020-03-28 DIAGNOSIS — F0281 Dementia in other diseases classified elsewhere with behavioral disturbance: Secondary | ICD-10-CM | POA: Diagnosis not present

## 2020-03-28 DIAGNOSIS — M21379 Foot drop, unspecified foot: Secondary | ICD-10-CM | POA: Diagnosis not present

## 2020-03-28 DIAGNOSIS — Z741 Need for assistance with personal care: Secondary | ICD-10-CM | POA: Diagnosis not present

## 2020-03-28 DIAGNOSIS — R1310 Dysphagia, unspecified: Secondary | ICD-10-CM | POA: Diagnosis not present

## 2020-04-01 DIAGNOSIS — J690 Pneumonitis due to inhalation of food and vomit: Secondary | ICD-10-CM | POA: Diagnosis not present

## 2020-04-01 DIAGNOSIS — G309 Alzheimer's disease, unspecified: Secondary | ICD-10-CM | POA: Diagnosis not present

## 2020-04-01 DIAGNOSIS — G9340 Encephalopathy, unspecified: Secondary | ICD-10-CM | POA: Diagnosis not present

## 2020-04-01 DIAGNOSIS — A419 Sepsis, unspecified organism: Secondary | ICD-10-CM | POA: Diagnosis not present

## 2020-04-01 DIAGNOSIS — F0281 Dementia in other diseases classified elsewhere with behavioral disturbance: Secondary | ICD-10-CM | POA: Diagnosis not present

## 2020-04-01 DIAGNOSIS — R1310 Dysphagia, unspecified: Secondary | ICD-10-CM | POA: Diagnosis not present

## 2020-04-10 DIAGNOSIS — F0281 Dementia in other diseases classified elsewhere with behavioral disturbance: Secondary | ICD-10-CM | POA: Diagnosis not present

## 2020-04-10 DIAGNOSIS — A419 Sepsis, unspecified organism: Secondary | ICD-10-CM | POA: Diagnosis not present

## 2020-04-10 DIAGNOSIS — G309 Alzheimer's disease, unspecified: Secondary | ICD-10-CM | POA: Diagnosis not present

## 2020-04-10 DIAGNOSIS — R1310 Dysphagia, unspecified: Secondary | ICD-10-CM | POA: Diagnosis not present

## 2020-04-10 DIAGNOSIS — G9340 Encephalopathy, unspecified: Secondary | ICD-10-CM | POA: Diagnosis not present

## 2020-04-10 DIAGNOSIS — J690 Pneumonitis due to inhalation of food and vomit: Secondary | ICD-10-CM | POA: Diagnosis not present

## 2020-04-15 DIAGNOSIS — G9340 Encephalopathy, unspecified: Secondary | ICD-10-CM | POA: Diagnosis not present

## 2020-04-15 DIAGNOSIS — F0281 Dementia in other diseases classified elsewhere with behavioral disturbance: Secondary | ICD-10-CM | POA: Diagnosis not present

## 2020-04-15 DIAGNOSIS — R1310 Dysphagia, unspecified: Secondary | ICD-10-CM | POA: Diagnosis not present

## 2020-04-15 DIAGNOSIS — A419 Sepsis, unspecified organism: Secondary | ICD-10-CM | POA: Diagnosis not present

## 2020-04-15 DIAGNOSIS — J690 Pneumonitis due to inhalation of food and vomit: Secondary | ICD-10-CM | POA: Diagnosis not present

## 2020-04-15 DIAGNOSIS — G309 Alzheimer's disease, unspecified: Secondary | ICD-10-CM | POA: Diagnosis not present

## 2020-04-22 DIAGNOSIS — G9340 Encephalopathy, unspecified: Secondary | ICD-10-CM | POA: Diagnosis not present

## 2020-04-22 DIAGNOSIS — G309 Alzheimer's disease, unspecified: Secondary | ICD-10-CM | POA: Diagnosis not present

## 2020-04-22 DIAGNOSIS — J690 Pneumonitis due to inhalation of food and vomit: Secondary | ICD-10-CM | POA: Diagnosis not present

## 2020-04-22 DIAGNOSIS — F0281 Dementia in other diseases classified elsewhere with behavioral disturbance: Secondary | ICD-10-CM | POA: Diagnosis not present

## 2020-04-22 DIAGNOSIS — R1310 Dysphagia, unspecified: Secondary | ICD-10-CM | POA: Diagnosis not present

## 2020-04-22 DIAGNOSIS — A419 Sepsis, unspecified organism: Secondary | ICD-10-CM | POA: Diagnosis not present

## 2020-04-24 DIAGNOSIS — G9340 Encephalopathy, unspecified: Secondary | ICD-10-CM | POA: Diagnosis not present

## 2020-04-24 DIAGNOSIS — F0281 Dementia in other diseases classified elsewhere with behavioral disturbance: Secondary | ICD-10-CM | POA: Diagnosis not present

## 2020-04-24 DIAGNOSIS — G309 Alzheimer's disease, unspecified: Secondary | ICD-10-CM | POA: Diagnosis not present

## 2020-04-24 DIAGNOSIS — A419 Sepsis, unspecified organism: Secondary | ICD-10-CM | POA: Diagnosis not present

## 2020-04-24 DIAGNOSIS — J690 Pneumonitis due to inhalation of food and vomit: Secondary | ICD-10-CM | POA: Diagnosis not present

## 2020-04-24 DIAGNOSIS — R1310 Dysphagia, unspecified: Secondary | ICD-10-CM | POA: Diagnosis not present

## 2020-04-28 DIAGNOSIS — M24562 Contracture, left knee: Secondary | ICD-10-CM | POA: Diagnosis not present

## 2020-04-28 DIAGNOSIS — L89152 Pressure ulcer of sacral region, stage 2: Secondary | ICD-10-CM | POA: Diagnosis not present

## 2020-04-28 DIAGNOSIS — E119 Type 2 diabetes mellitus without complications: Secondary | ICD-10-CM | POA: Diagnosis not present

## 2020-04-28 DIAGNOSIS — M24542 Contracture, left hand: Secondary | ICD-10-CM | POA: Diagnosis not present

## 2020-04-28 DIAGNOSIS — A09 Infectious gastroenteritis and colitis, unspecified: Secondary | ICD-10-CM | POA: Diagnosis not present

## 2020-04-28 DIAGNOSIS — Z7401 Bed confinement status: Secondary | ICD-10-CM | POA: Diagnosis not present

## 2020-04-28 DIAGNOSIS — E46 Unspecified protein-calorie malnutrition: Secondary | ICD-10-CM | POA: Diagnosis not present

## 2020-04-28 DIAGNOSIS — M6284 Sarcopenia: Secondary | ICD-10-CM | POA: Diagnosis not present

## 2020-04-28 DIAGNOSIS — R64 Cachexia: Secondary | ICD-10-CM | POA: Diagnosis not present

## 2020-04-28 DIAGNOSIS — M24561 Contracture, right knee: Secondary | ICD-10-CM | POA: Diagnosis not present

## 2020-04-28 DIAGNOSIS — R1310 Dysphagia, unspecified: Secondary | ICD-10-CM | POA: Diagnosis not present

## 2020-04-28 DIAGNOSIS — Z741 Need for assistance with personal care: Secondary | ICD-10-CM | POA: Diagnosis not present

## 2020-04-28 DIAGNOSIS — I10 Essential (primary) hypertension: Secondary | ICD-10-CM | POA: Diagnosis not present

## 2020-04-28 DIAGNOSIS — J690 Pneumonitis due to inhalation of food and vomit: Secondary | ICD-10-CM | POA: Diagnosis not present

## 2020-04-28 DIAGNOSIS — R32 Unspecified urinary incontinence: Secondary | ICD-10-CM | POA: Diagnosis not present

## 2020-04-28 DIAGNOSIS — G309 Alzheimer's disease, unspecified: Secondary | ICD-10-CM | POA: Diagnosis not present

## 2020-04-28 DIAGNOSIS — G9340 Encephalopathy, unspecified: Secondary | ICD-10-CM | POA: Diagnosis not present

## 2020-04-28 DIAGNOSIS — F0281 Dementia in other diseases classified elsewhere with behavioral disturbance: Secondary | ICD-10-CM | POA: Diagnosis not present

## 2020-04-28 DIAGNOSIS — M21379 Foot drop, unspecified foot: Secondary | ICD-10-CM | POA: Diagnosis not present

## 2020-04-28 DIAGNOSIS — A419 Sepsis, unspecified organism: Secondary | ICD-10-CM | POA: Diagnosis not present

## 2020-04-30 DIAGNOSIS — R1310 Dysphagia, unspecified: Secondary | ICD-10-CM | POA: Diagnosis not present

## 2020-04-30 DIAGNOSIS — J690 Pneumonitis due to inhalation of food and vomit: Secondary | ICD-10-CM | POA: Diagnosis not present

## 2020-04-30 DIAGNOSIS — F0281 Dementia in other diseases classified elsewhere with behavioral disturbance: Secondary | ICD-10-CM | POA: Diagnosis not present

## 2020-04-30 DIAGNOSIS — G9340 Encephalopathy, unspecified: Secondary | ICD-10-CM | POA: Diagnosis not present

## 2020-04-30 DIAGNOSIS — A419 Sepsis, unspecified organism: Secondary | ICD-10-CM | POA: Diagnosis not present

## 2020-04-30 DIAGNOSIS — G309 Alzheimer's disease, unspecified: Secondary | ICD-10-CM | POA: Diagnosis not present

## 2020-05-01 DIAGNOSIS — G309 Alzheimer's disease, unspecified: Secondary | ICD-10-CM | POA: Diagnosis not present

## 2020-05-01 DIAGNOSIS — F0281 Dementia in other diseases classified elsewhere with behavioral disturbance: Secondary | ICD-10-CM | POA: Diagnosis not present

## 2020-05-01 DIAGNOSIS — R1310 Dysphagia, unspecified: Secondary | ICD-10-CM | POA: Diagnosis not present

## 2020-05-01 DIAGNOSIS — A419 Sepsis, unspecified organism: Secondary | ICD-10-CM | POA: Diagnosis not present

## 2020-05-01 DIAGNOSIS — J690 Pneumonitis due to inhalation of food and vomit: Secondary | ICD-10-CM | POA: Diagnosis not present

## 2020-05-01 DIAGNOSIS — G9340 Encephalopathy, unspecified: Secondary | ICD-10-CM | POA: Diagnosis not present

## 2020-05-06 DIAGNOSIS — A419 Sepsis, unspecified organism: Secondary | ICD-10-CM | POA: Diagnosis not present

## 2020-05-06 DIAGNOSIS — G9340 Encephalopathy, unspecified: Secondary | ICD-10-CM | POA: Diagnosis not present

## 2020-05-06 DIAGNOSIS — R1310 Dysphagia, unspecified: Secondary | ICD-10-CM | POA: Diagnosis not present

## 2020-05-06 DIAGNOSIS — F0281 Dementia in other diseases classified elsewhere with behavioral disturbance: Secondary | ICD-10-CM | POA: Diagnosis not present

## 2020-05-06 DIAGNOSIS — J690 Pneumonitis due to inhalation of food and vomit: Secondary | ICD-10-CM | POA: Diagnosis not present

## 2020-05-06 DIAGNOSIS — G309 Alzheimer's disease, unspecified: Secondary | ICD-10-CM | POA: Diagnosis not present

## 2020-05-13 DIAGNOSIS — A419 Sepsis, unspecified organism: Secondary | ICD-10-CM | POA: Diagnosis not present

## 2020-05-13 DIAGNOSIS — F0281 Dementia in other diseases classified elsewhere with behavioral disturbance: Secondary | ICD-10-CM | POA: Diagnosis not present

## 2020-05-13 DIAGNOSIS — J690 Pneumonitis due to inhalation of food and vomit: Secondary | ICD-10-CM | POA: Diagnosis not present

## 2020-05-13 DIAGNOSIS — G9340 Encephalopathy, unspecified: Secondary | ICD-10-CM | POA: Diagnosis not present

## 2020-05-13 DIAGNOSIS — R1310 Dysphagia, unspecified: Secondary | ICD-10-CM | POA: Diagnosis not present

## 2020-05-13 DIAGNOSIS — G309 Alzheimer's disease, unspecified: Secondary | ICD-10-CM | POA: Diagnosis not present

## 2020-05-20 DIAGNOSIS — F0281 Dementia in other diseases classified elsewhere with behavioral disturbance: Secondary | ICD-10-CM | POA: Diagnosis not present

## 2020-05-20 DIAGNOSIS — G309 Alzheimer's disease, unspecified: Secondary | ICD-10-CM | POA: Diagnosis not present

## 2020-05-20 DIAGNOSIS — A419 Sepsis, unspecified organism: Secondary | ICD-10-CM | POA: Diagnosis not present

## 2020-05-20 DIAGNOSIS — R1310 Dysphagia, unspecified: Secondary | ICD-10-CM | POA: Diagnosis not present

## 2020-05-20 DIAGNOSIS — G9340 Encephalopathy, unspecified: Secondary | ICD-10-CM | POA: Diagnosis not present

## 2020-05-20 DIAGNOSIS — J690 Pneumonitis due to inhalation of food and vomit: Secondary | ICD-10-CM | POA: Diagnosis not present

## 2020-05-23 DIAGNOSIS — G9340 Encephalopathy, unspecified: Secondary | ICD-10-CM | POA: Diagnosis not present

## 2020-05-23 DIAGNOSIS — F0281 Dementia in other diseases classified elsewhere with behavioral disturbance: Secondary | ICD-10-CM | POA: Diagnosis not present

## 2020-05-23 DIAGNOSIS — G309 Alzheimer's disease, unspecified: Secondary | ICD-10-CM | POA: Diagnosis not present

## 2020-05-23 DIAGNOSIS — R1310 Dysphagia, unspecified: Secondary | ICD-10-CM | POA: Diagnosis not present

## 2020-05-23 DIAGNOSIS — J690 Pneumonitis due to inhalation of food and vomit: Secondary | ICD-10-CM | POA: Diagnosis not present

## 2020-05-23 DIAGNOSIS — A419 Sepsis, unspecified organism: Secondary | ICD-10-CM | POA: Diagnosis not present

## 2020-05-27 DIAGNOSIS — G9340 Encephalopathy, unspecified: Secondary | ICD-10-CM | POA: Diagnosis not present

## 2020-05-27 DIAGNOSIS — R1310 Dysphagia, unspecified: Secondary | ICD-10-CM | POA: Diagnosis not present

## 2020-05-27 DIAGNOSIS — A419 Sepsis, unspecified organism: Secondary | ICD-10-CM | POA: Diagnosis not present

## 2020-05-27 DIAGNOSIS — F0281 Dementia in other diseases classified elsewhere with behavioral disturbance: Secondary | ICD-10-CM | POA: Diagnosis not present

## 2020-05-27 DIAGNOSIS — J690 Pneumonitis due to inhalation of food and vomit: Secondary | ICD-10-CM | POA: Diagnosis not present

## 2020-05-27 DIAGNOSIS — G309 Alzheimer's disease, unspecified: Secondary | ICD-10-CM | POA: Diagnosis not present

## 2020-05-28 DIAGNOSIS — F0281 Dementia in other diseases classified elsewhere with behavioral disturbance: Secondary | ICD-10-CM | POA: Diagnosis not present

## 2020-05-28 DIAGNOSIS — G309 Alzheimer's disease, unspecified: Secondary | ICD-10-CM | POA: Diagnosis not present

## 2020-05-28 DIAGNOSIS — R1310 Dysphagia, unspecified: Secondary | ICD-10-CM | POA: Diagnosis not present

## 2020-05-28 DIAGNOSIS — G9340 Encephalopathy, unspecified: Secondary | ICD-10-CM | POA: Diagnosis not present

## 2020-05-28 DIAGNOSIS — A419 Sepsis, unspecified organism: Secondary | ICD-10-CM | POA: Diagnosis not present

## 2020-05-28 DIAGNOSIS — J690 Pneumonitis due to inhalation of food and vomit: Secondary | ICD-10-CM | POA: Diagnosis not present

## 2020-05-29 DIAGNOSIS — E119 Type 2 diabetes mellitus without complications: Secondary | ICD-10-CM | POA: Diagnosis not present

## 2020-05-29 DIAGNOSIS — M6284 Sarcopenia: Secondary | ICD-10-CM | POA: Diagnosis not present

## 2020-05-29 DIAGNOSIS — R32 Unspecified urinary incontinence: Secondary | ICD-10-CM | POA: Diagnosis not present

## 2020-05-29 DIAGNOSIS — M24562 Contracture, left knee: Secondary | ICD-10-CM | POA: Diagnosis not present

## 2020-05-29 DIAGNOSIS — A419 Sepsis, unspecified organism: Secondary | ICD-10-CM | POA: Diagnosis not present

## 2020-05-29 DIAGNOSIS — I10 Essential (primary) hypertension: Secondary | ICD-10-CM | POA: Diagnosis not present

## 2020-05-29 DIAGNOSIS — J690 Pneumonitis due to inhalation of food and vomit: Secondary | ICD-10-CM | POA: Diagnosis not present

## 2020-05-29 DIAGNOSIS — G9340 Encephalopathy, unspecified: Secondary | ICD-10-CM | POA: Diagnosis not present

## 2020-05-29 DIAGNOSIS — E46 Unspecified protein-calorie malnutrition: Secondary | ICD-10-CM | POA: Diagnosis not present

## 2020-05-29 DIAGNOSIS — M24561 Contracture, right knee: Secondary | ICD-10-CM | POA: Diagnosis not present

## 2020-05-29 DIAGNOSIS — M24542 Contracture, left hand: Secondary | ICD-10-CM | POA: Diagnosis not present

## 2020-05-29 DIAGNOSIS — A09 Infectious gastroenteritis and colitis, unspecified: Secondary | ICD-10-CM | POA: Diagnosis not present

## 2020-05-29 DIAGNOSIS — Z741 Need for assistance with personal care: Secondary | ICD-10-CM | POA: Diagnosis not present

## 2020-05-29 DIAGNOSIS — G309 Alzheimer's disease, unspecified: Secondary | ICD-10-CM | POA: Diagnosis not present

## 2020-05-29 DIAGNOSIS — R64 Cachexia: Secondary | ICD-10-CM | POA: Diagnosis not present

## 2020-05-29 DIAGNOSIS — L89152 Pressure ulcer of sacral region, stage 2: Secondary | ICD-10-CM | POA: Diagnosis not present

## 2020-05-29 DIAGNOSIS — M21379 Foot drop, unspecified foot: Secondary | ICD-10-CM | POA: Diagnosis not present

## 2020-05-29 DIAGNOSIS — Z7401 Bed confinement status: Secondary | ICD-10-CM | POA: Diagnosis not present

## 2020-05-29 DIAGNOSIS — R1310 Dysphagia, unspecified: Secondary | ICD-10-CM | POA: Diagnosis not present

## 2020-05-29 DIAGNOSIS — F0281 Dementia in other diseases classified elsewhere with behavioral disturbance: Secondary | ICD-10-CM | POA: Diagnosis not present

## 2020-06-06 DIAGNOSIS — J690 Pneumonitis due to inhalation of food and vomit: Secondary | ICD-10-CM | POA: Diagnosis not present

## 2020-06-06 DIAGNOSIS — F0281 Dementia in other diseases classified elsewhere with behavioral disturbance: Secondary | ICD-10-CM | POA: Diagnosis not present

## 2020-06-06 DIAGNOSIS — G309 Alzheimer's disease, unspecified: Secondary | ICD-10-CM | POA: Diagnosis not present

## 2020-06-06 DIAGNOSIS — A419 Sepsis, unspecified organism: Secondary | ICD-10-CM | POA: Diagnosis not present

## 2020-06-06 DIAGNOSIS — G9340 Encephalopathy, unspecified: Secondary | ICD-10-CM | POA: Diagnosis not present

## 2020-06-06 DIAGNOSIS — R1310 Dysphagia, unspecified: Secondary | ICD-10-CM | POA: Diagnosis not present

## 2020-06-10 DIAGNOSIS — F0281 Dementia in other diseases classified elsewhere with behavioral disturbance: Secondary | ICD-10-CM | POA: Diagnosis not present

## 2020-06-10 DIAGNOSIS — J690 Pneumonitis due to inhalation of food and vomit: Secondary | ICD-10-CM | POA: Diagnosis not present

## 2020-06-10 DIAGNOSIS — G309 Alzheimer's disease, unspecified: Secondary | ICD-10-CM | POA: Diagnosis not present

## 2020-06-10 DIAGNOSIS — R1310 Dysphagia, unspecified: Secondary | ICD-10-CM | POA: Diagnosis not present

## 2020-06-10 DIAGNOSIS — A419 Sepsis, unspecified organism: Secondary | ICD-10-CM | POA: Diagnosis not present

## 2020-06-10 DIAGNOSIS — G9340 Encephalopathy, unspecified: Secondary | ICD-10-CM | POA: Diagnosis not present

## 2020-06-17 DIAGNOSIS — G309 Alzheimer's disease, unspecified: Secondary | ICD-10-CM | POA: Diagnosis not present

## 2020-06-17 DIAGNOSIS — F0281 Dementia in other diseases classified elsewhere with behavioral disturbance: Secondary | ICD-10-CM | POA: Diagnosis not present

## 2020-06-17 DIAGNOSIS — R1310 Dysphagia, unspecified: Secondary | ICD-10-CM | POA: Diagnosis not present

## 2020-06-17 DIAGNOSIS — A419 Sepsis, unspecified organism: Secondary | ICD-10-CM | POA: Diagnosis not present

## 2020-06-17 DIAGNOSIS — J690 Pneumonitis due to inhalation of food and vomit: Secondary | ICD-10-CM | POA: Diagnosis not present

## 2020-06-17 DIAGNOSIS — G9340 Encephalopathy, unspecified: Secondary | ICD-10-CM | POA: Diagnosis not present

## 2020-06-18 DIAGNOSIS — R1310 Dysphagia, unspecified: Secondary | ICD-10-CM | POA: Diagnosis not present

## 2020-06-18 DIAGNOSIS — J690 Pneumonitis due to inhalation of food and vomit: Secondary | ICD-10-CM | POA: Diagnosis not present

## 2020-06-18 DIAGNOSIS — G309 Alzheimer's disease, unspecified: Secondary | ICD-10-CM | POA: Diagnosis not present

## 2020-06-18 DIAGNOSIS — G9340 Encephalopathy, unspecified: Secondary | ICD-10-CM | POA: Diagnosis not present

## 2020-06-18 DIAGNOSIS — F0281 Dementia in other diseases classified elsewhere with behavioral disturbance: Secondary | ICD-10-CM | POA: Diagnosis not present

## 2020-06-18 DIAGNOSIS — A419 Sepsis, unspecified organism: Secondary | ICD-10-CM | POA: Diagnosis not present

## 2020-06-19 DIAGNOSIS — J690 Pneumonitis due to inhalation of food and vomit: Secondary | ICD-10-CM | POA: Diagnosis not present

## 2020-06-19 DIAGNOSIS — G9340 Encephalopathy, unspecified: Secondary | ICD-10-CM | POA: Diagnosis not present

## 2020-06-19 DIAGNOSIS — A419 Sepsis, unspecified organism: Secondary | ICD-10-CM | POA: Diagnosis not present

## 2020-06-19 DIAGNOSIS — R1310 Dysphagia, unspecified: Secondary | ICD-10-CM | POA: Diagnosis not present

## 2020-06-19 DIAGNOSIS — G309 Alzheimer's disease, unspecified: Secondary | ICD-10-CM | POA: Diagnosis not present

## 2020-06-19 DIAGNOSIS — F0281 Dementia in other diseases classified elsewhere with behavioral disturbance: Secondary | ICD-10-CM | POA: Diagnosis not present

## 2020-06-21 DIAGNOSIS — G9340 Encephalopathy, unspecified: Secondary | ICD-10-CM | POA: Diagnosis not present

## 2020-06-21 DIAGNOSIS — R1310 Dysphagia, unspecified: Secondary | ICD-10-CM | POA: Diagnosis not present

## 2020-06-21 DIAGNOSIS — A419 Sepsis, unspecified organism: Secondary | ICD-10-CM | POA: Diagnosis not present

## 2020-06-21 DIAGNOSIS — J690 Pneumonitis due to inhalation of food and vomit: Secondary | ICD-10-CM | POA: Diagnosis not present

## 2020-06-21 DIAGNOSIS — F0281 Dementia in other diseases classified elsewhere with behavioral disturbance: Secondary | ICD-10-CM | POA: Diagnosis not present

## 2020-06-21 DIAGNOSIS — G309 Alzheimer's disease, unspecified: Secondary | ICD-10-CM | POA: Diagnosis not present

## 2020-06-24 DIAGNOSIS — R1310 Dysphagia, unspecified: Secondary | ICD-10-CM | POA: Diagnosis not present

## 2020-06-24 DIAGNOSIS — G309 Alzheimer's disease, unspecified: Secondary | ICD-10-CM | POA: Diagnosis not present

## 2020-06-24 DIAGNOSIS — F0281 Dementia in other diseases classified elsewhere with behavioral disturbance: Secondary | ICD-10-CM | POA: Diagnosis not present

## 2020-06-24 DIAGNOSIS — G9340 Encephalopathy, unspecified: Secondary | ICD-10-CM | POA: Diagnosis not present

## 2020-06-24 DIAGNOSIS — A419 Sepsis, unspecified organism: Secondary | ICD-10-CM | POA: Diagnosis not present

## 2020-06-24 DIAGNOSIS — J690 Pneumonitis due to inhalation of food and vomit: Secondary | ICD-10-CM | POA: Diagnosis not present

## 2020-06-25 DIAGNOSIS — F0281 Dementia in other diseases classified elsewhere with behavioral disturbance: Secondary | ICD-10-CM | POA: Diagnosis not present

## 2020-06-25 DIAGNOSIS — G9340 Encephalopathy, unspecified: Secondary | ICD-10-CM | POA: Diagnosis not present

## 2020-06-25 DIAGNOSIS — G309 Alzheimer's disease, unspecified: Secondary | ICD-10-CM | POA: Diagnosis not present

## 2020-06-25 DIAGNOSIS — J690 Pneumonitis due to inhalation of food and vomit: Secondary | ICD-10-CM | POA: Diagnosis not present

## 2020-06-25 DIAGNOSIS — A419 Sepsis, unspecified organism: Secondary | ICD-10-CM | POA: Diagnosis not present

## 2020-06-25 DIAGNOSIS — R1310 Dysphagia, unspecified: Secondary | ICD-10-CM | POA: Diagnosis not present

## 2020-06-26 DIAGNOSIS — F0281 Dementia in other diseases classified elsewhere with behavioral disturbance: Secondary | ICD-10-CM | POA: Diagnosis not present

## 2020-06-26 DIAGNOSIS — A419 Sepsis, unspecified organism: Secondary | ICD-10-CM | POA: Diagnosis not present

## 2020-06-26 DIAGNOSIS — J690 Pneumonitis due to inhalation of food and vomit: Secondary | ICD-10-CM | POA: Diagnosis not present

## 2020-06-26 DIAGNOSIS — G9340 Encephalopathy, unspecified: Secondary | ICD-10-CM | POA: Diagnosis not present

## 2020-06-26 DIAGNOSIS — G309 Alzheimer's disease, unspecified: Secondary | ICD-10-CM | POA: Diagnosis not present

## 2020-06-26 DIAGNOSIS — R1310 Dysphagia, unspecified: Secondary | ICD-10-CM | POA: Diagnosis not present

## 2020-06-27 DIAGNOSIS — G309 Alzheimer's disease, unspecified: Secondary | ICD-10-CM | POA: Diagnosis not present

## 2020-06-27 DIAGNOSIS — A419 Sepsis, unspecified organism: Secondary | ICD-10-CM | POA: Diagnosis not present

## 2020-06-27 DIAGNOSIS — R1310 Dysphagia, unspecified: Secondary | ICD-10-CM | POA: Diagnosis not present

## 2020-06-27 DIAGNOSIS — G9340 Encephalopathy, unspecified: Secondary | ICD-10-CM | POA: Diagnosis not present

## 2020-06-27 DIAGNOSIS — F0281 Dementia in other diseases classified elsewhere with behavioral disturbance: Secondary | ICD-10-CM | POA: Diagnosis not present

## 2020-06-27 DIAGNOSIS — J690 Pneumonitis due to inhalation of food and vomit: Secondary | ICD-10-CM | POA: Diagnosis not present

## 2020-06-28 DIAGNOSIS — M6284 Sarcopenia: Secondary | ICD-10-CM | POA: Diagnosis not present

## 2020-06-28 DIAGNOSIS — Z7401 Bed confinement status: Secondary | ICD-10-CM | POA: Diagnosis not present

## 2020-06-28 DIAGNOSIS — M24561 Contracture, right knee: Secondary | ICD-10-CM | POA: Diagnosis not present

## 2020-06-28 DIAGNOSIS — M24562 Contracture, left knee: Secondary | ICD-10-CM | POA: Diagnosis not present

## 2020-06-28 DIAGNOSIS — A09 Infectious gastroenteritis and colitis, unspecified: Secondary | ICD-10-CM | POA: Diagnosis not present

## 2020-06-28 DIAGNOSIS — E46 Unspecified protein-calorie malnutrition: Secondary | ICD-10-CM | POA: Diagnosis not present

## 2020-06-28 DIAGNOSIS — M21379 Foot drop, unspecified foot: Secondary | ICD-10-CM | POA: Diagnosis not present

## 2020-06-28 DIAGNOSIS — J690 Pneumonitis due to inhalation of food and vomit: Secondary | ICD-10-CM | POA: Diagnosis not present

## 2020-06-28 DIAGNOSIS — I10 Essential (primary) hypertension: Secondary | ICD-10-CM | POA: Diagnosis not present

## 2020-06-28 DIAGNOSIS — E119 Type 2 diabetes mellitus without complications: Secondary | ICD-10-CM | POA: Diagnosis not present

## 2020-06-28 DIAGNOSIS — R64 Cachexia: Secondary | ICD-10-CM | POA: Diagnosis not present

## 2020-06-28 DIAGNOSIS — R1310 Dysphagia, unspecified: Secondary | ICD-10-CM | POA: Diagnosis not present

## 2020-06-28 DIAGNOSIS — L89152 Pressure ulcer of sacral region, stage 2: Secondary | ICD-10-CM | POA: Diagnosis not present

## 2020-06-28 DIAGNOSIS — R32 Unspecified urinary incontinence: Secondary | ICD-10-CM | POA: Diagnosis not present

## 2020-06-28 DIAGNOSIS — G9340 Encephalopathy, unspecified: Secondary | ICD-10-CM | POA: Diagnosis not present

## 2020-06-28 DIAGNOSIS — M24522 Contracture, left elbow: Secondary | ICD-10-CM | POA: Diagnosis not present

## 2020-06-28 DIAGNOSIS — M24542 Contracture, left hand: Secondary | ICD-10-CM | POA: Diagnosis not present

## 2020-06-28 DIAGNOSIS — A419 Sepsis, unspecified organism: Secondary | ICD-10-CM | POA: Diagnosis not present

## 2020-06-28 DIAGNOSIS — G309 Alzheimer's disease, unspecified: Secondary | ICD-10-CM | POA: Diagnosis not present

## 2020-06-28 DIAGNOSIS — Z741 Need for assistance with personal care: Secondary | ICD-10-CM | POA: Diagnosis not present

## 2020-06-28 DIAGNOSIS — F0281 Dementia in other diseases classified elsewhere with behavioral disturbance: Secondary | ICD-10-CM | POA: Diagnosis not present

## 2020-06-29 DIAGNOSIS — F0281 Dementia in other diseases classified elsewhere with behavioral disturbance: Secondary | ICD-10-CM | POA: Diagnosis not present

## 2020-06-29 DIAGNOSIS — R1310 Dysphagia, unspecified: Secondary | ICD-10-CM | POA: Diagnosis not present

## 2020-06-29 DIAGNOSIS — A419 Sepsis, unspecified organism: Secondary | ICD-10-CM | POA: Diagnosis not present

## 2020-06-29 DIAGNOSIS — J690 Pneumonitis due to inhalation of food and vomit: Secondary | ICD-10-CM | POA: Diagnosis not present

## 2020-06-29 DIAGNOSIS — G309 Alzheimer's disease, unspecified: Secondary | ICD-10-CM | POA: Diagnosis not present

## 2020-06-29 DIAGNOSIS — G9340 Encephalopathy, unspecified: Secondary | ICD-10-CM | POA: Diagnosis not present

## 2020-07-29 DEATH — deceased
